# Patient Record
Sex: Female | Born: 1965 | Race: White | Hispanic: No | Marital: Married | Smoking: Never smoker
Health system: Southern US, Community
[De-identification: ages and names within clinical notes are randomized; demographics above are authoritative.]

## PROBLEM LIST (undated history)

## (undated) DIAGNOSIS — D6851 Activated protein C resistance: Secondary | ICD-10-CM

## (undated) DIAGNOSIS — R0602 Shortness of breath: Secondary | ICD-10-CM

## (undated) DIAGNOSIS — E079 Disorder of thyroid, unspecified: Secondary | ICD-10-CM

## (undated) DIAGNOSIS — E785 Hyperlipidemia, unspecified: Secondary | ICD-10-CM

## (undated) DIAGNOSIS — T7840XA Allergy, unspecified, initial encounter: Secondary | ICD-10-CM

## (undated) DIAGNOSIS — D689 Coagulation defect, unspecified: Secondary | ICD-10-CM

## (undated) DIAGNOSIS — I82409 Acute embolism and thrombosis of unspecified deep veins of unspecified lower extremity: Secondary | ICD-10-CM

## (undated) DIAGNOSIS — E538 Deficiency of other specified B group vitamins: Secondary | ICD-10-CM

## (undated) DIAGNOSIS — E039 Hypothyroidism, unspecified: Secondary | ICD-10-CM

## (undated) DIAGNOSIS — E559 Vitamin D deficiency, unspecified: Secondary | ICD-10-CM

## (undated) DIAGNOSIS — I82402 Acute embolism and thrombosis of unspecified deep veins of left lower extremity: Secondary | ICD-10-CM

## (undated) DIAGNOSIS — R7303 Prediabetes: Secondary | ICD-10-CM

## (undated) DIAGNOSIS — K760 Fatty (change of) liver, not elsewhere classified: Secondary | ICD-10-CM

## (undated) DIAGNOSIS — Z86718 Personal history of other venous thrombosis and embolism: Secondary | ICD-10-CM

## (undated) DIAGNOSIS — G500 Trigeminal neuralgia: Secondary | ICD-10-CM

## (undated) HISTORY — DX: Hypothyroidism, unspecified: E03.9

## (undated) HISTORY — DX: Acute embolism and thrombosis of unspecified deep veins of left lower extremity: I82.402

## (undated) HISTORY — DX: Deficiency of other specified B group vitamins: E53.8

## (undated) HISTORY — DX: Allergy, unspecified, initial encounter: T78.40XA

## (undated) HISTORY — DX: Trigeminal neuralgia: G50.0

## (undated) HISTORY — DX: Coagulation defect, unspecified: D68.9

## (undated) HISTORY — DX: Personal history of other venous thrombosis and embolism: Z86.718

## (undated) HISTORY — DX: Vitamin D deficiency, unspecified: E55.9

## (undated) HISTORY — DX: Hyperlipidemia, unspecified: E78.5

## (undated) HISTORY — DX: Disorder of thyroid, unspecified: E07.9

## (undated) HISTORY — DX: Prediabetes: R73.03

## (undated) HISTORY — DX: Fatty (change of) liver, not elsewhere classified: K76.0

## (undated) HISTORY — DX: Shortness of breath: R06.02

---

## 2009-05-11 ENCOUNTER — Emergency Department (HOSPITAL_COMMUNITY): Admission: EM | Admit: 2009-05-11 | Discharge: 2009-05-11 | Payer: Self-pay | Admitting: Emergency Medicine

## 2009-08-07 ENCOUNTER — Emergency Department (HOSPITAL_COMMUNITY): Admission: EM | Admit: 2009-08-07 | Discharge: 2009-08-07 | Payer: Self-pay | Admitting: Family Medicine

## 2010-01-20 ENCOUNTER — Ambulatory Visit: Payer: Self-pay | Admitting: Sports Medicine

## 2010-01-20 DIAGNOSIS — M79609 Pain in unspecified limb: Secondary | ICD-10-CM

## 2010-01-20 DIAGNOSIS — M7750 Other enthesopathy of unspecified foot: Secondary | ICD-10-CM | POA: Insufficient documentation

## 2010-02-18 ENCOUNTER — Emergency Department (HOSPITAL_COMMUNITY): Admission: EM | Admit: 2010-02-18 | Discharge: 2010-02-18 | Payer: Self-pay | Admitting: Family Medicine

## 2010-03-19 ENCOUNTER — Encounter (INDEPENDENT_AMBULATORY_CARE_PROVIDER_SITE_OTHER): Payer: Self-pay | Admitting: Emergency Medicine

## 2010-03-19 ENCOUNTER — Ambulatory Visit: Payer: Self-pay | Admitting: Surgery

## 2010-03-19 ENCOUNTER — Observation Stay (HOSPITAL_COMMUNITY): Admission: EM | Admit: 2010-03-19 | Discharge: 2010-03-19 | Payer: Self-pay | Admitting: Emergency Medicine

## 2010-03-20 ENCOUNTER — Emergency Department (HOSPITAL_COMMUNITY): Admission: EM | Admit: 2010-03-20 | Discharge: 2010-03-20 | Payer: Self-pay | Admitting: Emergency Medicine

## 2010-03-25 ENCOUNTER — Emergency Department (HOSPITAL_COMMUNITY): Admission: EM | Admit: 2010-03-25 | Discharge: 2010-03-25 | Payer: Self-pay | Admitting: Emergency Medicine

## 2010-03-25 ENCOUNTER — Encounter: Admission: RE | Admit: 2010-03-25 | Discharge: 2010-03-25 | Payer: Self-pay | Admitting: Internal Medicine

## 2010-04-07 ENCOUNTER — Encounter: Admission: RE | Admit: 2010-04-07 | Discharge: 2010-04-07 | Payer: Self-pay | Admitting: Internal Medicine

## 2010-05-13 ENCOUNTER — Encounter: Admission: RE | Admit: 2010-05-13 | Discharge: 2010-05-13 | Payer: Self-pay | Admitting: Dermatology

## 2010-05-20 ENCOUNTER — Encounter: Admission: RE | Admit: 2010-05-20 | Discharge: 2010-05-20 | Payer: Self-pay | Admitting: Dermatology

## 2010-09-30 ENCOUNTER — Ambulatory Visit: Payer: Self-pay | Admitting: Oncology

## 2010-10-19 ENCOUNTER — Ambulatory Visit
Admission: RE | Admit: 2010-10-19 | Discharge: 2010-10-19 | Payer: Self-pay | Source: Home / Self Care | Attending: Oncology | Admitting: Oncology

## 2010-10-19 ENCOUNTER — Ambulatory Visit (HOSPITAL_COMMUNITY)
Admission: RE | Admit: 2010-10-19 | Discharge: 2010-10-19 | Payer: Self-pay | Source: Home / Self Care | Attending: Oncology | Admitting: Oncology

## 2010-10-25 NOTE — Assessment & Plan Note (Signed)
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 Pcn  Physical Exam  General:  Well-developed,well-nourished,in no acute distress; alert,appropriate and cooperative throughout examination Msk:  Left foot shows collapse of transverse arch There is a large callus distal to MT 2,3 and 4 on left and not on RT Left forefoot wider than RT RT shows minimal transverse arch collapse mod high arches otherwise  TTP over callus area  running gait is very good with forefoot strike   Impression & Recommendations:  Problem # 1:  FOOT PAIN, LEFT (ICD-729.5) This is chronic for 1 year I think the treatments ahve all been reasonable and she has both good shoes and good orthotics  needs to cont icing  leg lengths were equal and strength good  Problem # 2:  METATARSALGIA (ICD-726.70) The challenge has been finding a MT padding position that will relieve her pain  with the forefoot strike I think the MT cookies don't provide enough lift and I think the current position is too far forward  trial for 1 mo with std MT pad w focus on 2 and 3 MT shafts place more proximally to change angle of forefoot strike  add MT pads to other shoes  REck in 1 month but if this strategy works she needs to [employer] and shoes are  fine

## 2010-12-08 ENCOUNTER — Encounter (HOSPITAL_BASED_OUTPATIENT_CLINIC_OR_DEPARTMENT_OTHER): Payer: 59 | Admitting: Oncology

## 2010-12-08 ENCOUNTER — Other Ambulatory Visit: Payer: Self-pay | Admitting: Oncology

## 2010-12-08 DIAGNOSIS — I824Z9 Acute embolism and thrombosis of unspecified deep veins of unspecified distal lower extremity: Secondary | ICD-10-CM

## 2010-12-12 LAB — POCT I-STAT, CHEM 8
Calcium, Ion: 1.18 mmol/L (ref 1.12–1.32)
Chloride: 104 mEq/L (ref 96–112)
Potassium: 4.4 mEq/L (ref 3.5–5.1)
Sodium: 139 mEq/L (ref 135–145)
TCO2: 28 mmol/L (ref 0–100)

## 2010-12-12 LAB — TSH: TSH: 1.844 u[IU]/mL (ref 0.350–4.500)

## 2010-12-13 LAB — D-DIMER, QUANTITATIVE: D-Dimer, Quant: 0.69 ug/mL-FEU — ABNORMAL HIGH (ref 0.00–0.48)

## 2010-12-13 LAB — PROTEIN S, TOTAL: Protein S Ag, Total: 113 % (ref 70–140)

## 2010-12-13 LAB — PROTEIN C ACTIVITY: Protein C Activity: 188 % — ABNORMAL HIGH (ref 75–133)

## 2010-12-13 LAB — ANTITHROMBIN III: AntiThromb III Func: 118 % (ref 76–126)

## 2010-12-13 LAB — PROTEIN S ACTIVITY: Protein S Activity: 99 % (ref 69–129)

## 2010-12-16 ENCOUNTER — Encounter (HOSPITAL_BASED_OUTPATIENT_CLINIC_OR_DEPARTMENT_OTHER): Payer: 59 | Admitting: Oncology

## 2010-12-16 DIAGNOSIS — D6859 Other primary thrombophilia: Secondary | ICD-10-CM

## 2010-12-16 DIAGNOSIS — I824Z9 Acute embolism and thrombosis of unspecified deep veins of unspecified distal lower extremity: Secondary | ICD-10-CM

## 2010-12-27 ENCOUNTER — Emergency Department (HOSPITAL_COMMUNITY)
Admission: EM | Admit: 2010-12-27 | Discharge: 2010-12-28 | Disposition: A | Discharge status: Home / Self Care | Payer: 59 | Attending: Emergency Medicine | Admitting: Emergency Medicine

## 2010-12-27 DIAGNOSIS — Z86718 Personal history of other venous thrombosis and embolism: Secondary | ICD-10-CM | POA: Insufficient documentation

## 2010-12-27 DIAGNOSIS — R109 Unspecified abdominal pain: Secondary | ICD-10-CM | POA: Insufficient documentation

## 2010-12-27 DIAGNOSIS — D6859 Other primary thrombophilia: Secondary | ICD-10-CM | POA: Insufficient documentation

## 2010-12-27 DIAGNOSIS — R300 Dysuria: Secondary | ICD-10-CM | POA: Insufficient documentation

## 2010-12-27 LAB — BASIC METABOLIC PANEL WITH GFR
BUN: 17 mg/dL (ref 6–23)
CO2: 28 meq/L (ref 19–32)
Calcium: 9.8 mg/dL (ref 8.4–10.5)
Chloride: 103 meq/L (ref 96–112)
Creatinine, Ser: 0.91 mg/dL (ref 0.4–1.2)
GFR calc non Af Amer: >60 mL/min
Glucose, Bld: 100 mg/dL — ABNORMAL HIGH (ref 70–99)
Potassium: 4 meq/L (ref 3.5–5.1)
Sodium: 139 meq/L (ref 135–145)

## 2010-12-27 LAB — DIFFERENTIAL
Basophils Absolute: 0 K/uL (ref 0.0–0.1)
Basophils Relative: 0 % (ref 0–1)
Eosinophils Absolute: 0.3 K/uL (ref 0.0–0.7)
Eosinophils Relative: 3 % (ref 0–5)
Lymphocytes Relative: 40 % (ref 12–46)
Lymphs Abs: 3.6 K/uL (ref 0.7–4.0)
Monocytes Absolute: 0.5 K/uL (ref 0.1–1.0)
Monocytes Relative: 6 % (ref 3–12)
Neutro Abs: 4.5 K/uL (ref 1.7–7.7)
Neutrophils Relative %: 50 % (ref 43–77)

## 2010-12-27 LAB — CBC
MCH: 29.3 pg (ref 26.0–34.0)
MCHC: 34.2 g/dL (ref 30.0–36.0)
MCV: 85.8 fL (ref 78.0–100.0)
Platelets: 209 10*3/uL (ref 150–400)
RBC: 4.09 MIL/uL (ref 3.87–5.11)

## 2010-12-27 LAB — POCT PREGNANCY, URINE: Preg Test, Ur: NEGATIVE

## 2010-12-28 ENCOUNTER — Emergency Department (HOSPITAL_COMMUNITY): Payer: 59

## 2010-12-28 LAB — POCT URINALYSIS DIP (DEVICE)
Nitrite: NEGATIVE
Specific Gravity, Urine: <=1.005 (ref 1.005–1.030)
Urobilinogen, UA: 0.2 mg/dL (ref 0.0–1.0)

## 2010-12-28 LAB — URINALYSIS, ROUTINE W REFLEX MICROSCOPIC
Bilirubin Urine: NEGATIVE
Protein, ur: NEGATIVE mg/dL
Specific Gravity, Urine: 1.01 (ref 1.005–1.030)
Urobilinogen, UA: 0.2 mg/dL (ref 0.0–1.0)
pH: 6 (ref 5.0–8.0)

## 2010-12-28 LAB — POCT PREGNANCY, URINE: Preg Test, Ur: NEGATIVE

## 2010-12-31 LAB — URINALYSIS, ROUTINE W REFLEX MICROSCOPIC
Ketones, ur: NEGATIVE mg/dL
Nitrite: POSITIVE — AB
Protein, ur: NEGATIVE mg/dL
Urobilinogen, UA: 0.2 mg/dL (ref 0.0–1.0)
pH: 6 (ref 5.0–8.0)

## 2010-12-31 LAB — URINE MICROSCOPIC-ADD ON

## 2010-12-31 LAB — URINE CULTURE: Colony Count: 60000

## 2011-02-17 ENCOUNTER — Ambulatory Visit
Admission: RE | Admit: 2011-02-17 | Discharge: 2011-02-17 | Disposition: A | Discharge status: Home / Self Care | Payer: 59 | Source: Ambulatory Visit | Attending: Internal Medicine | Admitting: Internal Medicine

## 2011-02-17 ENCOUNTER — Other Ambulatory Visit: Payer: Self-pay | Admitting: Internal Medicine

## 2011-02-17 DIAGNOSIS — R52 Pain, unspecified: Secondary | ICD-10-CM

## 2011-04-09 ENCOUNTER — Emergency Department (HOSPITAL_COMMUNITY)
Admission: EM | Admit: 2011-04-09 | Discharge: 2011-04-10 | Disposition: A | Discharge status: Home / Self Care | Payer: 59 | Attending: Emergency Medicine | Admitting: Emergency Medicine

## 2011-04-09 DIAGNOSIS — D6859 Other primary thrombophilia: Secondary | ICD-10-CM | POA: Insufficient documentation

## 2011-04-09 DIAGNOSIS — I824Z9 Acute embolism and thrombosis of unspecified deep veins of unspecified distal lower extremity: Secondary | ICD-10-CM | POA: Insufficient documentation

## 2011-04-09 DIAGNOSIS — Z86718 Personal history of other venous thrombosis and embolism: Secondary | ICD-10-CM | POA: Insufficient documentation

## 2011-04-09 DIAGNOSIS — R609 Edema, unspecified: Secondary | ICD-10-CM | POA: Insufficient documentation

## 2011-04-09 DIAGNOSIS — M79609 Pain in unspecified limb: Secondary | ICD-10-CM | POA: Insufficient documentation

## 2011-04-10 ENCOUNTER — Ambulatory Visit (HOSPITAL_COMMUNITY)
Admission: RE | Admit: 2011-04-10 | Discharge: 2011-04-10 | Disposition: A | Discharge status: Home / Self Care | Payer: 59 | Source: Ambulatory Visit | Attending: Emergency Medicine | Admitting: Emergency Medicine

## 2011-04-10 DIAGNOSIS — M79609 Pain in unspecified limb: Secondary | ICD-10-CM

## 2011-09-13 ENCOUNTER — Telehealth: Payer: Self-pay | Admitting: *Deleted

## 2011-09-13 NOTE — Telephone Encounter (Signed)
Per Dr. Gaylyn Rong,  Pt has no indications from his stand point to be excused from jury duty.   Called pt back and suggested she take breaks as permitted and can move/stretch her legs while sitting in chair if needed.  She verbalized understanding.

## 2011-09-13 NOTE — Telephone Encounter (Signed)
Pt left VM asking if ok for her to sit on Jury Duty all day considering history of blood clot last year?   Asks if Dr. Gaylyn Rong does not feel it is safe,  Can she have a letter to excuse her from [employer]?  Note forwarded to Dr. Gaylyn Rong for reply.

## 2011-11-16 ENCOUNTER — Encounter (HOSPITAL_COMMUNITY): Payer: Self-pay | Admitting: *Deleted

## 2011-11-16 ENCOUNTER — Emergency Department (HOSPITAL_COMMUNITY)
Admission: EM | Admit: 2011-11-16 | Discharge: 2011-11-16 | Disposition: A | Discharge status: Home / Self Care | Payer: 59 | Attending: Emergency Medicine | Admitting: Emergency Medicine

## 2011-11-16 DIAGNOSIS — Z86718 Personal history of other venous thrombosis and embolism: Secondary | ICD-10-CM | POA: Insufficient documentation

## 2011-11-16 DIAGNOSIS — D6859 Other primary thrombophilia: Secondary | ICD-10-CM | POA: Insufficient documentation

## 2011-11-16 DIAGNOSIS — M79609 Pain in unspecified limb: Secondary | ICD-10-CM | POA: Insufficient documentation

## 2011-11-16 DIAGNOSIS — M79606 Pain in leg, unspecified: Secondary | ICD-10-CM

## 2011-11-16 HISTORY — DX: Activated protein C resistance: D68.51

## 2011-11-16 HISTORY — DX: Acute embolism and thrombosis of unspecified deep veins of unspecified lower extremity: I82.409

## 2011-11-16 LAB — POCT I-STAT, CHEM 8
BUN: 17 mg/dL (ref 6–23)
Hemoglobin: 11.9 g/dL — ABNORMAL LOW (ref 12.0–15.0)
Potassium: 4.2 mEq/L (ref 3.5–5.1)
Sodium: 144 mEq/L (ref 135–145)
TCO2: 28 mmol/L (ref 0–100)

## 2011-11-16 LAB — DIFFERENTIAL
Basophils Absolute: 0 10*3/uL (ref 0.0–0.1)
Eosinophils Absolute: 0.2 10*3/uL (ref 0.0–0.7)
Eosinophils Relative: 2 % (ref 0–5)
Lymphocytes Relative: 44 % (ref 12–46)
Neutrophils Relative %: 47 % (ref 43–77)

## 2011-11-16 LAB — CBC
MCV: 86.2 fL (ref 78.0–100.0)
Platelets: 225 10*3/uL (ref 150–400)
RBC: 3.99 MIL/uL (ref 3.87–5.11)
RDW: 14.2 % (ref 11.5–15.5)
WBC: 7 10*3/uL (ref 4.0–10.5)

## 2011-11-16 MED ORDER — ENOXAPARIN SODIUM 100 MG/ML ~~LOC~~ SOLN
100.0000 mg | SUBCUTANEOUS | Status: AC
  Administered 2011-11-16: 100 mg via SUBCUTANEOUS
  Filled 2011-11-16: qty 1

## 2011-11-16 MED ORDER — ENOXAPARIN SODIUM 150 MG/ML ~~LOC~~ SOLN: 1.0000 mg/kg | Freq: Once | SUBCUTANEOUS | Status: DC

## 2011-11-16 NOTE — ED Notes (Signed)
Pt with hx of DVT is here for pain behind left knee which feels just like when she had DVT in the past.  Pt denies any swelling to left or sob or cp.  This has been increasing for the past several days

## 2011-11-16 NOTE — ED Notes (Signed)
Secretary notified me that no venous dopplers can be done after 7

## 2011-11-16 NOTE — ED Provider Notes (Signed)
History     CSN: [id number]  Arrival date & time 11/16/11  1845   First MD Initiated Contact with Patient 11/16/11 2037      Chief Complaint  Patient presents with  . DVT     HPI  History provided by the patient. Patient is a 46 year old female with past history of DVT who presents with complaints of left lower extremity pain for the past 2 days. Patient reports having pain in the popliteal fossa area of her left leg feels similar to previous DVT 1-1/2 years ago. Pain is described as a soreness and ache is worse with walking and palpation. Pain is sometimes improved with rest and lying still. She denies having any swelling of the leg, numbness or tingling. Patient denies any other aggravating or alleviating factors. Symptoms are described as moderate. Patient reports having exact same presentation with a DVT previously. Patient was formally on Coumadin for 6 months but has since been off. Patient denies any other symptoms. She denies any chest pain, shortness of breath, hemoptysis, or lightheadedness. Patient denies any injury or trauma to her leg. 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    Past Medical History  Diagnosis Date  . DVT (deep venous thrombosis)   . Factor V Leiden     Past Surgical History  Procedure Date  . Cesarean section     No family history on file.  History  Substance Use Topics  . Smoking status: Not on file  . Smokeless tobacco: Not on file  . Alcohol Use: No    OB History    Grav Para Term Preterm Abortions TAB SAB Ect Mult Living                  Review of Systems  Constitutional: Negative for fever and chills.  Respiratory: Negative for cough and shortness of breath.   Cardiovascular: Negative for chest pain and palpitations.  All other systems reviewed and are negative.    Allergies  Penicillins  Home Medications   Current Outpatient Rx  Name Route Sig Dispense Refill  . ASPIRIN 81 MG PO CHEW Oral Chew 81 mg  by mouth daily.    Marland Kitchen FEXOFENADINE HCL 30 MG PO TABS Oral Take 30 mg by mouth 2 (two) times daily.      BP 133/81  Pulse 83  Temp(Src) 98.1 F (36.7 C) (Oral)  Resp 20  SpO2 98%  LMP 11/13/2011  Physical Exam  Nursing note and vitals reviewed. Constitutional: She is oriented to person, place, and time. She appears well-developed and well-nourished. No distress.  HENT:  Head: Normocephalic and atraumatic.  Cardiovascular: Normal rate and regular rhythm.   No murmur heard. Pulmonary/Chest: Effort normal and breath sounds normal. No respiratory distress. She has no wheezes. She has no rales.  Abdominal: Soft.  Musculoskeletal: Normal range of motion. She exhibits tenderness. She exhibits no edema.       Tenderness to palpation in the left popliteal fossa. No masses or lesions. Skin appears normal with no induration. No tenderness to palpation along For lower leg. Normal dorsal pedal pulses and sensations and foot. No skin changes or erythema. Negative Holman sign. No increased swelling of lower extremity.  Neurological: She is alert and oriented to person, place, and time.  Skin: Skin is warm and dry. No rash noted.  Psychiatric: She has a normal mood and affect. Her behavior is normal.    ED Course  Procedures  Results for orders  placed during the hospital encounter of 11/16/11  CBC      Component Value Range   WBC 7.0  4.0 - 10.5 (K/uL)   RBC 3.99  3.87 - 5.11 (MIL/uL)   Hemoglobin 11.7 (*) 12.0 - 15.0 (g/dL)   HCT 46.9 (*) 62.9 - 46.0 (%)   MCV 86.2  78.0 - 100.0 (fL)   MCH 29.3  26.0 - 34.0 (pg)   MCHC 34.0  30.0 - 36.0 (g/dL)   RDW 52.8  41.3 - 24.4 (%)   Platelets 225  150 - 400 (K/uL)  DIFFERENTIAL      Component Value Range   Neutrophils Relative 47  43 - 77 (%)   Neutro Abs 3.3  1.7 - 7.7 (K/uL)   Lymphocytes Relative 44  12 - 46 (%)   Lymphs Abs 3.1  0.7 - 4.0 (K/uL)   Monocytes Relative 6  3 - 12 (%)   Monocytes Absolute 0.4  0.1 - 1.0 (K/uL)   Eosinophils  Relative 2  0 - 5 (%)   Eosinophils Absolute 0.2  0.0 - 0.7 (K/uL)   Basophils Relative 0  0 - 1 (%)   Basophils Absolute 0.0  0.0 - 0.1 (K/uL)  POCT I-STAT, CHEM 8      Component Value Range   Sodium 144  135 - 145 (mEq/L)   Potassium 4.2  3.5 - 5.1 (mEq/L)   Chloride 105  96 - 112 (mEq/L)   BUN 17  6 - 23 (mg/dL)   Creatinine, Ser 0.10 (*) 0.50 - 1.10 (mg/dL)   Glucose, Bld 272 (*) 70 - 99 (mg/dL)   Calcium, Ion 5.36  6.44 - 1.32 (mmol/L)   TCO2 28  0 - 100 (mmol/L)   Hemoglobin 11.9 (*) 12.0 - 15.0 (g/dL)   HCT 03.4 (*) 74.2 - 46.0 (%)      1. Lower extremity pain       MDM  8:35 PM patient seen and evaluated. Patient in no acute distress.   Patient given dose of Lovenox and scheduled for outpatient lower extremity Doppler. Patient is reliable and will plan to followup as instructed. She agrees with this plan.       Angus Seller, [place] 11/17/11 [phone]

## 2011-11-16 NOTE — Discharge Instructions (Signed)
You were seen and evaluated today for your symptoms of left knee and lower leg pains. At this time your providers feel your symptoms could represent another blood clot or DVT in your leg. You were given a dose of Lovenox, a blood thinner medicine to treat this possibility. You have also been scheduled to have an ultrasound study of your lower leg for further evaluation of blood clots. You should receive a phone call tomorrow with your appointment time. Please call your primary doctor to inform them of your symptoms and appointment. If you do not hear from the vascular laboratory please call their office at [phone]. If you develop any worsening symptoms, develop chest pains, shortness of breath or coughing up blood and please return to emergency room immediately.    Deep Vein Thrombosis  A deep vein thrombosis (DVT) is a blood clot (thrombus) that develops in a deep vein. A DVT is a clot in the deep, larger veins of the leg, arm, or pelvis. These are more dangerous than clots that might form in veins on the surface of the body. Deep vein thrombosis can lead to complications if the clot breaks off and travels in the bloodstream to the lungs. CAUSES Blood clots form in a vein for different reasons. Usually several things cause blood clots. They include:  The flow of blood slows down.   The inside of the vein is damaged in some way.   The person has a condition that makes blood clot more easily. These conditions may include:   Older age (especially over 5 years old).   Having a history of blood clots.   Having major or lengthy surgery. Hip surgery is particularly high-risk.   Breaking a hip or leg.   Sitting or lying still for a long time.   Cancer or cancer treatment.   Having a long, thin tube (catheter) placed inside a vein during a medical procedure.   Being overweight (obese).   Pregnancy and childbirth.   Medicines with estrogen.   Smoking.   Other circulation or heart  problems.  SYMPTOMS When a clot forms, it can either partially or totally block the blood flow in that vein. Symptoms of a DVT can include:  Swelling of the leg or arm, especially if one side is much worse.   Warmth and redness of the leg or arm, especially if one side is much worse.   Pain in an arm or leg. If the clot is in the leg, symptoms may be more noticeable or worse when standing or walking.  If the blood clot travels to the lung, it may cause:  Shortness of breath.   Chest pain. The pain may be worsened by deep breaths.   Coughing up thick mucus (phlegm), possibly flecked with blood.  Anyone with these symptoms should get emergency medical treatment right away. Call your local emergency services (911 in [place] if you have these symptoms. DIAGNOSIS If a DVT is suspected, your caregiver will take a full medical history. He or she will also perform a physical exam. Tests that also may be required include:  Studies of the clotting properties of the blood.   An ultrasound scan.   X-rays to show the flow of blood when special dye is injected into the veins (venography).   Studies of your lungs if you have any chest symptoms.  PREVENTION  Exercise the legs regularly. Take a brisk 30 minute walk every day.   Maintain a weight that is appropriate for your  height.   Avoid sitting or lying in bed for long periods of time without moving your legs.   Women, particularly those over the age of 110, should consider the risks and benefits of taking estrogen medicines, including birth control pills.   Do not smoke, especially if you take estrogen medicines.   Long-distance travel can increase your risk. You should exercise your legs by walking or pumping the muscles every hour.   In hospital prevention:   Prevention may include medical and nonmedical measures.  TREATMENT  The most common treatment for DVT is blood thinning (anticoagulant) medicine, which reduces the blood's  tendency to clot. Anticoagulants can stop new blood clots from forming and old ones from growing. They cannot dissolve existing clots. Your body does this by itself over time. Anticoagulants can be given by mouth, by intravenous (IV) access, or by injection. Your caregiver will determine the best program for you.   Less commonly, clot-dissolving drugs (thrombolytics) are used to dissolve a DVT. They carry a high risk of bleeding, so they are used mainly in severe cases.   Very rarely, a blood clot in the leg needs to be removed surgically.   If you are unable to take anticoagulants, your caregiver may arrange for you to have a filter placed in a main vein in your belly (abdomen). This filter prevents clots from traveling to your lungs.  HOME CARE INSTRUCTIONS  Take all medicines prescribed by your caregiver. Follow the directions carefully.   You will most likely continue taking anticoagulants after you leave the hospital. Your caregiver will advise you on the length of treatment (usually 3 to 6 months, sometimes for life).   Taking too much or too little of an anticoagulant is dangerous. While taking this type of medicine, you will need to have regular blood tests to be sure the dose is correct. The dose can change for many reasons. It is critically important that you take this medicine exactly as prescribed, and that you have blood tests exactly as directed.   Many foods can interfere with anticoagulants. These include foods high in vitamin K, such as spinach, kale, broccoli, cabbage, collard and turnip greens, Brussels sprouts, peas, cauliflower, seaweed, parsley, beef and pork liver, green tea, and soybean oil. Your caregiver should discuss limits on these foods with you or you should arrange a visit with a dietician to answer your questions.   Many medicines can interfere with anticoagulants. You must tell your caregiver about any and all medicines you take. This includes all vitamins and  supplements. Be especially cautious with aspirin and anti-inflammatory medicines. Ask your caregiver before taking these.   Anticoagulants can have side effects, mostly excessive bruising or bleeding. You will need to hold pressure over cuts for longer than usual. Avoid alcoholic drinks or consume only very small amounts while taking this medicine.   If you are taking an anticoagulant:   Wear a medical alert bracelet.   Notify your dentist or other caregivers before procedures.   Avoid contact sports.   Ask your caregiver how soon you can go back to normal activities. Not being active can lead to new clots. Ask for a list of what you should and should not do.   Exercise your lower leg muscles. This is important while traveling.   You may need to wear compression stockings. These are tight elastic stockings that apply pressure to the lower legs. This can help keep the blood in the legs from clotting.   If  you are a smoker, you should quit.   Learn as much as you can about DVT.  SEEK MEDICAL CARE IF:  You have unusual bruising or any bleeding problems.   The swelling or pain in your affected arm or leg is not gradually improving.   You anticipate surgery or long-distance travel. You should get specific advice on DVT prevention.   You discover other family members with blood clots. This may require further testing for inherited diseases or conditions.  SEEK IMMEDIATE MEDICAL CARE IF:  You develop chest pain.   You develop severe shortness of breath.   You begin to cough up bloody mucus or phlegm (sputum).   You feel dizzy or faint.   You develop swelling or pain in the leg.   You have breathing problems after traveling.  MAKE SURE YOU:  Understand these instructions.   Will watch your condition.   Will get help right away if you are not doing well or get worse.  Document Released: 09/11/2005 Document Revised: 05/24/2011 Document Reviewed: 11/03/2010 [facility] Patient  Information 2012 [place], [place].

## 2011-11-16 NOTE — ED Notes (Signed)
Pt given vascular lab copy and notified to bring it to vascular lab; pt is aware to come to vascular lab between 8-830 am; pt verbalized understanding

## 2011-11-17 ENCOUNTER — Ambulatory Visit (HOSPITAL_COMMUNITY)
Admission: RE | Admit: 2011-11-17 | Discharge: 2011-11-17 | Disposition: A | Discharge status: Home / Self Care | Payer: Managed Care, Other (non HMO) | Source: Ambulatory Visit | Attending: Emergency Medicine | Admitting: Emergency Medicine

## 2011-11-17 DIAGNOSIS — M79609 Pain in unspecified limb: Secondary | ICD-10-CM | POA: Insufficient documentation

## 2011-11-17 NOTE — Progress Notes (Signed)
*  PRELIMINARY RESULTS* Vascular Ultrasound Left lower extremity venous duplex has been completed.  Preliminary findings: Left= No evidence of DVT or baker's cyst.  Farrel Demark RDMS 11/17/2011, 8:42 AM

## 2011-11-18 NOTE — ED Provider Notes (Signed)
Medical screening examination/treatment/procedure(s) were performed by non-physician practitioner and as supervising physician I was immediately available for consultation/collaboration.  Flint Melter, MD 11/18/11 [phone]

## 2012-04-22 ENCOUNTER — Ambulatory Visit (HOSPITAL_COMMUNITY)
Admission: RE | Admit: 2012-04-22 | Discharge: 2012-04-22 | Disposition: A | Discharge status: Home / Self Care | Payer: Managed Care, Other (non HMO) | Source: Ambulatory Visit | Attending: Internal Medicine | Admitting: Internal Medicine

## 2012-04-22 DIAGNOSIS — M79606 Pain in leg, unspecified: Secondary | ICD-10-CM

## 2012-04-22 DIAGNOSIS — M79609 Pain in unspecified limb: Secondary | ICD-10-CM | POA: Insufficient documentation

## 2012-04-22 DIAGNOSIS — M7989 Other specified soft tissue disorders: Secondary | ICD-10-CM

## 2012-04-22 NOTE — Progress Notes (Signed)
VASCULAR LAB PRELIMINARY  PRELIMINARY  PRELIMINARY  PRELIMINARY  Left lower extremity venous Doppler completed.    Preliminary report:  There is no DVT or SVT noted in the left lower extremity.  Maria Glenn, 04/22/2012, 4:04 PM

## 2012-08-11 ENCOUNTER — Emergency Department (HOSPITAL_COMMUNITY)
Admission: EM | Admit: 2012-08-11 | Discharge: 2012-08-11 | Disposition: A | Discharge status: Home / Self Care | Payer: Managed Care, Other (non HMO) | Attending: Emergency Medicine | Admitting: Emergency Medicine

## 2012-08-11 ENCOUNTER — Encounter (HOSPITAL_COMMUNITY): Payer: Self-pay | Admitting: Emergency Medicine

## 2012-08-11 DIAGNOSIS — Z872 Personal history of diseases of the skin and subcutaneous tissue: Secondary | ICD-10-CM | POA: Insufficient documentation

## 2012-08-11 DIAGNOSIS — Z86718 Personal history of other venous thrombosis and embolism: Secondary | ICD-10-CM | POA: Insufficient documentation

## 2012-08-11 DIAGNOSIS — M79609 Pain in unspecified limb: Secondary | ICD-10-CM

## 2012-08-11 DIAGNOSIS — Z79899 Other long term (current) drug therapy: Secondary | ICD-10-CM | POA: Insufficient documentation

## 2012-08-11 DIAGNOSIS — Z7982 Long term (current) use of aspirin: Secondary | ICD-10-CM | POA: Insufficient documentation

## 2012-08-11 NOTE — ED Notes (Signed)
Pt c/o left leg pain x 1 week. Pt has history of DVT. Pt describes pain as cramping. No swelling noted.

## 2012-08-11 NOTE — Progress Notes (Signed)
VASCULAR LAB PRELIMINARY  PRELIMINARY  PRELIMINARY  PRELIMINARY  Left lower extremity venous Doppler completed.    Preliminary report:  There is no DVT or SVT noted in the left lower extremity.  Maria Glenn, 08/11/2012, 7:32 PM

## 2012-08-11 NOTE — ED Provider Notes (Signed)
History     CSN: [id number]  Arrival date & time 08/11/12  1538   First MD Initiated Contact with Patient 08/11/12 1720      Chief Complaint  Patient presents with  . Leg Pain    HPI Patient presents with concerns of left leg pain.  The pain has been there for approximately one week, intermittently worsening.  No clear exacerbating or alleviating factors.  The pain is dull, aching, largely in the popliteal area.  There is no associated dyspnea, chest pain, fevers, chills.  Minimal superficial changes. Notably, the patient has factor V deficiency, and previous DVT in the same leg.  She's not currently taking anticoagulants. Past Medical History  Diagnosis Date  . DVT (deep venous thrombosis)   . Factor V Leiden     Past Surgical History  Procedure Date  . Cesarean section     No family history on file.  History  Substance Use Topics  . Smoking status: Never Smoker   . Smokeless tobacco: Not on file  . Alcohol Use: No    OB History    Grav Para Term Preterm Abortions TAB SAB Ect Mult Living                  Review of Systems  Constitutional:       Per HPI, otherwise negative  HENT:       Per HPI, otherwise negative  Eyes: Negative.   Respiratory:       Per HPI, otherwise negative  Cardiovascular:       Per HPI, otherwise negative  Gastrointestinal: Negative for vomiting.  Genitourinary: Negative.   Musculoskeletal:       Per HPI, otherwise negative  Skin: Negative.   Neurological: Negative for syncope.    Allergies  Penicillins  Home Medications   Current Outpatient Rx  Name  Route  Sig  Dispense  Refill  . ASPIRIN 81 MG PO CHEW   Oral   Chew 81 mg by mouth daily.         Marland Kitchen FEXOFENADINE HCL 30 MG PO TABS   Oral   Take 30 mg by mouth daily.            BP 149/76  Pulse 74  Temp 98.3 F (36.8 C) (Oral)  Resp 16  SpO2 99%  LMP 08/04/2012  Physical Exam  Nursing note and vitals reviewed. Constitutional: She is oriented to person,  place, and time. She appears well-developed and well-nourished. No distress.  HENT:  Head: Normocephalic and atraumatic.  Eyes: Conjunctivae normal and EOM are normal.  Cardiovascular: Normal rate and regular rhythm.   Pulmonary/Chest: Effort normal and breath sounds normal. No stridor. No respiratory distress.  Abdominal: She exhibits no distension.  Musculoskeletal: She exhibits no edema.       Right hip: Normal.       Left hip: Normal.       Right knee: Normal.       Right ankle: Normal.       Left ankle: Normal.       Legs: Neurological: She is alert and oriented to person, place, and time. No cranial nerve deficit.  Skin: Skin is warm and dry.  Psychiatric: She has a normal mood and affect.    ED Course  Procedures (including critical care time)  Labs Reviewed - No data to display No results found.   No diagnosis found.    MDM  This generally well-appearing female with history of DVT, factor  V Leiden deficiency now presents with concerns of ongoing left leg pain.  On exam she is in no distress.  There is minimal discomfort with palpation, though given her history of DVT she had an ultrasound performed.  This was negative.  She was discharged in stable condition with orthopedics and PMD followup.  Gerhard Munch, MD 08/11/12 2002

## 2012-08-11 NOTE — ED Notes (Signed)
Pt states her left calf started hurting about a week ago. Describes pain as a sharp shooting pain that radiates from calf to left buttocks. She also states that her left shin and behind her left hurts as well describes that pain as a tingling sensation. Pt rates pain a 2/10 but she was concerned b/c she had a blood clot in the same leg 44yrs ago.

## 2014-08-11 LAB — HM PAP SMEAR: HM Pap smear: NEGATIVE

## 2015-03-08 LAB — HEPATIC FUNCTION PANEL
ALK PHOS: 53 U/L (ref 25–125)
ALT: 14 U/L (ref 7–35)
AST: 16 U/L (ref 13–35)
BILIRUBIN, TOTAL: 0.4 mg/dL

## 2015-03-08 LAB — BASIC METABOLIC PANEL
BUN: 11 mg/dL (ref 4–21)
CREATININE: 0.9 mg/dL (ref 0.5–1.1)
GLUCOSE: 92 mg/dL
Potassium: 5.2 mmol/L (ref 3.4–5.3)
Sodium: 141 mmol/L (ref 137–147)

## 2015-03-08 LAB — TSH: TSH: 3.33 u[IU]/mL (ref 0.41–5.90)

## 2015-03-08 LAB — LIPID PANEL
Cholesterol: 238 mg/dL — AB (ref 0–200)
HDL: 61 mg/dL (ref 35–70)
LDL Cholesterol: 165 mg/dL
TRIGLYCERIDES: 60 mg/dL (ref 40–160)

## 2015-03-08 LAB — CBC AND DIFFERENTIAL
HCT: 41 % (ref 36–46)
Hemoglobin: 13.2 g/dL (ref 12.0–16.0)
Platelets: 205 10*3/uL (ref 150–399)
WBC: 5 10*3/mL

## 2015-08-09 ENCOUNTER — Encounter: Payer: Self-pay | Admitting: Hematology & Oncology

## 2015-08-09 ENCOUNTER — Ambulatory Visit (HOSPITAL_BASED_OUTPATIENT_CLINIC_OR_DEPARTMENT_OTHER): Payer: Managed Care, Other (non HMO) | Admitting: Hematology & Oncology

## 2015-08-09 ENCOUNTER — Ambulatory Visit: Payer: Managed Care, Other (non HMO)

## 2015-08-09 ENCOUNTER — Ambulatory Visit (HOSPITAL_BASED_OUTPATIENT_CLINIC_OR_DEPARTMENT_OTHER): Payer: Managed Care, Other (non HMO)

## 2015-08-09 VITALS — BP 134/49 | HR 60 | Temp 98.2°F | Resp 16 | Ht 67.0 in | Wt 195.0 lb

## 2015-08-09 DIAGNOSIS — I82402 Acute embolism and thrombosis of unspecified deep veins of left lower extremity: Secondary | ICD-10-CM

## 2015-08-09 DIAGNOSIS — Z86718 Personal history of other venous thrombosis and embolism: Secondary | ICD-10-CM | POA: Diagnosis not present

## 2015-08-09 DIAGNOSIS — D6851 Activated protein C resistance: Secondary | ICD-10-CM

## 2015-08-09 DIAGNOSIS — I82532 Chronic embolism and thrombosis of left popliteal vein: Secondary | ICD-10-CM

## 2015-08-09 HISTORY — DX: Acute embolism and thrombosis of unspecified deep veins of left lower extremity: I82.402

## 2015-08-09 HISTORY — DX: Personal history of other venous thrombosis and embolism: Z86.718

## 2015-08-09 LAB — CBC WITH DIFFERENTIAL (CANCER CENTER ONLY)
BASO#: 0 10*3/uL (ref 0.0–0.2)
BASO%: 0.3 % (ref 0.0–2.0)
EOS%: 3.8 % (ref 0.0–7.0)
Eosinophils Absolute: 0.2 10*3/uL (ref 0.0–0.5)
HCT: 37.8 % (ref 34.8–46.6)
HEMOGLOBIN: 12.5 g/dL (ref 11.6–15.9)
LYMPH#: 2.4 10*3/uL (ref 0.9–3.3)
LYMPH%: 38.1 % (ref 14.0–48.0)
MCH: 28.5 pg (ref 26.0–34.0)
MCHC: 33.1 g/dL (ref 32.0–36.0)
MCV: 86 fL (ref 81–101)
MONO#: 0.4 10*3/uL (ref 0.1–0.9)
MONO%: 5.7 % (ref 0.0–13.0)
NEUT%: 52.1 % (ref 39.6–80.0)
NEUTROS ABS: 3.3 10*3/uL (ref 1.5–6.5)
Platelets: 192 10*3/uL (ref 145–400)
RBC: 4.38 10*6/uL (ref 3.70–5.32)
RDW: 14.3 % (ref 11.1–15.7)
WBC: 6.3 10*3/uL (ref 3.9–10.0)

## 2015-08-09 NOTE — Progress Notes (Signed)
Referral MD  Reason for Referral: History of DVT of the left lower leg. 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 Follow-up  : I had a blood clot in my leg. I am going on another trip and did not want another one.  HPI: Maria Glenn is a very charming 49 year old white female. She's been in good health. She has 1 son. She has never had any other pregnancies. Her son was born via C-section.  Back in 2011, she developed a blood clot in the left leg. The blood clot seen to be in the popliteal or posterior tibial vein. This apparently happened after she took a trip down to [place] where she is from.  She was on Coumadin for 6 months. Afterwards, she did have a Doppler done. 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  She is on one baby aspirin a day right now.  She and her family are going to [place] over [name]. She wants to be sure that she does not get another blood clot.  She does not smoke. She really does not drink. She stays active.  She has had no problems with fever. She's had no chest wall pain. There has been no swelling in the left leg.  She's had no nausea or vomiting. His been no change in bowel or bladder habits. She has get her routine mammograms. Patient does have her monthly cycles. She is not on any type of estrogen.  Overall, her performance status is ECOG 0.                Past Medical History  Diagnosis Date  . DVT (deep venous thrombosis) ([place])   . Factor V Leiden [facility])   :  Past Surgical History  Procedure Laterality Date  . Cesarean section    :   Current outpatient prescriptions:  .  aspirin 81 MG chewable tablet, Chew 81 mg by mouth daily., Disp: , Rfl:  .  Cetirizine HCl 10 MG CAPS, Take 10 mg by mouth., Disp: , Rfl: :  :  Allergies  Allergen Reactions  . Penicillins     Trouble breathing   :  No family history on  file.:  Social History   Social History  . Marital Status: Married    Spouse Name: N/A  . Number of Children: N/A  . Years of Education: N/A   Occupational History  . Not on file.   Social History Main Topics  . Smoking status: Never Smoker   . Smokeless tobacco: Not on file  . Alcohol Use: No  . Drug Use: No  . Sexual Activity: Not on file   Other Topics Concern  . Not on file   Social History Narrative  :  Pertinent items are noted in HPI.  Exam: @IPVITALS @  well-developed and well-nourished white female in no obvious distress. Her vital signs show temperature of 98.2. Pulse 60. Blood pressure 134/49. Weight is 195 pounds. Head and neck exam shows no ocular or oral lesions. There are no palpable cervical or supraclavicular lymph nodes. Lungs are clear to percussion and auscultation bilaterally. Cardiac exam regular rate and rhythm with no murmurs, rubs or bruits. Abdomen is soft. She has good bowel sounds. There is no fluid wave. There is no palpable liver or spleen tip. Back exam shows no tenderness over the spine, ribs or hips. Extremities shows no clubbing, cyanosis or edema.  She has no palpable venous cord in her legs. She has a negative Homans sign in her lower legs. Skin exam shows no rashes, ecchymoses or petechia. Neurological exam shows no focal neurological deficits.    Recent Labs  08/09/15 1020  WBC 6.3  HGB 12.5  HCT 37.8  PLT 192   No results for input(s): NA, K, CL, CO2, GLUCOSE, BUN, CREATININE, CALCIUM in the last 72 hours.  Blood smear review:  None  Pathology: None     Assessment and Plan:  Maria Glenn is a 49 year old white female with a past history of a left lower extremity thrombus. She is carrier for factor V Leiden. It sounds like she is heterozygous for this. I will have to repeat her labs as I have looked in our medical record system and cannot find anything.  I told her that she should be okay traveling down to [place]. However, I really  think that she will need a compression stocking for the left leg. I gave her a prescription for a measured compression stocking.  I told her to take a full dose aspirin 1 week prior to her vacation and to continue full dose aspirin a week after she comes back. After that, and she is to take 2 baby aspirin a day.  I told her to make sure that she takes aspirin with food.  I also told her to make sure she drinks a lot of water.  I really don't think we had to see her back unless she does develop a thrombus. I think the chance of her developing thrombus hopefully will be less than 10%.  I spent about 45 minutes with her today. She is very nice. It was fun talking to her.

## 2015-08-12 LAB — RFX DRVVT SCR W/RFLX CONF 1:1 MIX: dRVVT Screen: 31 s (ref <=45)

## 2015-08-12 LAB — HYPERCOAGULABLE PANEL, COMPREHENSIVE
ANTITHROMB III FUNC: 116 %{activity} (ref 80–120)
Anticardiolipin IgA: <11 [APL'U]
Beta-2-Glycoprotein I IgA: <9 SAU (ref <=20)
Beta-2-Glycoprotein I IgM: 9 SMU (ref <=20)
PROTEIN C ANTIGEN: 146 % — AB (ref 70–140)
PROTEIN S ANTIGEN, TOTAL: 91 % (ref 70–140)
Protein C Activity: 172 % (ref 70–180)
Protein S Activity: 80 % (ref 60–140)

## 2015-08-12 LAB — RFX PTT-LA W/RFX TO HEX PHASE CONF: PTT-LA SCREEN: 31 s (ref <=40)

## 2015-08-13 ENCOUNTER — Telehealth: Payer: Self-pay | Admitting: Nurse Practitioner

## 2015-08-13 NOTE — Telephone Encounter (Addendum)
Pt verbalized understanding and appreciation.   ----- Message from Volanda Napoleon, MD sent at 08/12/2015  6:14 PM EST ----- Please call and tell her that the blood clotting studies only show that she has 1 copy of the Factor V Leiden gene. This was expected!!  Have a great time in [place]!!!  Have a BIG steak for me!!! pete

## 2015-09-15 ENCOUNTER — Ambulatory Visit: Payer: Managed Care, Other (non HMO)

## 2015-09-15 ENCOUNTER — Ambulatory Visit: Payer: Managed Care, Other (non HMO) | Admitting: Hematology & Oncology

## 2015-09-15 ENCOUNTER — Other Ambulatory Visit: Payer: Managed Care, Other (non HMO)

## 2016-05-21 ENCOUNTER — Encounter (HOSPITAL_COMMUNITY): Payer: Self-pay | Admitting: *Deleted

## 2016-05-21 ENCOUNTER — Emergency Department (HOSPITAL_COMMUNITY)
Admission: EM | Admit: 2016-05-21 | Discharge: 2016-05-21 | Disposition: A | Discharge status: Home / Self Care | Payer: 59 | Attending: Emergency Medicine | Admitting: Emergency Medicine

## 2016-05-21 DIAGNOSIS — M7989 Other specified soft tissue disorders: Secondary | ICD-10-CM | POA: Diagnosis present

## 2016-05-21 DIAGNOSIS — Z7982 Long term (current) use of aspirin: Secondary | ICD-10-CM | POA: Diagnosis not present

## 2016-05-21 MED ORDER — ENOXAPARIN SODIUM 120 MG/0.8ML ~~LOC~~ SOLN
1.0000 mg/kg | Freq: Once | SUBCUTANEOUS | Status: AC
  Administered 2016-05-21: 115 mg via SUBCUTANEOUS
  Filled 2016-05-21: qty 0.8

## 2016-05-21 NOTE — Discharge Instructions (Signed)
Please return in the morning. You will need to go to the Cardiovascular lab for a DVT study before 9:30 AM. Return for the reasons we discussed.

## 2016-05-21 NOTE — ED Provider Notes (Signed)
[facility] DEPT Provider Note   CSN: [id number] Arrival date & time: 05/21/16  2027   By signing my name below, I, Maria Glenn, attest that this documentation has been prepared under the direction and in the presence of Margarita Mail, PA-C. Electronically Signed: Estanislado Glenn, Scribe. 05/21/2016. 9:12 PM.   History   Chief Complaint Chief Complaint  Patient presents with  . Leg Swelling     The history is provided by the patient. No language interpreter was used.   HPI Comments:  Maria Glenn is a 50 y.o. female with PMHx of L-leg DVT who presents to the Emergency Department complaining of sudden onset, worsening L leg swelling x 2 days. Pt states that the swelling first began bothering her during a long car ride x 2 days. Pt also notes pain in her groin. Pt takes baby ASA.Pt denies chest pain and SOB.   Past Medical History:  Diagnosis Date  . DVT (deep venous thrombosis) ([place])   . Factor V Leiden ([place])   . Left leg DVT ([place]) 08/09/2015    Patient Active Problem List   Diagnosis Date Noted  . Heterozygous factor V Leiden mutation ([place]) 08/09/2015  . Left leg DVT ([place]) 08/09/2015  . METATARSALGIA 01/20/2010  . FOOT PAIN, LEFT 01/20/2010    Past Surgical History:  Procedure Laterality Date  . CESAREAN SECTION      OB History    No data available       Home Medications    Prior to Admission medications   Medication Sig Start Date End Date Taking? Authorizing Provider  aspirin 81 MG chewable tablet Chew 81 mg by mouth daily.    Historical Provider, MD  Cetirizine HCl 10 MG CAPS Take 10 mg by mouth.    Historical Provider, MD    Family History No family history on file.  Social History Social History  Substance Use Topics  . Smoking status: Never Smoker  . Smokeless tobacco: Never Used  . Alcohol use No     Allergies   Penicillins   Review of Systems Review of Systems  Respiratory: Negative for shortness of breath.     Cardiovascular: Negative for chest pain.  Musculoskeletal: Positive for joint swelling.     Physical Exam Updated Vital Signs BP 151/64 (BP Location: Right Wrist)   Pulse 73   Temp 98.2 F (36.8 C) (Oral)   Resp 16   Wt 112.3 kg   LMP 05/14/2016   SpO2 100%   BMI 38.77 kg/m   Physical Exam  Constitutional: She appears well-developed and well-nourished. No distress.  HENT:  Head: Normocephalic and atraumatic.  Eyes: Conjunctivae are normal.  Cardiovascular: Normal rate.   Pulmonary/Chest: Effort normal.  Abdominal: She exhibits no distension.  Musculoskeletal: She exhibits edema (L-leg up to groin).  Neurological: She is alert.  Skin: Skin is warm and dry.  Psychiatric: She has a normal mood and affect.  Nursing note and vitals reviewed.    ED Treatments / Results  DIAGNOSTIC STUDIES:  Oxygen Saturation is 98% on RA, normal by my interpretation.    COORDINATION OF CARE:  9:12 PM Discussed treatment plan with pt at bedside and pt agreed to plan.  Labs (all labs ordered are listed, but only abnormal results are displayed) Labs Reviewed - No data to display  EKG  EKG Interpretation None       Radiology No results found.  Procedures Procedures (including critical care time)  Medications Ordered in ED Medications  enoxaparin (  LOVENOX) injection 115 mg (115 mg Subcutaneous Given 05/21/16 2135)     Initial Impression / Assessment and Plan / ED Course  I have reviewed the triage vital signs and the nursing notes.  Pertinent labs & imaging results that were available during my care of the patient were reviewed by me and considered in my medical decision making (see chart for details).  Clinical Course    Patient with leg swelling, this concerning for possible DVT. Past the time of evaluation at the hospital. Patient given Lovenox 1 mg/kg subcutaneous. She is to return in the morning for DVT study. Final Clinical Impressions(s) / ED Diagnoses   Final  diagnoses:  Leg swelling    New Prescriptions Discharge Medication List as of 05/21/2016 10:30 PM       Margarita Mail, PA-C 05/25/16 0005    Charlesetta Shanks, MD 06/08/16 1609

## 2016-05-21 NOTE — ED Triage Notes (Signed)
Patient presents with c/o left leg swelling  Has been on a trip to [place] but did stop and walk drank water  History of DVT

## 2016-05-21 NOTE — ED Notes (Signed)
Pt verbalized understanding of discharge instructions and follow-up care. Denies further questions at this time. Ambulatory to the lobby at time of discharge.

## 2016-05-22 ENCOUNTER — Ambulatory Visit (HOSPITAL_COMMUNITY)
Admission: RE | Admit: 2016-05-22 | Discharge: 2016-05-22 | Disposition: A | Discharge status: Home / Self Care | Payer: 59 | Source: Ambulatory Visit | Attending: Emergency Medicine | Admitting: Emergency Medicine

## 2016-05-22 DIAGNOSIS — M7989 Other specified soft tissue disorders: Secondary | ICD-10-CM | POA: Insufficient documentation

## 2016-05-22 NOTE — Progress Notes (Signed)
**  Preliminary report by tech**  Left lower extremity venous duplex completed. There is no evidence of deep or superficial vein thrombosis involving the left lower extremity. All visualized vessels appear patent and compressible. There is no evidence of a Baker's cyst on the left.   05/22/16 10:29 AM Maria Glenn RVT

## 2016-11-07 ENCOUNTER — Ambulatory Visit (INDEPENDENT_AMBULATORY_CARE_PROVIDER_SITE_OTHER): Payer: 59 | Admitting: Physician Assistant

## 2016-11-07 ENCOUNTER — Encounter: Payer: Self-pay | Admitting: Physician Assistant

## 2016-11-07 VITALS — BP 110/80 | HR 78 | Temp 98.4°F | Resp 14 | Ht 67.0 in | Wt 249.0 lb

## 2016-11-07 DIAGNOSIS — H6983 Other specified disorders of Eustachian tube, bilateral: Secondary | ICD-10-CM | POA: Diagnosis not present

## 2016-11-07 MED ORDER — LEVOCETIRIZINE DIHYDROCHLORIDE 5 MG PO TABS: 5.0000 mg | ORAL_TABLET | Freq: Every evening | ORAL | 1 refills | Status: DC

## 2016-11-07 MED ORDER — MECLIZINE HCL 12.5 MG PO TABS: 12.5000 mg | ORAL_TABLET | Freq: Three times a day (TID) | ORAL | 0 refills | Status: DC | PRN

## 2016-11-07 NOTE — Patient Instructions (Addendum)
Please get an over-the-counter Claritin-D 24 hour -- Take daily over the next few days, before transitioning back to your Xyzal. Hydrate well and rest. Place a humidifier in the bedroom. This should help remove fluid from the ears. I have sent in a prescription for Meclizine to use if needed for dizziness. This should resolve as we get the fluid off of your ears.  Schedule an appointment at earliest convenience for a complete physical.  Please call or return if symptoms are not resolving or if new symptoms present.

## 2016-11-07 NOTE — Progress Notes (Signed)
Pre visit review using our clinic review tool, if applicable. No additional management support is needed unless otherwise documented below in the visit note. 

## 2016-11-07 NOTE — Progress Notes (Signed)
Patient presents to clinic today to establish care.  Acute Concerns: Patient notes this past Thursday she woke up with ear pressure and ear fullness. States she rolled over in her bed and felt the room spinning, lasting < 10 seconds. Took an OTC allergy medication with improvement in symptoms. Felt fine the rest of the day other than nasal congestion and ear pressure. Noted another similar episode this morning. Does note recent cold that has mostly resolved. Still very rare dry cough. Denies falls, trauma or injury. Denies racing heart, lightheadedness or shortness of breath. Patient endorses significant history of seasonal allergies.   Chronic Issues: Factor V -- Diagnosed several years prior. Is on 81 mg ASA daily.  Has one DVT 9 years prior. No recurrence.  Past Medical History:  Diagnosis Date  . Allergy   . DVT (deep venous thrombosis) ([place])   . Factor V Leiden ([place])   . Left leg DVT ([place]) 08/09/2015    Past Surgical History:  Procedure Laterality Date  . CESAREAN SECTION      Current Outpatient Prescriptions on File Prior to Visit  Medication Sig Dispense Refill  . aspirin 81 MG chewable tablet Chew 81 mg by mouth daily.     No current facility-administered medications on file prior to visit.     Allergies  Allergen Reactions  . Penicillins Shortness Of Breath    Family History  Problem Relation Age of Onset  . Cancer Father     Lung    Social History   Social History  . Marital status: Married    Spouse name: N/A  . Number of children: N/A  . Years of education: N/A   Occupational History  . Not on file.   Social History Main Topics  . Smoking status: Never Smoker  . Smokeless tobacco: Never Used  . Alcohol use No  . Drug use: No  . Sexual activity: Yes    Birth control/ protection: Other-see comments     Comment: husband had vasectomy   Other Topics Concern  . Not on file   Social History Narrative  . No narrative on file   Review of  Systems  Constitutional: Negative for fever and weight loss.  HENT: Negative for ear discharge, ear pain, hearing loss and tinnitus.   Eyes: Negative for blurred vision, double vision, photophobia and pain.  Respiratory: Negative for cough and shortness of breath.   Cardiovascular: Negative for chest pain and palpitations.  Gastrointestinal: Negative for abdominal pain, blood in stool, constipation, diarrhea, heartburn, melena, nausea and vomiting.  Genitourinary: Negative for dysuria, flank pain, frequency, hematuria and urgency.  Musculoskeletal: Negative for falls.  Neurological: Positive for dizziness. Negative for tingling, tremors, sensory change, loss of consciousness and headaches.  Endo/Heme/Allergies: Negative for environmental allergies.  Psychiatric/Behavioral: Negative for depression, hallucinations, substance abuse and suicidal ideas. The patient is not nervous/anxious and does not have insomnia.     BP 110/80   Pulse 78   Temp 98.4 F (36.9 C) (Oral)   Resp 14   Ht 5\' 7"  (1.702 m)   Wt 249 lb (112.9 kg)   SpO2 98%   BMI 39.00 kg/m   Physical Exam  Constitutional: She is oriented to person, place, and time and well-developed, well-nourished, and in no distress.  HENT:  Head: Normocephalic and atraumatic.  Right Ear: Tympanic membrane is retracted. Tympanic membrane is not erythematous and not bulging. A middle ear effusion is present.  Left Ear: Tympanic membrane is not erythematous and  not retracted. A middle ear effusion is present.  Nose: Rhinorrhea present. No mucosal edema. Right sinus exhibits no maxillary sinus tenderness and no frontal sinus tenderness. Left sinus exhibits no maxillary sinus tenderness and no frontal sinus tenderness.  Mouth/Throat: Uvula is midline, oropharynx is clear and moist and mucous membranes are normal.  Eyes: Conjunctivae are normal.  Neck: Neck supple.  Cardiovascular: Normal rate, regular rhythm, normal heart sounds and intact  distal pulses.   Pulmonary/Chest: Effort normal and breath sounds normal. No respiratory distress. She has no wheezes. She has no rales. She exhibits no tenderness.  Neurological: She is alert and oriented to person, place, and time.  Skin: Skin is warm and dry. No rash noted.  Psychiatric: Affect normal.  Vitals reviewed.  Assessment/Plan: 1. Dysfunction of both eustachian tubes Serous fluid noted without infective otitis. Claritin D daily for few days to help dry up fluid. Discussed Flonase but she refuses nasal sprays. Supportive measures reviewed. Rx Meclizine in case of repeat dizzy episode. FU if not resolving. Patient to schedule CPE.    Leeanne Rio, PA-C

## 2016-11-28 ENCOUNTER — Encounter: Payer: Self-pay | Admitting: Emergency Medicine

## 2016-11-30 ENCOUNTER — Ambulatory Visit (INDEPENDENT_AMBULATORY_CARE_PROVIDER_SITE_OTHER): Payer: 59 | Admitting: Physician Assistant

## 2016-11-30 ENCOUNTER — Encounter: Payer: Self-pay | Admitting: Physician Assistant

## 2016-11-30 VITALS — BP 120/78 | HR 71 | Temp 98.5°F | Resp 14 | Ht 67.0 in | Wt 249.0 lb

## 2016-11-30 DIAGNOSIS — Z0000 Encounter for general adult medical examination without abnormal findings: Secondary | ICD-10-CM | POA: Insufficient documentation

## 2016-11-30 HISTORY — DX: Encounter for general adult medical examination without abnormal findings: Z00.00

## 2016-11-30 LAB — URINALYSIS, ROUTINE W REFLEX MICROSCOPIC
Bilirubin Urine: NEGATIVE
Hgb urine dipstick: NEGATIVE
KETONES UR: NEGATIVE
Leukocytes, UA: NEGATIVE
NITRITE: NEGATIVE
RBC / HPF: NONE SEEN (ref 0–?)
SPECIFIC GRAVITY, URINE: 1.02 (ref 1.000–1.030)
TOTAL PROTEIN, URINE-UPE24: NEGATIVE
URINE GLUCOSE: NEGATIVE
UROBILINOGEN UA: 0.2 (ref 0.0–1.0)
pH: 5.5 (ref 5.0–8.0)

## 2016-11-30 LAB — LIPID PANEL
CHOL/HDL RATIO: 4
CHOLESTEROL: 233 mg/dL — AB (ref 0–200)
HDL: 54 mg/dL (ref >39.00)
LDL CALC: 156 mg/dL — AB (ref 0–99)
NonHDL: 178.71
Triglycerides: 113 mg/dL (ref 0.0–149.0)
VLDL: 22.6 mg/dL (ref 0.0–40.0)

## 2016-11-30 LAB — COMPREHENSIVE METABOLIC PANEL
ALT: 17 U/L (ref 0–35)
AST: 17 U/L (ref 0–37)
Albumin: 4 g/dL (ref 3.5–5.2)
Alkaline Phosphatase: 57 U/L (ref 39–117)
BUN: 9 mg/dL (ref 6–23)
CALCIUM: 9.3 mg/dL (ref 8.4–10.5)
CHLORIDE: 106 meq/L (ref 96–112)
CO2: 29 meq/L (ref 19–32)
CREATININE: 0.72 mg/dL (ref 0.40–1.20)
GFR: 91.54 mL/min (ref >60.00)
GLUCOSE: 105 mg/dL — AB (ref 70–99)
Potassium: 4.5 mEq/L (ref 3.5–5.1)
Sodium: 138 mEq/L (ref 135–145)
Total Bilirubin: 0.4 mg/dL (ref 0.2–1.2)
Total Protein: 6.7 g/dL (ref 6.0–8.3)

## 2016-11-30 LAB — CBC
HCT: 37.8 % (ref 36.0–46.0)
Hemoglobin: 12.5 g/dL (ref 12.0–15.0)
MCHC: 32.9 g/dL (ref 30.0–36.0)
MCV: 85.9 fl (ref 78.0–100.0)
PLATELETS: 227 10*3/uL (ref 150.0–400.0)
RBC: 4.4 Mil/uL (ref 3.87–5.11)
RDW: 15.2 % (ref 11.5–15.5)
WBC: 5.8 10*3/uL (ref 4.0–10.5)

## 2016-11-30 LAB — HEMOGLOBIN A1C: Hgb A1c MFr Bld: 6.2 % (ref 4.6–6.5)

## 2016-11-30 LAB — TSH: TSH: 1.65 u[IU]/mL (ref 0.35–4.50)

## 2016-11-30 LAB — VITAMIN B12: Vitamin B-12: 207 pg/mL — ABNORMAL LOW (ref 211–911)

## 2016-11-30 NOTE — Progress Notes (Signed)
Pre visit review using our clinic review tool, if applicable. No additional management support is needed unless otherwise documented below in the visit note. 

## 2016-11-30 NOTE — Assessment & Plan Note (Signed)
Depression screen negative. Health Maintenance reviewed -- Declines flu shot. Tetanus up-to-date. Preventive schedule discussed and handout given in AVS. Will obtain fasting labs today.  

## 2016-11-30 NOTE — Progress Notes (Signed)
Patient presents to clinic today for annual exam.  Patient is fasting for labs. Is working out 4 days per week -- [employer]. Diet could be better per patient.   Acute Concerns: Denies acute concerns at today's visit.  Health Maintenance: Immunizations -- Declines flu shot. Tetanus up-to-date.  Mammogram -- Was due in November. Denies history of abnormal mammogram.  PAP -- Followed by Dr. Philis Pique. Overdue for repeat PAP. Last 07/2014. Normal.   Past Medical History:  Diagnosis Date  . Allergy   . DVT (deep venous thrombosis) ([place])   . Factor V Leiden ([place])   . Left leg DVT ([place]) 08/09/2015    Past Surgical History:  Procedure Laterality Date  . CESAREAN SECTION      Current Outpatient Prescriptions on File Prior to Visit  Medication Sig Dispense Refill  . aspirin 81 MG chewable tablet Chew 81 mg by mouth daily.    Marland Kitchen levocetirizine (XYZAL) 5 MG tablet Take 1 tablet (5 mg total) by mouth every evening. 30 tablet 1   No current facility-administered medications on file prior to visit.     Allergies  Allergen Reactions  . Penicillins Shortness Of Breath    Family History  Problem Relation Age of Onset  . Cancer Father     Lung    Social History   Social History  . Marital status: Married    Spouse name: N/A  . Number of children: N/A  . Years of education: N/A   Occupational History  . Not on file.   Social History Main Topics  . Smoking status: Never Smoker  . Smokeless tobacco: Never Used  . Alcohol use No  . Drug use: No  . Sexual activity: Yes    Birth control/ protection: Other-see comments     Comment: husband had vasectomy   Other Topics Concern  . Not on file   Social History Narrative  . No narrative on file   Review of Systems  Constitutional: Negative for fever and weight loss.  HENT: Negative for ear discharge, ear pain, hearing loss and tinnitus.   Eyes: Negative for blurred vision, double vision, photophobia and pain.    Respiratory: Negative for cough and shortness of breath.   Cardiovascular: Negative for chest pain and palpitations.  Gastrointestinal: Negative for abdominal pain, blood in stool, constipation, diarrhea, heartburn, melena, nausea and vomiting.  Genitourinary: Negative for dysuria, flank pain, frequency, hematuria and urgency.  Musculoskeletal: Negative for falls.  Neurological: Negative for dizziness, loss of consciousness and headaches.  Endo/Heme/Allergies: Negative for environmental allergies.  Psychiatric/Behavioral: Negative for depression, hallucinations, substance abuse and suicidal ideas. The patient is not nervous/anxious and does not have insomnia.     BP 120/78   Pulse 71   Temp 98.5 F (36.9 C) (Oral)   Resp 14   Ht 5\' 7"  (1.702 m)   Wt 249 lb (112.9 kg)   SpO2 98%   BMI 39.00 kg/m   Physical Exam  Constitutional: She is oriented to person, place, and time and well-developed, well-nourished, and in no distress.  HENT:  Head: Normocephalic and atraumatic.  Right Ear: Tympanic membrane, external ear and ear canal normal.  Left Ear: Tympanic membrane, external ear and ear canal normal.  Nose: Nose normal. No mucosal edema.  Mouth/Throat: Uvula is midline, oropharynx is clear and moist and mucous membranes are normal. No oropharyngeal exudate or posterior oropharyngeal erythema.  Eyes: Conjunctivae are normal. Pupils are equal, round, and reactive to light.  Neck: Neck supple. No thyromegaly present.  Cardiovascular: Normal rate, regular rhythm, normal heart sounds and intact distal pulses.   Pulmonary/Chest: Effort normal and breath sounds normal. No respiratory distress. She has no wheezes. She has no rales.  Abdominal: Soft. Bowel sounds are normal. She exhibits no distension and no mass. There is no tenderness. There is no rebound and no guarding.  Lymphadenopathy:    She has no cervical adenopathy.  Neurological: She is alert and oriented to person, place, and  time. No cranial nerve deficit.  Skin: Skin is warm and dry. No rash noted.  Psychiatric: Affect normal.  Vitals reviewed.  Assessment/Plan: Visit for preventive health examination Depression screen negative. Health Maintenance reviewed -- Declines flu shot. Tetanus up-to-date. Preventive schedule discussed and handout given in AVS. Will obtain fasting labs today.     Leeanne Rio, PA-C

## 2016-11-30 NOTE — Patient Instructions (Signed)
Please go to the lab for blood work.   Our office will call you with your results unless you have chosen to receive results via MyChart.  If your blood work is normal we will follow-up each year for physicals and as scheduled for chronic medical problems.  If anything is abnormal we will treat accordingly and get you in for a follow-up.  Make sure to schedule your yearly exam with Dr. Philis Pique  Preventive Care 40-64 Years, Female Preventive care refers to lifestyle choices and visits with your health care provider that can promote health and wellness. What does preventive care include?  A yearly physical exam. This is also called an annual well check.  Dental exams once or twice a year.  Routine eye exams. Ask your health care provider how often you should have your eyes checked.  Personal lifestyle choices, including:  Daily care of your teeth and gums.  Regular physical activity.  Eating a healthy diet.  Avoiding tobacco and drug use.  Limiting alcohol use.  Practicing safe sex.  Taking low-dose aspirin daily starting at age 99.  Taking vitamin and mineral supplements as recommended by your health care provider. What happens during an annual well check? The services and screenings done by your health care provider during your annual well check will depend on your age, overall health, lifestyle risk factors, and family history of disease. Counseling  Your health care provider may ask you questions about your:  Alcohol use.  Tobacco use.  Drug use.  Emotional well-being.  Home and relationship well-being.  Sexual activity.  Eating habits.  Work and work Statistician.  Method of birth control.  Menstrual cycle.  Pregnancy history. Screening  You may have the following tests or measurements:  Height, weight, and BMI.  Blood pressure.  Lipid and cholesterol levels. These may be checked every 5 years, or more frequently if you are over 39 years  old.  Skin check.  Lung cancer screening. You may have this screening every year starting at age 23 if you have a 30-pack-year history of smoking and currently smoke or have quit within the past 15 years.  Fecal occult blood test (FOBT) of the stool. You may have this test every year starting at age 24.  Flexible sigmoidoscopy or colonoscopy. You may have a sigmoidoscopy every 5 years or a colonoscopy every 10 years starting at age 34.  Hepatitis C blood test.  Hepatitis B blood test.  Sexually transmitted disease (STD) testing.  Diabetes screening. This is done by checking your blood sugar (glucose) after you have not eaten for a while (fasting). You may have this done every 1-3 years.  Mammogram. This may be done every 1-2 years. Talk to your health care provider about when you should start having regular mammograms. This may depend on whether you have a family history of breast cancer.  BRCA-related cancer screening. This may be done if you have a family history of breast, ovarian, tubal, or peritoneal cancers.  Pelvic exam and Pap test. This may be done every 3 years starting at age 76. Starting at age 54, this may be done every 5 years if you have a Pap test in combination with an HPV test.  Bone density scan. This is done to screen for osteoporosis. You may have this scan if you are at high risk for osteoporosis. Discuss your test results, treatment options, and if necessary, the need for more tests with your health care provider. Vaccines  Your health care  provider may recommend certain vaccines, such as:  Influenza vaccine. This is recommended every year.  Tetanus, diphtheria, and acellular pertussis (Tdap, Td) vaccine. You may need a Td booster every 10 years.  Varicella vaccine. You may need this if you have not been vaccinated.  Zoster vaccine. You may need this after age 30.  Measles, mumps, and rubella (MMR) vaccine. You may need at least one dose of MMR if you were  born in 1957 or later. You may also need a second dose.  Pneumococcal 13-valent conjugate (PCV13) vaccine. You may need this if you have certain conditions and were not previously vaccinated.  Pneumococcal polysaccharide (PPSV23) vaccine. You may need one or two doses if you smoke cigarettes or if you have certain conditions.  Meningococcal vaccine. You may need this if you have certain conditions.  Hepatitis A vaccine. You may need this if you have certain conditions or if you travel or work in places where you may be exposed to hepatitis A.  Hepatitis B vaccine. You may need this if you have certain conditions or if you travel or work in places where you may be exposed to hepatitis B.  Haemophilus influenzae type b (Hib) vaccine. You may need this if you have certain conditions. Talk to your health care provider about which screenings and vaccines you need and how often you need them. This information is not intended to replace advice given to you by your health care provider. Make sure you discuss any questions you have with your health care provider. Document Released: 10/08/2015 Document Revised: 05/31/2016 Document Reviewed: 07/13/2015 Elsevier Interactive Patient Education  2017 [employer].

## 2016-12-03 LAB — VITAMIN D 1,25 DIHYDROXY
VITAMIN D 1, 25 (OH) TOTAL: 52 pg/mL (ref 18–72)
Vitamin D2 1, 25 (OH)2: <8 pg/mL
Vitamin D3 1, 25 (OH)2: 52 pg/mL

## 2016-12-06 ENCOUNTER — Other Ambulatory Visit: Payer: Self-pay | Admitting: *Deleted

## 2016-12-06 DIAGNOSIS — E538 Deficiency of other specified B group vitamins: Secondary | ICD-10-CM

## 2016-12-06 DIAGNOSIS — E785 Hyperlipidemia, unspecified: Secondary | ICD-10-CM

## 2016-12-06 DIAGNOSIS — R7309 Other abnormal glucose: Secondary | ICD-10-CM

## 2017-01-09 ENCOUNTER — Other Ambulatory Visit (INDEPENDENT_AMBULATORY_CARE_PROVIDER_SITE_OTHER): Payer: 59

## 2017-01-09 DIAGNOSIS — E538 Deficiency of other specified B group vitamins: Secondary | ICD-10-CM | POA: Diagnosis not present

## 2017-01-09 LAB — VITAMIN B12: Vitamin B-12: 479 pg/mL (ref 211–911)

## 2017-06-21 ENCOUNTER — Other Ambulatory Visit: Payer: 59

## 2017-11-02 ENCOUNTER — Encounter: Payer: Self-pay | Admitting: Physician Assistant

## 2017-11-02 ENCOUNTER — Other Ambulatory Visit: Payer: Self-pay

## 2017-11-02 ENCOUNTER — Ambulatory Visit: Payer: 59 | Admitting: Physician Assistant

## 2017-11-02 VITALS — BP 122/72 | HR 90 | Temp 98.1°F | Resp 16 | Ht 67.0 in | Wt 240.0 lb

## 2017-11-02 DIAGNOSIS — L42 Pityriasis rosea: Secondary | ICD-10-CM | POA: Diagnosis not present

## 2017-11-02 MED ORDER — HYDROXYZINE HCL 10 MG PO TABS: 10.0000 mg | ORAL_TABLET | Freq: Three times a day (TID) | ORAL | 0 refills | Status: DC | PRN

## 2017-11-02 MED ORDER — TRIAMCINOLONE ACETONIDE 0.1 % EX CREA: 1.0000 "application " | TOPICAL_CREAM | Freq: Two times a day (BID) | CUTANEOUS | 0 refills | Status: DC

## 2017-11-02 NOTE — Progress Notes (Signed)
Patient presents to clinic today c/o 1.5 weeks of a rash that started as a single round spot on her R mid torso that subsequently spread to multiple smaller lesions of her trunk. Denies rash of extremities or face. Denies fever, chills, malaise or fatigue. Rash is not painful. Is sometimes pruritic for which she has been applying Desitin. Denies change in soaps, lotions, detergents and other hygiene products. Denies recent travel or contact with similar symptoms.   Past Medical History:  Diagnosis Date  . Allergy   . DVT (deep venous thrombosis) ([place])   . Factor V Leiden ([place])   . Left leg DVT ([place]) 08/09/2015    Current Outpatient Medications on File Prior to Visit  Medication Sig Dispense Refill  . aspirin 81 MG chewable tablet Chew 81 mg by mouth daily.    Marland Kitchen levocetirizine (XYZAL) 5 MG tablet Take 1 tablet (5 mg total) by mouth every evening. 30 tablet 1   No current facility-administered medications on file prior to visit.     Allergies  Allergen Reactions  . Penicillins Shortness Of Breath    Family History  Problem Relation Age of Onset  . Cancer Father        Lung    Social History   Socioeconomic History  . Marital status: Married    Spouse name: None  . Number of children: None  . Years of education: None  . Highest education level: None  Social Needs  . Financial resource strain: None  . Food insecurity - worry: None  . Food insecurity - inability: None  . Transportation needs - medical: None  . Transportation needs - non-medical: None  Occupational History  . None  Tobacco Use  . Smoking status: Never Smoker  . Smokeless tobacco: Never Used  Substance and Sexual Activity  . Alcohol use: No    Alcohol/week: 0.0 oz  . Drug use: No  . Sexual activity: Yes    Birth control/protection: Other-see comments    Comment: husband had vasectomy  Other Topics Concern  . None  Social History Narrative  . None    Review of Systems - See HPI.  All other ROS  are negative.  BP 122/72   Pulse 90   Temp 98.1 F (36.7 C) (Oral)   Resp 16   Ht 5\' 7"  (8.891 m)   Wt 240 lb (108.9 kg)   SpO2 98%   BMI 37.59 kg/m   Physical Exam  Constitutional: She is oriented to person, place, and time and well-developed, well-nourished, and in no distress.  HENT:  Head: Normocephalic and atraumatic.  Eyes: Conjunctivae are normal.  Neck: Neck supple.  Cardiovascular: Normal rate, regular rhythm, normal heart sounds and intact distal pulses.  Pulmonary/Chest: Effort normal and breath sounds normal. No respiratory distress. She has no wheezes. She has no rales. She exhibits no tenderness.  Neurological: She is alert and oriented to person, place, and time.  Skin: Skin is warm and dry.     Psychiatric: Affect normal.  Vitals reviewed.  Assessment/Plan: 1. Pityriasis rosea Classic herald patch with christmas tree like pattern over back and torso. Discussed viral etiology and self-limiting nature of this rash. Discussed can last several weeks and in rare cases, up to three months. Treatment is symptomatic. Will start Triamcinolone to use on the most frustratingly itchy areas. Rx hydroxyzine. Supportive measures and OTC medications reviewed. Follow-up discussed.  - triamcinolone cream (KENALOG) 0.1 %; Apply 1 application topically 2 (two) times daily.  Dispense: 30 g; Refill: 0 - hydrOXYzine (ATARAX/VISTARIL) 10 MG tablet; Take 1 tablet (10 mg total) by mouth 3 (three) times daily as needed.  Dispense: 30 tablet; Refill: 0    Leeanne Rio, PA-C

## 2017-11-02 NOTE — Patient Instructions (Signed)
Please keep well-hydrated and get plenty of rest.  This is a viral rash and will take a few weeks to go away. This can sometimes last up to 8-10 weeks but typically does not last quite that long. Cold compresses and Sarna Lotion can help with itch.  I am giving you a steroid cream to apply to areas as directed that are severely itchy -- no putting on face, groin or in the arm pit region. Take the Hydroxyzine as directed if needed for significant itch.   Pityriasis Rosea Pityriasis rosea is a rash that usually appears on the trunk of the body. It may also appear on the upper arms and upper legs. It usually begins as a single patch, and then more patches begin to develop. The rash may cause mild itching, but it normally does not cause other problems. It usually goes away without treatment. However, it may take weeks or months for the rash to go away completely. What are the causes? The cause of this condition is not known. The condition does not spread from person to person (is noncontagious). What increases the risk? This condition is more likely to develop in young adults and children. It is most common in the spring and fall. What are the signs or symptoms? The main symptom of this condition is a rash.  The rash usually begins with a single oval patch that is larger than the ones that follow. This is called a herald patch. It generally appears a week or more before the rest of the rash appears.  When more patches start to develop, they spread quickly on the trunk, back, and arms. These patches are smaller than the first one.  The patches that make up the rash are usually oval-shaped and pink or red in color. They are usually flat, but they may sometimes be raised so that they can be felt with a finger. They may also be finely crinkled and have a scaly ring around the edge.  The rash does not typically appear on areas of the skin that are exposed to the sun.  Most people who have this  condition do not have other symptoms, but some have mild itching. In a few cases, a mild headache or body aches may occur before the rash appears and then go away. How is this diagnosed? Your health care provider may diagnose this condition by doing a physical exam and taking your medical history. To rule out other possible causes for the rash, the health care provider may order blood tests or take a skin sample from the rash to be looked at under a microscope. How is this treated? Usually, treatment is not needed for this condition. The rash will probably go away on its own in 4-8 weeks. In some cases, a health care provider may recommend or prescribe medicine to reduce itching. Follow these instructions at home:  Take medicines only as directed by your health care provider.  Avoid scratching the affected areas of skin.  Do not take hot baths or use a sauna. Use only warm water when bathing or showering. Heat can increase itching. Contact a health care provider if:  Your rash does not go away in 8 weeks.  Your rash gets much worse.  You have a fever.  You have swelling or pain in the rash area.  You have fluid, blood, or pus coming from the rash area. This information is not intended to replace advice given to you by your health care provider.  Make sure you discuss any questions you have with your health care provider. Document Released: 10/18/2001 Document Revised: 02/17/2016 Document Reviewed: 08/19/2014 Elsevier Interactive Patient Education  [employer].

## 2017-12-19 ENCOUNTER — Other Ambulatory Visit (INDEPENDENT_AMBULATORY_CARE_PROVIDER_SITE_OTHER): Payer: 59

## 2017-12-19 ENCOUNTER — Encounter: Payer: Self-pay | Admitting: Physician Assistant

## 2017-12-19 ENCOUNTER — Ambulatory Visit (INDEPENDENT_AMBULATORY_CARE_PROVIDER_SITE_OTHER): Payer: 59 | Admitting: Physician Assistant

## 2017-12-19 ENCOUNTER — Other Ambulatory Visit: Payer: Self-pay

## 2017-12-19 VITALS — BP 110/72 | HR 71 | Temp 98.7°F | Resp 16 | Ht 67.0 in | Wt 219.0 lb

## 2017-12-19 DIAGNOSIS — R7989 Other specified abnormal findings of blood chemistry: Secondary | ICD-10-CM | POA: Diagnosis not present

## 2017-12-19 DIAGNOSIS — Z0000 Encounter for general adult medical examination without abnormal findings: Secondary | ICD-10-CM

## 2017-12-19 DIAGNOSIS — D6851 Activated protein C resistance: Secondary | ICD-10-CM

## 2017-12-19 LAB — CBC WITH DIFFERENTIAL/PLATELET
BASOS ABS: 0 10*3/uL (ref 0.0–0.1)
Basophils Relative: 0.6 % (ref 0.0–3.0)
EOS ABS: 0.3 10*3/uL (ref 0.0–0.7)
Eosinophils Relative: 6.1 % — ABNORMAL HIGH (ref 0.0–5.0)
HCT: 40.2 % (ref 36.0–46.0)
Hemoglobin: 13.4 g/dL (ref 12.0–15.0)
LYMPHS ABS: 2.1 10*3/uL (ref 0.7–4.0)
LYMPHS PCT: 37.3 % (ref 12.0–46.0)
MCHC: 33.4 g/dL (ref 30.0–36.0)
MCV: 85.6 fl (ref 78.0–100.0)
MONO ABS: 0.5 10*3/uL (ref 0.1–1.0)
Monocytes Relative: 8.4 % (ref 3.0–12.0)
NEUTROS ABS: 2.7 10*3/uL (ref 1.4–7.7)
NEUTROS PCT: 47.6 % (ref 43.0–77.0)
PLATELETS: 186 10*3/uL (ref 150.0–400.0)
RBC: 4.7 Mil/uL (ref 3.87–5.11)
RDW: 15.6 % — AB (ref 11.5–15.5)
WBC: 5.6 10*3/uL (ref 4.0–10.5)

## 2017-12-19 LAB — COMPREHENSIVE METABOLIC PANEL
ALBUMIN: 4.1 g/dL (ref 3.5–5.2)
ALT: 20 U/L (ref 0–35)
AST: 18 U/L (ref 0–37)
Alkaline Phosphatase: 46 U/L (ref 39–117)
BUN: 15 mg/dL (ref 6–23)
CO2: 26 meq/L (ref 19–32)
CREATININE: 0.69 mg/dL (ref 0.40–1.20)
Calcium: 9.5 mg/dL (ref 8.4–10.5)
Chloride: 104 mEq/L (ref 96–112)
GFR: 95.73 mL/min (ref >60.00)
Glucose, Bld: 111 mg/dL — ABNORMAL HIGH (ref 70–99)
POTASSIUM: 4.2 meq/L (ref 3.5–5.1)
Sodium: 138 mEq/L (ref 135–145)
Total Bilirubin: 0.6 mg/dL (ref 0.2–1.2)
Total Protein: 6.9 g/dL (ref 6.0–8.3)

## 2017-12-19 LAB — LIPID PANEL
CHOL/HDL RATIO: 4
CHOLESTEROL: 216 mg/dL — AB (ref 0–200)
HDL: 53.2 mg/dL (ref >39.00)
LDL CALC: 148 mg/dL — AB (ref 0–99)
NonHDL: 162.86
TRIGLYCERIDES: 73 mg/dL (ref 0.0–149.0)
VLDL: 14.6 mg/dL (ref 0.0–40.0)

## 2017-12-19 LAB — T4, FREE: Free T4: 0.58 ng/dL — ABNORMAL LOW (ref 0.60–1.60)

## 2017-12-19 LAB — TSH: TSH: 5.21 u[IU]/mL — ABNORMAL HIGH (ref 0.35–4.50)

## 2017-12-19 LAB — VITAMIN D 25 HYDROXY (VIT D DEFICIENCY, FRACTURES): VITD: 41.08 ng/mL (ref 30.00–100.00)

## 2017-12-19 NOTE — Assessment & Plan Note (Signed)
Depression screen negative. Health Maintenance reviewed -- PAP and Mammo with GYN. WIll be due for colonoscopy in April at [place] [facility]. Will place referral to GI at that time. Reminder put in EMR. . Preventive schedule discussed and handout given in AVS. Will obtain fasting labs today.

## 2017-12-19 NOTE — Patient Instructions (Signed)
Please go to the lab for blood work.   Our office will call you with your results unless you have chosen to receive results via MyChart.  If your blood work is normal we will follow-up each year for physicals and as scheduled for chronic medical problems.  If anything is abnormal we will treat accordingly and get you in for a follow-up.   Preventive Care 50-64 Years, Female Preventive care refers to lifestyle choices and visits with your health care provider that can promote health and wellness. What does preventive care include?  A yearly physical exam. This is also called an annual well check.  Dental exams once or twice a year.  Routine eye exams. Ask your health care provider how often you should have your eyes checked.  Personal lifestyle choices, including:  Daily care of your teeth and gums.  Regular physical activity.  Eating a healthy diet.  Avoiding tobacco and drug use.  Limiting alcohol use.  Practicing safe sex.  Taking low-dose aspirin daily starting at age 50.  Taking vitamin and mineral supplements as recommended by your health care provider. What happens during an annual well check? The services and screenings done by your health care provider during your annual well check will depend on your age, overall health, lifestyle risk factors, and family history of disease. Counseling  Your health care provider may ask you questions about your:  Alcohol use.  Tobacco use.  Drug use.  Emotional well-being.  Home and relationship well-being.  Sexual activity.  Eating habits.  Work and work environment.  Method of birth control.  Menstrual cycle.  Pregnancy history. Screening  You may have the following tests or measurements:  Height, weight, and BMI.  Blood pressure.  Lipid and cholesterol levels. These may be checked every 5 years, or more frequently if you are over 50 years old.  Skin check.  Lung cancer screening. You may have this  screening every year starting at age 55 if you have a 30-pack-year history of smoking and currently smoke or have quit within the past 15 years.  Fecal occult blood test (FOBT) of the stool. You may have this test every year starting at age 50.  Flexible sigmoidoscopy or colonoscopy. You may have a sigmoidoscopy every 5 years or a colonoscopy every 10 years starting at age 50.  Hepatitis C blood test.  Hepatitis B blood test.  Sexually transmitted disease (STD) testing.  Diabetes screening. This is done by checking your blood sugar (glucose) after you have not eaten for a while (fasting). You may have this done every 1-3 years.  Mammogram. This may be done every 1-2 years. Talk to your health care provider about when you should start having regular mammograms. This may depend on whether you have a family history of breast cancer.  BRCA-related cancer screening. This may be done if you have a family history of breast, ovarian, tubal, or peritoneal cancers.  Pelvic exam and Pap test. This may be done every 3 years starting at age 21. Starting at age 30, this may be done every 5 years if you have a Pap test in combination with an HPV test.  Bone density scan. This is done to screen for osteoporosis. You may have this scan if you are at high risk for osteoporosis. Discuss your test results, treatment options, and if necessary, the need for more tests with your health care provider. Vaccines  Your health care provider may recommend certain vaccines, such as:  Influenza vaccine.   is recommended every year.  Tetanus, diphtheria, and acellular pertussis (Tdap, Td) vaccine. You may need a Td booster every 10 years.  Varicella vaccine. You may need this if you have not been vaccinated.  Zoster vaccine. You may need this after age 60.  Measles, mumps, and rubella (MMR) vaccine. You may need at least one dose of MMR if you were born in 1957 or later. You may also need a second  dose.  Pneumococcal 13-valent conjugate (PCV13) vaccine. You may need this if you have certain conditions and were not previously vaccinated.  Pneumococcal polysaccharide (PPSV23) vaccine. You may need one or two doses if you smoke cigarettes or if you have certain conditions.  Meningococcal vaccine. You may need this if you have certain conditions.  Hepatitis A vaccine. You may need this if you have certain conditions or if you travel or work in places where you may be exposed to hepatitis A.  Hepatitis B vaccine. You may need this if you have certain conditions or if you travel or work in places where you may be exposed to hepatitis B.  Haemophilus influenzae type b (Hib) vaccine. You may need this if you have certain conditions.  Talk to your health care provider about which screenings and vaccines you need and how often you need them. This information is not intended to replace advice given to you by your health care provider. Make sure you discuss any questions you have with your health care provider. Document Released: 10/08/2015 Document Revised: 05/31/2016 Document Reviewed: 07/13/2015 Elsevier Interactive Patient Education  2018 [employer]   

## 2017-12-19 NOTE — Progress Notes (Signed)
Patient presents to clinic today for annual exam.  Patient is fasting for labs.  Acute Concerns: Denies acute concerns today.  Health Maintenance: Immunizations -- Tetanus up-to-date. Declines flu shot.  Mammogram -- Due. Denies history of abnormal mammogram. Is scheduling with GYN. PAP -- Due. Denies history of abnormal PAP. Is scheduling with GYN.  Past Medical History:  Diagnosis Date  . Allergy   . DVT (deep venous thrombosis) ([place])   . Factor V Leiden ([place])   . Left leg DVT ([place]) 08/09/2015    Past Surgical History:  Procedure Laterality Date  . CESAREAN SECTION      Current Outpatient Medications on File Prior to Visit  Medication Sig Dispense Refill  . aspirin 81 MG chewable tablet Chew 81 mg by mouth daily.    Marland Kitchen levocetirizine (XYZAL) 5 MG tablet Take 1 tablet (5 mg total) by mouth every evening. 30 tablet 1   No current facility-administered medications on file prior to visit.     Allergies  Allergen Reactions  . Penicillins Shortness Of Breath    Family History  Problem Relation Age of Onset  . Cancer Father        Lung    Social History   Socioeconomic History  . Marital status: Married    Spouse name: Not on file  . Number of children: Not on file  . Years of education: Not on file  . Highest education level: Not on file  Occupational History  . Not on file  Social Needs  . Financial resource strain: Not on file  . Food insecurity:    Worry: Not on file    Inability: Not on file  . Transportation needs:    Medical: Not on file    Non-medical: Not on file  Tobacco Use  . Smoking status: Never Smoker  . Smokeless tobacco: Never Used  Substance and Sexual Activity  . Alcohol use: No    Alcohol/week: 0.0 oz  . Drug use: No  . Sexual activity: Yes    Birth control/protection: Other-see comments    Comment: husband had vasectomy  Lifestyle  . Physical activity:    Days per week: Not on file    Minutes per session: Not on file  .  Stress: Not on file  Relationships  . Social connections:    Talks on phone: Not on file    Gets together: Not on file    Attends religious service: Not on file    Active member of club or organization: Not on file    Attends meetings of clubs or organizations: Not on file    Relationship status: Not on file  . Intimate partner violence:    Fear of current or ex partner: Not on file    Emotionally abused: Not on file    Physically abused: Not on file    Forced sexual activity: Not on file  Other Topics Concern  . Not on file  Social History Narrative  . Not on file   Review of Systems  Constitutional: Negative for fever and weight loss.  HENT: Negative for ear discharge, ear pain, hearing loss and tinnitus.   Eyes: Negative for blurred vision, double vision, photophobia and pain.  Respiratory: Negative for cough and shortness of breath.   Cardiovascular: Negative for chest pain and palpitations.  Gastrointestinal: Negative for abdominal pain, blood in stool, constipation, diarrhea, heartburn, melena, nausea and vomiting.  Genitourinary: Negative for dysuria, flank pain, frequency, hematuria and urgency.  Musculoskeletal: Negative for falls.  Neurological: Negative for dizziness, loss of consciousness and headaches.  Endo/Heme/Allergies: Negative for environmental allergies.  Psychiatric/Behavioral: Negative for depression, hallucinations, substance abuse and suicidal ideas. The patient is not nervous/anxious and does not have insomnia.    BP 110/72   Pulse 71   Temp 98.7 F (37.1 C) (Oral)   Resp 16   Ht 5\' 7"  (1.702 m)   Wt 219 lb (99.3 kg)   SpO2 98%   BMI 34.30 kg/m   Physical Exam  Constitutional: She is oriented to person, place, and time and well-developed, well-nourished, and in no distress.  HENT:  Head: Normocephalic and atraumatic.  Right Ear: Tympanic membrane, external ear and ear canal normal.  Left Ear: Tympanic membrane, external ear and ear canal  normal.  Nose: Nose normal. No mucosal edema.  Mouth/Throat: Uvula is midline, oropharynx is clear and moist and mucous membranes are normal. No oropharyngeal exudate or posterior oropharyngeal erythema.  Eyes: Pupils are equal, round, and reactive to light. Conjunctivae are normal.  Neck: Neck supple. No thyromegaly present.  Cardiovascular: Normal rate, regular rhythm, normal heart sounds and intact distal pulses.  Pulmonary/Chest: Effort normal and breath sounds normal. No respiratory distress. She has no wheezes. She has no rales.  Abdominal: Soft. Bowel sounds are normal. She exhibits no distension and no mass. There is no tenderness. There is no rebound and no guarding.  Lymphadenopathy:    She has no cervical adenopathy.  Neurological: She is alert and oriented to person, place, and time. No cranial nerve deficit.  Skin: Skin is warm and dry. No rash noted.  Psychiatric: Affect normal.  Vitals reviewed.  Assessment/Plan: Visit for preventive health examination Depression screen negative. Health Maintenance reviewed -- PAP and Mammo with GYN. WIll be due for colonoscopy in April at [place] [facility]. Will place referral to GI at that time. Reminder put in EMR. . Preventive schedule discussed and handout given in AVS. Will obtain fasting labs today.   Heterozygous factor V Leiden mutation ([place]) Followed by Hematology. Stable. No current issues.     Leeanne Rio, PA-C

## 2017-12-19 NOTE — Assessment & Plan Note (Signed)
Followed by Hematology. Stable. No current issues.

## 2017-12-20 ENCOUNTER — Other Ambulatory Visit: Payer: Self-pay

## 2017-12-20 ENCOUNTER — Other Ambulatory Visit (INDEPENDENT_AMBULATORY_CARE_PROVIDER_SITE_OTHER): Payer: 59

## 2017-12-20 DIAGNOSIS — R739 Hyperglycemia, unspecified: Secondary | ICD-10-CM | POA: Diagnosis not present

## 2017-12-20 DIAGNOSIS — R7989 Other specified abnormal findings of blood chemistry: Secondary | ICD-10-CM

## 2017-12-20 LAB — HEMOGLOBIN A1C: Hgb A1c MFr Bld: 5.9 % (ref 4.6–6.5)

## 2017-12-20 MED ORDER — LEVOTHYROXINE SODIUM 88 MCG PO TABS: 88.0000 ug | ORAL_TABLET | Freq: Every day | ORAL | 0 refills | Status: DC

## 2018-01-30 ENCOUNTER — Ambulatory Visit: Payer: 59 | Admitting: Physician Assistant

## 2018-01-30 ENCOUNTER — Encounter: Payer: Self-pay | Admitting: Physician Assistant

## 2018-01-30 ENCOUNTER — Other Ambulatory Visit: Payer: Self-pay

## 2018-01-30 VITALS — BP 120/70 | HR 67 | Temp 98.4°F | Resp 14 | Ht 67.0 in | Wt 213.0 lb

## 2018-01-30 DIAGNOSIS — M7711 Lateral epicondylitis, right elbow: Secondary | ICD-10-CM | POA: Diagnosis not present

## 2018-01-30 DIAGNOSIS — R7989 Other specified abnormal findings of blood chemistry: Secondary | ICD-10-CM

## 2018-01-30 LAB — B12 AND FOLATE PANEL
FOLATE: 12.4 ng/mL (ref >5.9)
VITAMIN B 12: 286 pg/mL (ref 211–911)

## 2018-01-30 LAB — TSH: TSH: 1.43 u[IU]/mL (ref 0.35–4.50)

## 2018-01-30 NOTE — Progress Notes (Signed)
Patient presents to clinic today for follow-up of thyroid function after initiating 88 mcg daily of levothyroxine. Patient endorses taking medication daily as directed. Notes initial improvement in energy levels but now feels back to her baseline. Denies cold intolerance or constipation currently, changed from last discussion. Denies any new or worsening symptoms. Is sleeping well and working hard on staying active.   Patient endorses recurrent lateral R elbow pain with some mild radiation into the forearm. Denies radiation to hand. Denies any redness, bruising, numbness or tingling. Has prior history of similar symptoms.  Past Medical History:  Diagnosis Date  . Allergy   . DVT (deep venous thrombosis) ([place])   . Factor V Leiden ([place])   . Left leg DVT ([place]) 08/09/2015    Current Outpatient Medications on File Prior to Visit  Medication Sig Dispense Refill  . aspirin 81 MG chewable tablet Chew 81 mg by mouth daily.    Marland Kitchen levocetirizine (XYZAL) 5 MG tablet Take 1 tablet (5 mg total) by mouth every evening. 30 tablet 1  . levothyroxine (SYNTHROID, LEVOTHROID) 88 MCG tablet Take 1 tablet (88 mcg total) by mouth daily. 90 tablet 0   No current facility-administered medications on file prior to visit.     Allergies  Allergen Reactions  . Penicillins Shortness Of Breath    Family History  Problem Relation Age of Onset  . Cancer Father        Lung    Social History   Socioeconomic History  . Marital status: Married    Spouse name: Not on file  . Number of children: Not on file  . Years of education: Not on file  . Highest education level: Not on file  Occupational History  . Not on file  Social Needs  . Financial resource strain: Not on file  . Food insecurity:    Worry: Not on file    Inability: Not on file  . Transportation needs:    Medical: Not on file    Non-medical: Not on file  Tobacco Use  . Smoking status: Never Smoker  . Smokeless tobacco: Never Used    Substance and Sexual Activity  . Alcohol use: No    Alcohol/week: 0.0 oz  . Drug use: No  . Sexual activity: Yes    Birth control/protection: Other-see comments    Comment: husband had vasectomy  Lifestyle  . Physical activity:    Days per week: Not on file    Minutes per session: Not on file  . Stress: Not on file  Relationships  . Social connections:    Talks on phone: Not on file    Gets together: Not on file    Attends religious service: Not on file    Active member of club or organization: Not on file    Attends meetings of clubs or organizations: Not on file    Relationship status: Not on file  Other Topics Concern  . Not on file  Social History Narrative  . Not on file   Review of Systems - See HPI.  All other ROS are negative.  BP 120/70   Pulse 67   Temp 98.4 F (36.9 C) (Oral)   Resp 14   Ht 5\' 7"  (1.702 m)   Wt 213 lb (96.6 kg)   SpO2 98%   BMI 33.36 kg/m   Physical Exam  Constitutional: She is oriented to person, place, and time. She appears well-developed and well-nourished.  HENT:  Head: Normocephalic and atraumatic.  Cardiovascular: Normal rate, regular rhythm and normal heart sounds.  Pulmonary/Chest: Effort normal.  Musculoskeletal:       Right elbow: She exhibits normal range of motion and no swelling. Tenderness found. Lateral epicondyle tenderness noted. No radial head, no medial epicondyle and no olecranon process tenderness noted.  Neurological: She is alert and oriented to person, place, and time.  Vitals reviewed.   Recent Results (from the past 2160 hour(s))  CBC with Differential/Platelet     Status: Abnormal   Collection Time: 12/19/17  8:38 AM  Result Value Ref Range   WBC 5.6 4.0 - 10.5 K/uL   RBC 4.70 3.87 - 5.11 Mil/uL   Hemoglobin 13.4 12.0 - 15.0 g/dL   HCT 40.2 36.0 - 46.0 %   MCV 85.6 78.0 - 100.0 fl   MCHC 33.4 30.0 - 36.0 g/dL   RDW 15.6 (H) 11.5 - 15.5 %   Platelets 186.0 150.0 - 400.0 K/uL   Neutrophils Relative %  47.6 43.0 - 77.0 %   Lymphocytes Relative 37.3 12.0 - 46.0 %   Monocytes Relative 8.4 3.0 - 12.0 %   Eosinophils Relative 6.1 (H) 0.0 - 5.0 %   Basophils Relative 0.6 0.0 - 3.0 %   Neutro Abs 2.7 1.4 - 7.7 K/uL   Lymphs Abs 2.1 0.7 - 4.0 K/uL   Monocytes Absolute 0.5 0.1 - 1.0 K/uL   Eosinophils Absolute 0.3 0.0 - 0.7 K/uL   Basophils Absolute 0.0 0.0 - 0.1 K/uL  Comprehensive metabolic panel     Status: Abnormal   Collection Time: 12/19/17  8:38 AM  Result Value Ref Range   Sodium 138 135 - 145 mEq/L   Potassium 4.2 3.5 - 5.1 mEq/L   Chloride 104 96 - 112 mEq/L   CO2 26 19 - 32 mEq/L   Glucose, Bld 111 (H) 70 - 99 mg/dL   BUN 15 6 - 23 mg/dL   Creatinine, Ser 0.69 0.40 - 1.20 mg/dL   Total Bilirubin 0.6 0.2 - 1.2 mg/dL   Alkaline Phosphatase 46 39 - 117 U/L   AST 18 0 - 37 U/L   ALT 20 0 - 35 U/L   Total Protein 6.9 6.0 - 8.3 g/dL   Albumin 4.1 3.5 - 5.2 g/dL   Calcium 9.5 8.4 - 10.5 mg/dL   GFR 95.73 >60.00 mL/min  Lipid panel     Status: Abnormal   Collection Time: 12/19/17  8:38 AM  Result Value Ref Range   Cholesterol 216 (H) 0 - 200 mg/dL    Comment: ATP III Classification       Desirable:  < 200 mg/dL               Borderline High:  200 - 239 mg/dL          High:  > = 240 mg/dL   Triglycerides 73.0 0.0 - 149.0 mg/dL    Comment: Normal:  <150 mg/dLBorderline High:  150 - 199 mg/dL   HDL 53.20 >39.00 mg/dL   VLDL 14.6 0.0 - 40.0 mg/dL   LDL Cholesterol 148 (H) 0 - 99 mg/dL   Total CHOL/HDL Ratio 4     Comment:                Men          Women1/2 Average Risk     3.4          3.3Average Risk          5.0  4.42X Average Risk          9.6          7.13X Average Risk          15.0          11.0                       NonHDL 162.86     Comment: NOTE:  Non-HDL goal should be 30 mg/dL higher than patient's LDL goal (i.e. LDL goal of < 70 mg/dL, would have non-HDL goal of < 100 mg/dL)  TSH     Status: Abnormal   Collection Time: 12/19/17  8:38 AM  Result Value Ref  Range   TSH 5.21 (H) 0.35 - 4.50 uIU/mL  Vitamin D (25 hydroxy)     Status: None   Collection Time: 12/19/17  8:38 AM  Result Value Ref Range   VITD 41.08 30.00 - 100.00 ng/mL  T4, free     Status: Abnormal   Collection Time: 12/19/17  4:01 PM  Result Value Ref Range   Free T4 0.58 (L) 0.60 - 1.60 ng/dL    Comment: Specimens from patients who are undergoing biotin therapy and /or ingesting biotin supplements may contain high levels of biotin.  The higher biotin concentration in these specimens interferes with this Free T4 assay.  Specimens that contain high levels  of biotin may cause false high results for this Free T4 assay.  Please interpret results in light of the total clinical presentation of the patient.    Hemoglobin A1c     Status: None   Collection Time: 12/20/17  9:14 AM  Result Value Ref Range   Hgb A1c MFr Bld 5.9 4.6 - 6.5 %    Comment: Glycemic Control Guidelines for People with Diabetes:Non Diabetic:  <6%Goal of Therapy: <7%Additional Action Suggested:  >8%     Assessment/Plan: 1. Low T4 Repeat TSH/T4  levels today to make sure within normal limits.  - TSH - T4, Free - B12 and Folate Panel  2. Lateral epicondylitis of right elbow Apply tennis elbow brace. Supportive measures reviewed. Needs to limit heavy lifting. Tylenol arthritis as directed. Follow-up if not resolving.    Leeanne Rio, PA-C

## 2018-01-30 NOTE — Patient Instructions (Signed)
Please go to the lab today for blood work.  I will call you with your results. We will alter treatment regimen(s) if indicated by your results.   Please use tylenol arthritis for the tennis elbow.  Get a brace as discussed to wear. Limit heavy lifting or overexertion.    Tennis Elbow Tennis elbow is puffiness (inflammation) of the outer tendons of your forearm close to your elbow. Your tendons attach your muscles to your bones. Tennis elbow can happen in any sport or job in which you use your elbow too much. It is caused by doing the same motion over and over. Tennis elbow can cause:  Pain and tenderness in your forearm and the outer part of your elbow.  A burning feeling. This runs from your elbow through your arm.  Weak grip in your hands.  Follow these instructions at home: Activity  Rest your elbow and wrist as told by your doctor. Try to avoid any activities that caused the problem until your doctor says that you can do them again.  If a physical therapist teaches you exercises, do all of them as told.  If you lift an object, lift it with your palm facing up. This is easier on your elbow. Lifestyle  If your tennis elbow is caused by sports, check your equipment and make sure that: ? You are using it correctly. ? It fits you well.  If your tennis elbow is caused by work, take breaks often, if you are able. Talk with your manager about doing your work in a way that is safe for you. ? If your tennis elbow is caused by computer use, talk with your manager about any changes that can be made to your work setup. General instructions  If told, apply ice to the painful area: ? Put ice in a plastic bag. ? Place a towel between your skin and the bag. ? Leave the ice on for 20 minutes, 2-3 times per day.  Take medicines only as told by your doctor.  If you were given a brace, wear it as told by your doctor.  Keep all follow-up visits as told by your doctor. This is  important. Contact a doctor if:  Your pain does not get better with treatment.  Your pain gets worse.  You have weakness in your forearm, hand, or fingers.  You cannot feel your forearm, hand, or fingers. This information is not intended to replace advice given to you by your health care provider. Make sure you discuss any questions you have with your health care provider. Document Released: 03/01/2010 Document Revised: 05/11/2016 Document Reviewed: 09/07/2014 Elsevier Interactive Patient Education  2018 [employer].   Sciatica Rehab Ask your health care provider which exercises are safe for you. Do exercises exactly as told by your health care provider and adjust them as directed. It is normal to feel mild stretching, pulling, tightness, or discomfort as you do these exercises, but you should stop right away if you feel sudden pain or your pain gets worse.Do not begin these exercises until told by your health care provider. Stretching and range of motion exercises These exercises warm up your muscles and joints and improve the movement and flexibility of your hips and your back. These exercises also help to relieve pain, numbness, and tingling. Exercise A: Sciatic nerve glide 1. Sit in a chair with your head facing down toward your chest. Place your hands behind your back. Let your shoulders slump forward. 2. Slowly straighten one  of your knees while you tilt your head back as if you are looking toward the ceiling. Only straighten your leg as far as you can without making your symptoms worse. 3. Hold for __________ seconds. 4. Slowly return to the starting position. 5. Repeat with your other leg. Repeat __________ times. Complete this exercise __________ times a day. Exercise B: Knee to chest with hip adduction and internal rotation  1. Lie on your back on a firm surface with both legs straight. 2. Bend one of your knees and move it up toward your chest until you feel a gentle  stretch in your lower back and buttock. Then, move your knee toward the shoulder that is on the opposite side from your leg. ? Hold your leg in this position by holding onto the front of your knee. 3. Hold for __________ seconds. 4. Slowly return to the starting position. 5. Repeat with your other leg. Repeat __________ times. Complete this exercise __________ times a day. Exercise C: Prone extension on elbows  1. Lie on your abdomen on a firm surface. A bed may be too soft for this exercise. 2. Prop yourself up on your elbows. 3. Use your arms to help lift your chest up until you feel a gentle stretch in your abdomen and your lower back. ? This will place some of your body weight on your elbows. If this is uncomfortable, try stacking pillows under your chest. ? Your hips should stay down, against the surface that you are lying on. Keep your hip and back muscles relaxed. 4. Hold for __________ seconds. 5. Slowly relax your upper body and return to the starting position. Repeat __________ times. Complete this exercise __________ times a day. Strengthening exercises These exercises build strength and endurance in your back. Endurance is the ability to use your muscles for a long time, even after they get tired. Exercise D: Pelvic tilt 1. Lie on your back on a firm surface. Bend your knees and keep your feet flat. 2. Tense your abdominal muscles. Tip your pelvis up toward the ceiling and flatten your lower back into the floor. ? To help with this exercise, you may place a small towel under your lower back and try to push your back into the towel. 3. Hold for __________ seconds. 4. Let your muscles relax completely before you repeat this exercise. Repeat __________ times. Complete this exercise __________ times a day. Exercise E: Alternating arm and leg raises  1. Get on your hands and knees on a firm surface. If you are on a hard floor, you may want to use padding to cushion your knees, such  as an exercise mat. 2. Line up your arms and legs. Your hands should be below your shoulders, and your knees should be below your hips. 3. Lift your left leg behind you. At the same time, raise your right arm and straighten it in front of you. ? Do not lift your leg higher than your hip. ? Do not lift your arm higher than your shoulder. ? Keep your abdominal and back muscles tight. ? Keep your hips facing the ground. ? Do not arch your back. ? Keep your balance carefully, and do not hold your breath. 4. Hold for __________ seconds. 5. Slowly return to the starting position and repeat with your right leg and your left arm. Repeat __________ times. Complete this exercise __________ times a day. Posture and body mechanics  Body mechanics refers to the movements and positions of your body  while you do your daily activities. Posture is part of body mechanics. Good posture and healthy body mechanics can help to relieve stress in your body's tissues and joints. Good posture means that your spine is in its natural S-curve position (your spine is neutral), your shoulders are pulled back slightly, and your head is not tipped forward. The following are general guidelines for applying improved posture and body mechanics to your everyday activities. Standing   When standing, keep your spine neutral and your feet about hip-width apart. Keep a slight bend in your knees. Your ears, shoulders, and hips should line up.  When you do a task in which you stand in one place for a long time, place one foot up on a stable object that is 2-4 inches (5-10 cm) high, such as a footstool. This helps keep your spine neutral. Sitting   When sitting, keep your spine neutral and keep your feet flat on the floor. Use a footrest, if necessary, and keep your thighs parallel to the floor. Avoid rounding your shoulders, and avoid tilting your head forward.  When working at a desk or a computer, keep your desk at a height where  your hands are slightly lower than your elbows. Slide your chair under your desk so you are close enough to maintain good posture.  When working at a computer, place your monitor at a height where you are looking straight ahead and you do not have to tilt your head forward or downward to look at the screen. Resting   When lying down and resting, avoid positions that are most painful for you.  If you have pain with activities such as sitting, bending, stooping, or squatting (flexion-based activities), lie in a position in which your body does not bend very much. For example, avoid curling up on your side with your arms and knees near your chest (fetal position).  If you have pain with activities such as standing for a long time or reaching with your arms (extension-based activities), lie with your spine in a neutral position and bend your knees slightly. Try the following positions: ? Lying on your side with a pillow between your knees. ? Lying on your back with a pillow under your knees. Lifting   When lifting objects, keep your feet at least shoulder-width apart and tighten your abdominal muscles.  Bend your knees and hips and keep your spine neutral. It is important to lift using the strength of your legs, not your back. Do not lock your knees straight out.  Always ask for help to lift heavy or awkward objects. This information is not intended to replace advice given to you by your health care provider. Make sure you discuss any questions you have with your health care provider. Document Released: 09/11/2005 Document Revised: 05/18/2016 Document Reviewed: 05/28/2015 Elsevier Interactive Patient Education  [employer].

## 2018-01-31 ENCOUNTER — Other Ambulatory Visit (INDEPENDENT_AMBULATORY_CARE_PROVIDER_SITE_OTHER): Payer: 59

## 2018-01-31 DIAGNOSIS — R7989 Other specified abnormal findings of blood chemistry: Secondary | ICD-10-CM

## 2018-01-31 LAB — T4, FREE: FREE T4: 0.79 ng/dL (ref 0.60–1.60)

## 2018-02-06 ENCOUNTER — Encounter: Payer: Self-pay | Admitting: Emergency Medicine

## 2018-03-13 ENCOUNTER — Other Ambulatory Visit: Payer: Self-pay | Admitting: Physician Assistant

## 2018-06-12 ENCOUNTER — Encounter: Payer: Self-pay | Admitting: Emergency Medicine

## 2018-06-12 LAB — HM PAP SMEAR: HM Pap smear: NEGATIVE

## 2018-06-13 LAB — HM MAMMOGRAPHY

## 2018-07-04 ENCOUNTER — Encounter: Payer: Self-pay | Admitting: Gastroenterology

## 2018-07-25 ENCOUNTER — Encounter: Payer: Self-pay | Admitting: Gastroenterology

## 2018-07-25 ENCOUNTER — Ambulatory Visit (AMBULATORY_SURGERY_CENTER): Payer: Self-pay

## 2018-07-25 VITALS — Ht 66.0 in | Wt 192.8 lb

## 2018-07-25 DIAGNOSIS — Z1211 Encounter for screening for malignant neoplasm of colon: Secondary | ICD-10-CM

## 2018-07-25 MED ORDER — NA SULFATE-K SULFATE-MG SULF 17.5-3.13-1.6 GM/177ML PO SOLN: 1.0000 | Freq: Once | ORAL | 0 refills | Status: AC

## 2018-07-25 NOTE — Progress Notes (Signed)
No egg or soy allergy known to patient  No issues with past sedation with any surgeries  or procedures, no intubation problems  No diet pills per patient No home 02 use per patient  No blood thinners per patient  Pt denies issues with constipation  No A fib or A flutter  EMMI video sent to pt's e mail  Pt. declined 

## 2018-08-09 ENCOUNTER — Ambulatory Visit (AMBULATORY_SURGERY_CENTER): Payer: 59 | Admitting: Gastroenterology

## 2018-08-09 ENCOUNTER — Encounter: Payer: Self-pay | Admitting: Gastroenterology

## 2018-08-09 VITALS — BP 115/52 | HR 57 | Temp 98.6°F | Resp 13 | Ht 66.0 in | Wt 192.0 lb

## 2018-08-09 DIAGNOSIS — Z1211 Encounter for screening for malignant neoplasm of colon: Secondary | ICD-10-CM | POA: Diagnosis not present

## 2018-08-09 DIAGNOSIS — K6289 Other specified diseases of anus and rectum: Secondary | ICD-10-CM

## 2018-08-09 DIAGNOSIS — D129 Benign neoplasm of anus and anal canal: Secondary | ICD-10-CM

## 2018-08-09 DIAGNOSIS — D128 Benign neoplasm of rectum: Secondary | ICD-10-CM | POA: Diagnosis not present

## 2018-08-09 MED ORDER — SODIUM CHLORIDE 0.9 % IV SOLN: 500.0000 mL | Freq: Once | INTRAVENOUS | Status: DC

## 2018-08-09 NOTE — Progress Notes (Signed)
Called to room to assist during endoscopic procedure.  Patient ID and intended procedure confirmed with present staff. Received instructions for my participation in the procedure from the performing physician.  

## 2018-08-09 NOTE — Patient Instructions (Signed)
Handouts: Polyps  YOU HAD AN ENDOSCOPIC PROCEDURE TODAY AT THE [facility]:   Refer to the procedure report that was given to you for any specific questions about what was found during the examination.  If the procedure report does not answer your questions, please call your gastroenterologist to clarify.  If you requested that your care partner not be given the details of your procedure findings, then the procedure report has been included in a sealed envelope for you to review at your convenience later.  YOU SHOULD EXPECT: Some feelings of bloating in the abdomen. Passage of more gas than usual.  Walking can help get rid of the air that was put into your GI tract during the procedure and reduce the bloating. If you had a lower endoscopy (such as a colonoscopy or flexible sigmoidoscopy) you may notice spotting of blood in your stool or on the toilet paper. If you underwent a bowel prep for your procedure, you may not have a normal bowel movement for a few days.  Please Note:  You might notice some irritation and congestion in your nose or some drainage.  This is from the oxygen used during your procedure.  There is no need for concern and it should clear up in a day or so.  SYMPTOMS TO REPORT IMMEDIATELY:   Following lower endoscopy (colonoscopy or flexible sigmoidoscopy):  Excessive amounts of blood in the stool  Significant tenderness or worsening of abdominal pains  Swelling of the abdomen that is new, acute  Fever of 100F or higher  For urgent or emergent issues, a gastroenterologist can be reached at any hour by calling [phone].   DIET:  We do recommend a small meal at first, but then you may proceed to your regular diet.  Drink plenty of fluids but you should avoid alcoholic beverages for 24 hours.  ACTIVITY:  You should plan to take it easy for the rest of today and you should NOT DRIVE or use heavy machinery until tomorrow (because of the sedation medicines used  during the test).    FOLLOW UP: Our staff will call the number listed on your records the next business day following your procedure to check on you and address any questions or concerns that you may have regarding the information given to you following your procedure. If we do not reach you, we will leave a message.  However, if you are feeling well and you are not experiencing any problems, there is no need to return our call.  We will assume that you have returned to your regular daily activities without incident.  If any biopsies were taken you will be contacted by phone or by letter within the next 1-3 weeks.  Please call us at [phone] if you have not heard about the biopsies in 3 weeks.    SIGNATURES/CONFIDENTIALITY: You and/or your care partner have signed paperwork which will be entered into your electronic medical record.  These signatures attest to the fact that that the information above on your After Visit Summary has been reviewed and is understood.  Full responsibility of the confidentiality of this discharge information lies with you and/or your care-partner. 

## 2018-08-09 NOTE — Op Note (Signed)
[facility] Patient Name: Maria Glenn Procedure Date: 08/09/2018 1:29 PM MRN: [id number] Endoscopist: Gerrit Heck , MD Age: 52 Referring MD:  Date of Birth: 03/25/1966 Gender: Female Account #: [id number] Procedure:                Colonoscopy Indications:              Screening for colorectal malignant neoplasm, This                            is the patient's first colonoscopy Medicines:                Monitored Anesthesia Care Procedure:                Pre-Anesthesia Assessment:                           - Prior to the procedure, a History and Physical                            was performed, and patient medications and                            allergies were reviewed. The patient's tolerance of                            previous anesthesia was also reviewed. The risks                            and benefits of the procedure and the sedation                            options and risks were discussed with the patient.                            All questions were answered, and informed consent                            was obtained. Prior Anticoagulants: The patient has                            taken aspirin, last dose was 1 day prior to                            procedure. ASA Grade Assessment: II - A patient                            with mild systemic disease. After reviewing the                            risks and benefits, the patient was deemed in                            satisfactory condition to undergo the procedure.  After obtaining informed consent, the colonoscope                            was passed under direct vision. Throughout the                            procedure, the patient's blood pressure, pulse, and                            oxygen saturations were monitored continuously. The                            Colonoscope was introduced through the anus and                            advanced to the the cecum,  identified by                            appendiceal orifice and ileocecal valve. The                            colonoscopy was performed without difficulty. The                            patient tolerated the procedure well. The quality                            of the bowel preparation was adequate. Scope In: 1:38:48 PM Scope Out: 1:54:20 PM Scope Withdrawal Time: 0 hours 12 minutes 45 seconds  Total Procedure Duration: 0 hours 15 minutes 32 seconds  Findings:                 The perianal and digital rectal examinations were                            normal.                           A 2 mm polyp was found in the rectum. The polyp was                            sessile. The polyp was removed with a cold biopsy                            forceps. Resection and retrieval were complete.                            Estimated blood loss was minimal.                           Anal papilla(e) were hypertrophied. Biopsies were                            taken with a cold forceps for histologic  verification. Estimated blood loss was minimal.                           The exam was otherwise normal throughout the                            examined colon.                           Retroflexion in the right colon was performed. Complications:            No immediate complications. Estimated Blood Loss:     Estimated blood loss was minimal. Impression:               - One 2 mm polyp in the rectum, removed with a cold                            biopsy forceps. Resected and retrieved.                           - Anal papilla(e) were hypertrophied. Biopsied. Recommendation:           - Patient has a contact number available for                            emergencies. The signs and symptoms of potential                            delayed complications were discussed with the                            patient. Return to normal activities tomorrow.                             Written discharge instructions were provided to the                            patient.                           - Resume previous diet today.                           - Continue present medications.                           - Await pathology results.                           - Repeat colonoscopy in 5-10 years for surveillance                            based on pathology results.                           - Return to GI office PRN. Gerrit Heck, MD 08/09/2018 1:58:25 PM

## 2018-08-09 NOTE — Progress Notes (Signed)
To PACU, VSS. Report to RN.tb 

## 2018-08-12 ENCOUNTER — Telehealth: Payer: Self-pay

## 2018-08-12 NOTE — Telephone Encounter (Signed)
  Follow up Call-  Call back number 08/09/2018  Post procedure Call Back phone  # [phone]  Permission to leave phone message Yes  Some recent data might be hidden     Left message

## 2018-08-12 NOTE — Telephone Encounter (Signed)
Attempted to reach pt. With follow-up call following endoscopic procedure 08/09/2018.  LM on pt. Voice mail to call if she has any questions or concerns.

## 2018-08-20 ENCOUNTER — Encounter: Payer: Self-pay | Admitting: Gastroenterology

## 2018-09-06 ENCOUNTER — Encounter: Payer: Self-pay | Admitting: Physician Assistant

## 2018-09-10 ENCOUNTER — Other Ambulatory Visit: Payer: Self-pay

## 2018-09-10 ENCOUNTER — Ambulatory Visit: Payer: 59 | Admitting: Physician Assistant

## 2018-09-10 ENCOUNTER — Encounter: Payer: Self-pay | Admitting: Emergency Medicine

## 2018-09-10 ENCOUNTER — Encounter: Payer: Self-pay | Admitting: Physician Assistant

## 2018-09-10 VITALS — BP 138/76 | HR 101 | Temp 98.8°F | Resp 16 | Ht 66.0 in | Wt 181.6 lb

## 2018-09-10 DIAGNOSIS — E039 Hypothyroidism, unspecified: Secondary | ICD-10-CM

## 2018-09-10 LAB — TSH: TSH: 1.03 u[IU]/mL (ref 0.35–4.50)

## 2018-09-10 NOTE — Progress Notes (Signed)
Patient presents to clinic today for follow-up of hypothyroidism. Is currently on a regimen of Levothyroxine 88 mcg daily. Endorses taking daily as directed. Notes feeling good overall.   Past Medical History:  Diagnosis Date  . Allergy   . Clotting disorder ([place])    factor V Leiden  . DVT (deep venous thrombosis) ([place])   . Factor V Leiden ([place])   . Left leg DVT ([place]) 08/09/2015  . Thyroid disease     Current Outpatient Medications on File Prior to Visit  Medication Sig Dispense Refill  . aspirin 81 MG chewable tablet Chew 81 mg by mouth daily.    Marland Kitchen levothyroxine (SYNTHROID, LEVOTHROID) 88 MCG tablet TAKE 1 TABLET BY MOUTH ONCE DAILY 90 tablet 1   No current facility-administered medications on file prior to visit.     Allergies  Allergen Reactions  . Penicillins Shortness Of Breath    Family History  Problem Relation Age of Onset  . Cancer Father        Lung  . Colon cancer Neg Hx   . Colon polyps Neg Hx   . Esophageal cancer Neg Hx   . Stomach cancer Neg Hx   . Rectal cancer Neg Hx     Social History   Socioeconomic History  . Marital status: Married    Spouse name: Not on file  . Number of children: Not on file  . Years of education: Not on file  . Highest education level: Not on file  Occupational History  . Not on file  Social Needs  . Financial resource strain: Not on file  . Food insecurity:    Worry: Not on file    Inability: Not on file  . Transportation needs:    Medical: Not on file    Non-medical: Not on file  Tobacco Use  . Smoking status: Never Smoker  . Smokeless tobacco: Never Used  Substance and Sexual Activity  . Alcohol use: No    Alcohol/week: 0.0 standard drinks  . Drug use: No  . Sexual activity: Yes    Birth control/protection: Other-see comments    Comment: husband had vasectomy  Lifestyle  . Physical activity:    Days per week: Not on file    Minutes per session: Not on file  . Stress: Not on file  Relationships  .  Social connections:    Talks on phone: Not on file    Gets together: Not on file    Attends religious service: Not on file    Active member of club or organization: Not on file    Attends meetings of clubs or organizations: Not on file    Relationship status: Not on file  Other Topics Concern  . Not on file  Social History Narrative  . Not on file   Review of Systems - See HPI.  All other ROS are negative.  BP 138/76   Pulse (!) 101   Temp 98.8 F (37.1 C) (Oral)   Resp 16   Ht 5\' 6"  (1.676 m)   Wt 181 lb 9.6 oz (82.4 kg)   SpO2 98%   BMI 29.31 kg/m   Physical Exam Vitals signs reviewed.  Constitutional:      Appearance: Normal appearance.  HENT:     Head: Normocephalic and atraumatic.  Cardiovascular:     Rate and Rhythm: Normal rate and regular rhythm.     Pulses: Normal pulses.     Heart sounds: Normal heart sounds.  Pulmonary:  Effort: Pulmonary effort is normal.     Breath sounds: Normal breath sounds.  Neurological:     Mental Status: She is alert.  Psychiatric:        Mood and Affect: Mood normal.    Assessment/Plan: Acquired hypothyroidism Repeat TSH level today. Will alter dose of levothyroxine accordingly.     Leeanne Rio, PA-C

## 2018-09-10 NOTE — Patient Instructions (Signed)
Please go to the lab today for blood work.  I will call you with your results. We will alter treatment regimen(s) if indicated by your results.   I am waiting on a response from Dr. Marin Olp regarding recommendation of agent for anticoagulation. We definitely need you to wear the compression stockings and to move around while on the plane.

## 2018-09-11 ENCOUNTER — Other Ambulatory Visit: Payer: Self-pay | Admitting: Physician Assistant

## 2018-09-11 ENCOUNTER — Telehealth: Payer: Self-pay

## 2018-09-11 DIAGNOSIS — E039 Hypothyroidism, unspecified: Secondary | ICD-10-CM | POA: Insufficient documentation

## 2018-09-11 MED ORDER — RIVAROXABAN 20 MG PO TABS: 20.0000 mg | ORAL_TABLET | Freq: Every day | ORAL | 0 refills | Status: DC

## 2018-09-11 NOTE — Telephone Encounter (Signed)
Copied from [place] [id number]. Topic: General - Inquiry >> Sep 11, 2018 12:41 PM Virl Axe D wrote: Reason for CRM: Pt following up to see if her lab results were ready and also if Einar Pheasant has spoke with a hematologist regarding pt's upcoming flight. Please advise [phone]

## 2018-09-11 NOTE — Assessment & Plan Note (Signed)
Repeat TSH level today. Will alter dose of levothyroxine accordingly.

## 2018-09-11 NOTE — Telephone Encounter (Signed)
Called patient LMOVM advising of lab results. CRM created and ok for [facility] triage nurse to give results and recommendations

## 2018-11-05 ENCOUNTER — Ambulatory Visit: Payer: Self-pay | Admitting: *Deleted

## 2018-11-05 NOTE — Telephone Encounter (Signed)
Patient called to say that she may need to go and visit her mom again who is in the hospital. She is asking for a call back from Dr [name] nurse regarding some Xarelto said that he gave her samples when she was going away on the last visit so she would not get any blood clots. Please call Ph# [phone]   Patient is not sure when leaving- she wants to be prepared to go in case she is called to go. Mother is in [place] and she was diagnosed with Flu- she is very ill and patient wants to be ready to leave quickly. Patient is requesting samples.  Reason for Disposition . Caller has URGENT medication question about med that PCP prescribed and triager unable to answer question    Patient is requesting samples of Gwenyth Bouillon to take with her - she may have to leave town quickly and wants to have them on hand.  Protocols used: MEDICATION QUESTION CALL-A-AH

## 2018-11-05 NOTE — Telephone Encounter (Signed)
Please advise if this is a reoccurring thing for patient to take when traveling on plane?

## 2018-11-06 ENCOUNTER — Telehealth: Payer: Self-pay | Admitting: Emergency Medicine

## 2018-11-06 NOTE — Telephone Encounter (Signed)
Copied from [place] [id number]. Topic: General - Inquiry >> Nov 06, 2018  9:51 AM Ahmed Prima L wrote: Reason for CRM: patient calling to follow up on triage encounter from yesterday. She would like the nurse to call her today [phone]

## 2018-11-06 NOTE — Telephone Encounter (Signed)
Ok to give her samples again of 20 mg. See medication list -- 1 tablet daily. Ok to give 2 weeks of tablets of sample. Use as directed the same as last time.

## 2018-11-06 NOTE — Telephone Encounter (Signed)
Previous telephone encounter created.

## 2018-11-06 NOTE — Telephone Encounter (Signed)
I have pulled medication and logged them out of our sample book.  Patient is aware and she is coming by the office to pick them up.

## 2018-11-06 NOTE — Telephone Encounter (Signed)
Patient requesting a call back before 3pm.

## 2018-11-12 ENCOUNTER — Ambulatory Visit: Payer: 59 | Admitting: Physician Assistant

## 2018-11-13 ENCOUNTER — Other Ambulatory Visit: Payer: Self-pay

## 2018-11-13 ENCOUNTER — Ambulatory Visit: Payer: Self-pay | Admitting: *Deleted

## 2018-11-13 MED ORDER — OSELTAMIVIR PHOSPHATE 75 MG PO CAPS: 75.0000 mg | ORAL_CAPSULE | Freq: Every day | ORAL | 0 refills | Status: AC

## 2018-11-13 NOTE — Telephone Encounter (Signed)
I am ok with sending in the Tamiflu as a preventive dose -- 75 mg once daily x 10 days. Will be up to the out of state pharmacist whether or not they will fill. She may have to be seen at Urgent Care there.

## 2018-11-13 NOTE — Addendum Note (Signed)
Addended by: Doran Clay A on: 11/13/2018 01:47 PM   Modules accepted: Orders

## 2018-11-13 NOTE — Telephone Encounter (Signed)
Pt requesting Tamiflu. States is out of state "My mother is currently dying from the flu." States she is with her siblings who have also been diagnosed  with flu. Pt has no symptoms presently. If appropriate:  [facility] [address]. [phone] Pt's CB: [phone]  Reason for Disposition . Caller requesting a NON-URGENT new prescription or refill and triager unable to refill per unit policy  Answer Assessment - Initial Assessment Questions 1. SYMPTOMS: "Do you have any symptoms?"     no 2. SEVERITY: If symptoms are present, ask "Are they mild, moderate or severe?"     n/a  Protocols used: MEDICATION QUESTION CALL-A-AH

## 2019-03-11 ENCOUNTER — Other Ambulatory Visit: Payer: Self-pay | Admitting: Physician Assistant

## 2019-06-09 ENCOUNTER — Other Ambulatory Visit: Payer: Self-pay | Admitting: Physician Assistant

## 2019-07-16 ENCOUNTER — Ambulatory Visit (INDEPENDENT_AMBULATORY_CARE_PROVIDER_SITE_OTHER): Payer: 59 | Admitting: Physician Assistant

## 2019-07-16 ENCOUNTER — Other Ambulatory Visit: Payer: Self-pay

## 2019-07-16 ENCOUNTER — Encounter: Payer: Self-pay | Admitting: Physician Assistant

## 2019-07-16 VITALS — BP 108/64 | HR 77 | Temp 98.3°F | Resp 16 | Ht 66.0 in | Wt 178.0 lb

## 2019-07-16 DIAGNOSIS — Z0000 Encounter for general adult medical examination without abnormal findings: Secondary | ICD-10-CM

## 2019-07-16 DIAGNOSIS — Z23 Encounter for immunization: Secondary | ICD-10-CM | POA: Diagnosis not present

## 2019-07-16 DIAGNOSIS — E039 Hypothyroidism, unspecified: Secondary | ICD-10-CM | POA: Diagnosis not present

## 2019-07-16 LAB — CBC WITH DIFFERENTIAL/PLATELET
Basophils Absolute: 0 10*3/uL (ref 0.0–0.1)
Basophils Relative: 0.9 % (ref 0.0–3.0)
Eosinophils Absolute: 0.3 10*3/uL (ref 0.0–0.7)
Eosinophils Relative: 5.5 % — ABNORMAL HIGH (ref 0.0–5.0)
HCT: 38.6 % (ref 36.0–46.0)
Hemoglobin: 13.1 g/dL (ref 12.0–15.0)
Lymphocytes Relative: 41.6 % (ref 12.0–46.0)
Lymphs Abs: 1.9 10*3/uL (ref 0.7–4.0)
MCHC: 33.9 g/dL (ref 30.0–36.0)
MCV: 87.7 fl (ref 78.0–100.0)
Monocytes Absolute: 0.4 10*3/uL (ref 0.1–1.0)
Monocytes Relative: 8.2 % (ref 3.0–12.0)
Neutro Abs: 2 10*3/uL (ref 1.4–7.7)
Neutrophils Relative %: 43.8 % (ref 43.0–77.0)
Platelets: 192 10*3/uL (ref 150.0–400.0)
RBC: 4.4 Mil/uL (ref 3.87–5.11)
RDW: 14.2 % (ref 11.5–15.5)
WBC: 4.6 10*3/uL (ref 4.0–10.5)

## 2019-07-16 LAB — TSH: TSH: 1.56 u[IU]/mL (ref 0.35–4.50)

## 2019-07-16 LAB — HEMOGLOBIN A1C: Hgb A1c MFr Bld: 5.7 % (ref 4.6–6.5)

## 2019-07-16 LAB — LIPID PANEL
Cholesterol: 217 mg/dL — ABNORMAL HIGH (ref 0–200)
HDL: 59.5 mg/dL (ref >39.00)
LDL Cholesterol: 147 mg/dL — ABNORMAL HIGH (ref 0–99)
NonHDL: 157.52
Total CHOL/HDL Ratio: 4
Triglycerides: 54 mg/dL (ref 0.0–149.0)
VLDL: 10.8 mg/dL (ref 0.0–40.0)

## 2019-07-16 LAB — COMPREHENSIVE METABOLIC PANEL
ALT: 13 U/L (ref 0–35)
AST: 15 U/L (ref 0–37)
Albumin: 4.3 g/dL (ref 3.5–5.2)
Alkaline Phosphatase: 44 U/L (ref 39–117)
BUN: 13 mg/dL (ref 6–23)
CO2: 26 mEq/L (ref 19–32)
Calcium: 9.2 mg/dL (ref 8.4–10.5)
Chloride: 104 mEq/L (ref 96–112)
Creatinine, Ser: 0.69 mg/dL (ref 0.40–1.20)
GFR: 89.5 mL/min (ref >60.00)
Glucose, Bld: 92 mg/dL (ref 70–99)
Potassium: 4.2 mEq/L (ref 3.5–5.1)
Sodium: 139 mEq/L (ref 135–145)
Total Bilirubin: 0.7 mg/dL (ref 0.2–1.2)
Total Protein: 6.6 g/dL (ref 6.0–8.3)

## 2019-07-16 NOTE — Patient Instructions (Signed)
Please go to the lab for blood work.   Our office will call you with your results unless you have chosen to receive results via MyChart.  If your blood work is normal we will follow-up each year for physicals and as scheduled for chronic medical problems.  If anything is abnormal we will treat accordingly and get you in for a follow-up.   Preventive Care 37-51 Years Old, Female Preventive care refers to visits with your health care provider and lifestyle choices that can promote health and wellness. This includes:  A yearly physical exam. This may also be called an annual well check.  Regular dental visits and eye exams.  Immunizations.  Screening for certain conditions.  Healthy lifestyle choices, such as eating a healthy diet, getting regular exercise, not using drugs or products that contain nicotine and tobacco, and limiting alcohol use. What can I expect for my preventive care visit? Physical exam Your health care provider will check your:  Height and weight. This may be used to calculate body mass index (BMI), which tells if you are at a healthy weight.  Heart rate and blood pressure.  Skin for abnormal spots. Counseling Your health care provider may ask you questions about your:  Alcohol, tobacco, and drug use.  Emotional well-being.  Home and relationship well-being.  Sexual activity.  Eating habits.  Work and work Statistician.  Method of birth control.  Menstrual cycle.  Pregnancy history. What immunizations do I need?  Influenza (flu) vaccine  This is recommended every year. Tetanus, diphtheria, and pertussis (Tdap) vaccine  You may need a Td booster every 10 years. Varicella (chickenpox) vaccine  You may need this if you have not been vaccinated. Zoster (shingles) vaccine  You may need this after age 36. Measles, mumps, and rubella (MMR) vaccine  You may need at least one dose of MMR if you were born in 1957 or later. You may also need a  second dose. Pneumococcal conjugate (PCV13) vaccine  You may need this if you have certain conditions and were not previously vaccinated. Pneumococcal polysaccharide (PPSV23) vaccine  You may need one or two doses if you smoke cigarettes or if you have certain conditions. Meningococcal conjugate (MenACWY) vaccine  You may need this if you have certain conditions. Hepatitis A vaccine  You may need this if you have certain conditions or if you travel or work in places where you may be exposed to hepatitis A. Hepatitis B vaccine  You may need this if you have certain conditions or if you travel or work in places where you may be exposed to hepatitis B. Haemophilus influenzae type b (Hib) vaccine  You may need this if you have certain conditions. Human papillomavirus (HPV) vaccine  If recommended by your health care provider, you may need three doses over 6 months. You may receive vaccines as individual doses or as more than one vaccine together in one shot (combination vaccines). Talk with your health care provider about the risks and benefits of combination vaccines. What tests do I need? Blood tests  Lipid and cholesterol levels. These may be checked every 5 years, or more frequently if you are over 95 years old.  Hepatitis C test.  Hepatitis B test. Screening  Lung cancer screening. You may have this screening every year starting at age 21 if you have a 30-pack-year history of smoking and currently smoke or have quit within the past 15 years.  Colorectal cancer screening. All adults should have this screening starting  at age 59 and continuing until age 23. Your health care provider may recommend screening at age 34 if you are at increased risk. You will have tests every 1-10 years, depending on your results and the type of screening test.  Diabetes screening. This is done by checking your blood sugar (glucose) after you have not eaten for a while (fasting). You may have this done  every 1-3 years.  Mammogram. This may be done every 1-2 years. Talk with your health care provider about when you should start having regular mammograms. This may depend on whether you have a family history of breast cancer.  BRCA-related cancer screening. This may be done if you have a family history of breast, ovarian, tubal, or peritoneal cancers.  Pelvic exam and Pap test. This may be done every 3 years starting at age 6. Starting at age 65, this may be done every 5 years if you have a Pap test in combination with an HPV test. Other tests  Sexually transmitted disease (STD) testing.  Bone density scan. This is done to screen for osteoporosis. You may have this scan if you are at high risk for osteoporosis. Follow these instructions at home: Eating and drinking  Eat a diet that includes fresh fruits and vegetables, whole grains, lean protein, and low-fat dairy.  Take vitamin and mineral supplements as recommended by your health care provider.  Do not drink alcohol if: ? Your health care provider tells you not to drink. ? You are pregnant, may be pregnant, or are planning to become pregnant.  If you drink alcohol: ? Limit how much you have to 0-1 drink a day. ? Be aware of how much alcohol is in your drink. In the [place] one drink equals one 12 oz bottle of beer (355 mL), one 5 oz glass of wine (148 mL), or one 1 oz glass of hard liquor (44 mL). Lifestyle  Take daily care of your teeth and gums.  Stay active. Exercise for at least 30 minutes on 5 or more days each week.  Do not use any products that contain nicotine or tobacco, such as cigarettes, e-cigarettes, and chewing tobacco. If you need help quitting, ask your health care provider.  If you are sexually active, practice safe sex. Use a condom or other form of birth control (contraception) in order to prevent pregnancy and STIs (sexually transmitted infections).  If told by your health care provider, take low-dose aspirin  daily starting at age 65. What's next?  Visit your health care provider once a year for a well check visit.  Ask your health care provider how often you should have your eyes and teeth checked.  Stay up to date on all vaccines. This information is not intended to replace advice given to you by your health care provider. Make sure you discuss any questions you have with your health care provider. Document Released: 10/08/2015 Document Revised: 05/23/2018 Document Reviewed: 05/23/2018 Elsevier Patient Education  2020 [employer]. .

## 2019-07-16 NOTE — Progress Notes (Signed)
Patient presents to clinic today for annual exam.  Patient is fasting for labs. Working on eating healthy. Exercising 5x per week at home, treadmill and hand weights.   Acute Concerns: No acute concerns today.   Chronic Issues: Hypothyroidism -- takes Levothyroxine daily, tolerates well. Has noted some hair thinning. Has family members that had the same issue along with taking generic thyroid medication and resolved once switching to branded medication.  History of DVT/Factor V Leiden -- takes aspirin 81 mg daily, no complications.   Health Maintenance: Immunizations -- Tetanus UTD. Interested in flu shot today.  Colonoscopy -- UTD Mammogram -- Due -- planning to schedule with GYN. PAP -- UTD  Past Medical History:  Diagnosis Date  . Allergy   . Clotting disorder ([place])    factor V Leiden  . DVT (deep venous thrombosis) ([place])   . Factor V Leiden ([place])   . Left leg DVT ([place]) 08/09/2015  . Thyroid disease     Past Surgical History:  Procedure Laterality Date  . CESAREAN SECTION      Current Outpatient Medications on File Prior to Visit  Medication Sig Dispense Refill  . aspirin 81 MG chewable tablet Chew 81 mg by mouth daily.    Marland Kitchen levothyroxine (EUTHYROX) 88 MCG tablet Take 1 tablet (88 mcg total) by mouth daily. Please schedule follow up with Elyn Aquas, PA for further refills. [phone] 30 tablet 0  . rivaroxaban (XARELTO) 20 MG TABS tablet Take 1 tablet (20 mg total) by mouth daily with supper. (Patient not taking: Reported on 07/16/2019) 14 tablet 0   No current facility-administered medications on file prior to visit.     Allergies  Allergen Reactions  . Penicillins Shortness Of Breath    Family History  Problem Relation Age of Onset  . Cancer Father        Lung  . Colon cancer Neg Hx   . Colon polyps Neg Hx   . Esophageal cancer Neg Hx   . Stomach cancer Neg Hx   . Rectal cancer Neg Hx     Social History   Socioeconomic History  . Marital  status: Married    Spouse name: Not on file  . Number of children: Not on file  . Years of education: Not on file  . Highest education level: Not on file  Occupational History  . Not on file  Social Needs  . Financial resource strain: Not on file  . Food insecurity    Worry: Not on file    Inability: Not on file  . Transportation needs    Medical: Not on file    Non-medical: Not on file  Tobacco Use  . Smoking status: Never Smoker  . Smokeless tobacco: Never Used  Substance and Sexual Activity  . Alcohol use: No    Alcohol/week: 0.0 standard drinks  . Drug use: No  . Sexual activity: Yes    Birth control/protection: Other-see comments    Comment: husband had vasectomy  Lifestyle  . Physical activity    Days per week: Not on file    Minutes per session: Not on file  . Stress: Not on file  Relationships  . Social Herbalist on phone: Not on file    Gets together: Not on file    Attends religious service: Not on file    Active member of club or organization: Not on file    Attends meetings of clubs or organizations: Not on  file    Relationship status: Not on file  . Intimate partner violence    Fear of current or ex partner: Not on file    Emotionally abused: Not on file    Physically abused: Not on file    Forced sexual activity: Not on file  Other Topics Concern  . Not on file  Social History Narrative  . Not on file   Review of Systems  Constitutional: Negative for chills, fever and weight loss.  HENT: Negative for hearing loss and tinnitus.   Eyes: Negative for blurred vision and double vision.  Respiratory: Negative for cough and shortness of breath.   Cardiovascular: Negative for chest pain and palpitations.  Gastrointestinal: Negative for abdominal pain, constipation, diarrhea, nausea and vomiting.  Genitourinary: Negative for dysuria, frequency and urgency.  Neurological: Negative for dizziness and headaches.  Psychiatric/Behavioral: Negative  for depression. The patient is not nervous/anxious and does not have insomnia.    BP 108/64   Pulse 77   Temp 98.3 F (36.8 C) (Temporal)   Resp 16   Ht 5\' 6"  (1.676 m)   Wt 178 lb (80.7 kg)   SpO2 99%   BMI 28.73 kg/m   Physical Exam Vitals signs reviewed.  Constitutional:      Appearance: Normal appearance.  HENT:     Head: Normocephalic and atraumatic.     Right Ear: Tympanic membrane, ear canal and external ear normal.     Left Ear: Tympanic membrane, ear canal and external ear normal.     Nose: Nose normal. No mucosal edema.     Mouth/Throat:     Pharynx: Uvula midline. No oropharyngeal exudate or posterior oropharyngeal erythema.  Eyes:     Conjunctiva/sclera: Conjunctivae normal.     Pupils: Pupils are equal, round, and reactive to light.  Neck:     Musculoskeletal: Neck supple.     Thyroid: No thyromegaly.  Cardiovascular:     Rate and Rhythm: Normal rate and regular rhythm.     Pulses: Normal pulses.     Heart sounds: Normal heart sounds.  Pulmonary:     Effort: Pulmonary effort is normal. No respiratory distress.     Breath sounds: Normal breath sounds. No wheezing or rales.  Abdominal:     General: Bowel sounds are normal. There is no distension.     Palpations: Abdomen is soft. There is no mass.     Tenderness: There is no abdominal tenderness. There is no guarding or rebound.  Lymphadenopathy:     Cervical: No cervical adenopathy.  Skin:    General: Skin is warm and dry.     Findings: No rash.  Neurological:     Mental Status: She is alert and oriented to person, place, and time.     Cranial Nerves: No cranial nerve deficit.     Assessment/Plan: 1. Visit for preventive health examination Depression screen negative. Health Maintenance reviewed -- Flu shot updated today. Scheduling mammogram with GYN. PAP UTD. Colonoscopy UTD. Preventive schedule discussed and handout given in AVS. Will obtain fasting labs today.  - CBC with Differential/Platelet  - Comprehensive metabolic panel - Hemoglobin A1c - Lipid panel - TSH  2. Acquired hypothyroidism Repeat TSH today. No significant hair loss noted on examination. Will make sure she is euthroid first. Then can consider trial of branded medication.   3. Need for immunization against influenza - Flu Vaccine QUAD 36+ mos IM    Leeanne Rio, PA-C

## 2019-07-17 ENCOUNTER — Telehealth: Payer: Self-pay | Admitting: Physician Assistant

## 2019-07-17 ENCOUNTER — Other Ambulatory Visit: Payer: Self-pay

## 2019-07-17 MED ORDER — LEVOTHYROXINE SODIUM 88 MCG PO TABS: 88.0000 ug | ORAL_TABLET | Freq: Every day | ORAL | 0 refills | Status: DC

## 2019-07-17 NOTE — Telephone Encounter (Signed)
Error

## 2019-10-13 ENCOUNTER — Telehealth: Payer: Self-pay | Admitting: Physician Assistant

## 2019-10-13 ENCOUNTER — Other Ambulatory Visit: Payer: Self-pay | Admitting: Physician Assistant

## 2019-10-13 NOTE — Telephone Encounter (Signed)
Pt advised to schedule appointment with Lab for TSH recheck around January 14 2020 Verbalized understanding, stated will call back to schedule appointment

## 2019-10-13 NOTE — Telephone Encounter (Signed)
Pt called in asking when she should have her bw rechecked and when Einar Pheasant wanted to see her again. Please advise

## 2020-01-22 ENCOUNTER — Encounter: Payer: Self-pay | Admitting: Neurology

## 2020-01-22 ENCOUNTER — Encounter: Payer: Self-pay | Admitting: Physician Assistant

## 2020-01-22 ENCOUNTER — Ambulatory Visit (INDEPENDENT_AMBULATORY_CARE_PROVIDER_SITE_OTHER): Payer: 59 | Admitting: Physician Assistant

## 2020-01-22 ENCOUNTER — Other Ambulatory Visit: Payer: Self-pay

## 2020-01-22 VITALS — BP 122/74 | HR 68 | Temp 98.2°F | Resp 16 | Ht 65.8 in | Wt 201.2 lb

## 2020-01-22 DIAGNOSIS — Z0000 Encounter for general adult medical examination without abnormal findings: Secondary | ICD-10-CM

## 2020-01-22 DIAGNOSIS — G500 Trigeminal neuralgia: Secondary | ICD-10-CM | POA: Diagnosis not present

## 2020-01-22 DIAGNOSIS — Z1231 Encounter for screening mammogram for malignant neoplasm of breast: Secondary | ICD-10-CM | POA: Diagnosis not present

## 2020-01-22 DIAGNOSIS — Z0001 Encounter for general adult medical examination with abnormal findings: Secondary | ICD-10-CM

## 2020-01-22 DIAGNOSIS — R599 Enlarged lymph nodes, unspecified: Secondary | ICD-10-CM | POA: Diagnosis not present

## 2020-01-22 LAB — COMPREHENSIVE METABOLIC PANEL
ALT: 17 U/L (ref 0–35)
AST: 15 U/L (ref 0–37)
Albumin: 4.2 g/dL (ref 3.5–5.2)
Alkaline Phosphatase: 66 U/L (ref 39–117)
BUN: 12 mg/dL (ref 6–23)
CO2: 31 mEq/L (ref 19–32)
Calcium: 9.3 mg/dL (ref 8.4–10.5)
Chloride: 102 mEq/L (ref 96–112)
Creatinine, Ser: 0.68 mg/dL (ref 0.40–1.20)
GFR: 90.84 mL/min (ref >60.00)
Glucose, Bld: 104 mg/dL — ABNORMAL HIGH (ref 70–99)
Potassium: 4.5 mEq/L (ref 3.5–5.1)
Sodium: 140 mEq/L (ref 135–145)
Total Bilirubin: 0.4 mg/dL (ref 0.2–1.2)
Total Protein: 6.7 g/dL (ref 6.0–8.3)

## 2020-01-22 LAB — CBC WITH DIFFERENTIAL/PLATELET
Basophils Absolute: 0.1 10*3/uL (ref 0.0–0.1)
Basophils Relative: 1.3 % (ref 0.0–3.0)
Eosinophils Absolute: 0.3 10*3/uL (ref 0.0–0.7)
Eosinophils Relative: 6.5 % — ABNORMAL HIGH (ref 0.0–5.0)
HCT: 37.9 % (ref 36.0–46.0)
Hemoglobin: 12.7 g/dL (ref 12.0–15.0)
Lymphocytes Relative: 42.1 % (ref 12.0–46.0)
Lymphs Abs: 2.1 10*3/uL (ref 0.7–4.0)
MCHC: 33.6 g/dL (ref 30.0–36.0)
MCV: 88.9 fl (ref 78.0–100.0)
Monocytes Absolute: 0.4 10*3/uL (ref 0.1–1.0)
Monocytes Relative: 7.3 % (ref 3.0–12.0)
Neutro Abs: 2.1 10*3/uL (ref 1.4–7.7)
Neutrophils Relative %: 42.8 % — ABNORMAL LOW (ref 43.0–77.0)
Platelets: 204 10*3/uL (ref 150.0–400.0)
RBC: 4.26 Mil/uL (ref 3.87–5.11)
RDW: 14.7 % (ref 11.5–15.5)
WBC: 5 10*3/uL (ref 4.0–10.5)

## 2020-01-22 LAB — LIPID PANEL
Cholesterol: 264 mg/dL — ABNORMAL HIGH (ref 0–200)
HDL: 73.6 mg/dL (ref >39.00)
LDL Cholesterol: 178 mg/dL — ABNORMAL HIGH (ref 0–99)
NonHDL: 190.4
Total CHOL/HDL Ratio: 4
Triglycerides: 62 mg/dL (ref 0.0–149.0)
VLDL: 12.4 mg/dL (ref 0.0–40.0)

## 2020-01-22 LAB — TSH: TSH: 1.63 u[IU]/mL (ref 0.35–4.50)

## 2020-01-22 LAB — HEMOGLOBIN A1C: Hgb A1c MFr Bld: 5.5 % (ref 4.6–6.5)

## 2020-01-22 NOTE — Progress Notes (Signed)
Patient presents to clinic today for annual exam.  Patient is fasting for labs.  Patient's last physical was 07/14/2020.  Patient states she is aware of this but her insurance allows 1 physical per calendar year and she would like repeat physical today.  No current exercise.  Does maintain a well-balanced diet overall.   Acute Concerns: Patient requesting referral to neurology for potential trigeminal neuralgia.  Endorses several year history of intermittent episodes of burning and tingling of the left side of her face, specifically the frontal area and maxillary area.  When symptoms initially presented she was evaluated by both ENT and her dentist who both felt she had trigeminal neuralgia.  Notes CT at that time was unremarkable.  States she had no neurological assessment at that time despite recommendation by ENT.  States she really wants to avoid medication if possible just wants to discuss with neurology.  Notes she is asymptomatic at present.  Patient also endorses of lymph node or cyst of her left posterior neck that has been present over the past decade.  Had initial evaluation with dermatology and was told was likely a lymph node or cyst.  CT obtained in 2011 confirmed a dermal lymph node in the area.  No pathological characteristics noted.  Patient states she feels the area has gotten larger.  Denies any pain in the area.  Would like this reassessed.  Chronic Issues: Hypothyroidism --patient is currently on a regimen of Euthyrox 88 mcg daily.  Endorses taking medication daily as directed and tolerating well.  Is due for repeat TSH levels.  Health Maintenance: Immunizations -- UTD Colonoscopy -- UTD Mammogram -- Due. Patient is calling to schedule.  PAP -- UTD  Past Medical History:  Diagnosis Date  . Allergy   . Clotting disorder ([place])    factor V Leiden  . DVT (deep venous thrombosis) ([place])   . Factor V Leiden ([place])   . Left leg DVT ([place]) 08/09/2015  . Thyroid disease      Past Surgical History:  Procedure Laterality Date  . CESAREAN SECTION      Current Outpatient Medications on File Prior to Visit  Medication Sig Dispense Refill  . aspirin 81 MG chewable tablet Chew 81 mg by mouth daily.    Arna Medici 88 MCG tablet TAKE 1 TABLET BY MOUTH ONCE DAILY BEFORE BREAKFAST 90 tablet 0  . rivaroxaban (XARELTO) 20 MG TABS tablet Take 1 tablet (20 mg total) by mouth daily with supper. (Patient not taking: Reported on 07/16/2019) 14 tablet 0   No current facility-administered medications on file prior to visit.    Allergies  Allergen Reactions  . Penicillins Shortness Of Breath    Family History  Problem Relation Age of Onset  . Cancer Father        Lung  . Colon cancer Neg Hx   . Colon polyps Neg Hx   . Esophageal cancer Neg Hx   . Stomach cancer Neg Hx   . Rectal cancer Neg Hx     Social History   Socioeconomic History  . Marital status: Married    Spouse name: Not on file  . Number of children: Not on file  . Years of education: Not on file  . Highest education level: Not on file  Occupational History  . Not on file  Tobacco Use  . Smoking status: Never Smoker  . Smokeless tobacco: Never Used  Substance and Sexual Activity  . Alcohol use: No  Alcohol/week: 0.0 standard drinks  . Drug use: No  . Sexual activity: Yes    Birth control/protection: Other-see comments    Comment: husband had vasectomy  Other Topics Concern  . Not on file  Social History Narrative  . Not on file   Social Determinants of Health   Financial Resource Strain:   . Difficulty of Paying Living Expenses:   Food Insecurity:   . Worried About Charity fundraiser in the Last Year:   . Arboriculturist in the Last Year:   Transportation Needs:   . Film/video editor (Medical):   Marland Kitchen Lack of Transportation (Non-Medical):   Physical Activity: Inactive  . Days of Exercise per Week: 0 days  . Minutes of Exercise per Session: 0 min  Stress:   . Feeling of  Stress :   Social Connections:   . Frequency of Communication with Friends and Family:   . Frequency of Social Gatherings with Friends and Family:   . Attends Religious Services:   . Active Member of Clubs or Organizations:   . Attends Archivist Meetings:   Marland Kitchen Marital Status:   Intimate Partner Violence:   . Fear of Current or Ex-Partner:   . Emotionally Abused:   Marland Kitchen Physically Abused:   . Sexually Abused:    Review of Systems  Constitutional: Negative for fever and weight loss.  HENT: Negative for ear discharge, ear pain, hearing loss and tinnitus.   Eyes: Negative for blurred vision, double vision, photophobia and pain.  Respiratory: Negative for cough and shortness of breath.   Cardiovascular: Negative for chest pain and palpitations.  Gastrointestinal: Negative for abdominal pain, blood in stool, constipation, diarrhea, heartburn, melena, nausea and vomiting.  Genitourinary: Negative for dysuria, flank pain, frequency, hematuria and urgency.  Musculoskeletal: Negative for falls.  Neurological: Positive for tingling. Negative for dizziness, loss of consciousness and headaches.  Endo/Heme/Allergies: Negative for environmental allergies.  Psychiatric/Behavioral: Negative for depression, hallucinations, substance abuse and suicidal ideas. The patient is not nervous/anxious and does not have insomnia.    BP 122/74   Pulse 68   Temp 98.2 F (36.8 C)   Resp 16   Ht 5' 5.8" (1.671 m)   Wt 201 lb 3.2 oz (91.3 kg)   LMP 01/15/2020 (Approximate)   SpO2 98%   BMI 32.67 kg/m   Physical Exam Vitals reviewed.  HENT:     Head: Normocephalic and atraumatic.     Right Ear: Tympanic membrane, ear canal and external ear normal.     Left Ear: Tympanic membrane, ear canal and external ear normal.     Nose: Nose normal. No mucosal edema.     Mouth/Throat:     Pharynx: Uvula midline. No oropharyngeal exudate or posterior oropharyngeal erythema.  Eyes:     Conjunctiva/sclera:  Conjunctivae normal.     Pupils: Pupils are equal, round, and reactive to light.  Neck:     Thyroid: No thyromegaly.  Cardiovascular:     Rate and Rhythm: Normal rate and regular rhythm.     Heart sounds: Normal heart sounds.  Pulmonary:     Effort: Pulmonary effort is normal. No respiratory distress.     Breath sounds: Normal breath sounds. No wheezing or rales.  Abdominal:     General: Bowel sounds are normal. There is no distension.     Palpations: Abdomen is soft. There is no mass.     Tenderness: There is no abdominal tenderness. There is no guarding or  rebound.  Musculoskeletal:     Cervical back: Neck supple.  Lymphadenopathy:     Head:     Right side of head: No submental, submandibular, tonsillar, preauricular, posterior auricular or occipital adenopathy.     Left side of head: No submental, submandibular, tonsillar, preauricular, posterior auricular or occipital adenopathy.     Cervical: Cervical adenopathy present.     Right cervical: No superficial or posterior cervical adenopathy.    Left cervical: Posterior cervical adenopathy present. No superficial cervical adenopathy.  Skin:    General: Skin is warm and dry.     Findings: No rash.  Neurological:     Mental Status: She is alert and oriented to person, place, and time.     Cranial Nerves: No cranial nerve deficit.     Assessment/Plan: 1. Visit for preventive health examination Depression screen negative. Health Maintenance reviewed --immunizations and colorectal cancer screening up-to-date.  Patient is due for mammogram which has been ordered.. Preventive schedule discussed and handout given in AVS. Will obtain fasting labs today.  - CBC with Differential/Platelet - Comprehensive metabolic panel - Hemoglobin A1c - Lipid panel - TSH  2. Encounter for screening mammogram for malignant neoplasm of breast Agrees to repeat screening.  Order placed. - MM DIGITAL SCREENING BILATERAL; Future  3. Lymph node  enlargement Positive history of left-sided posterior cervical lymph node.  Last evaluation in 2011 via CT-guided noting dermal lymph node.  Patient endorses that this has gotten bigger.  She just wants reassurance that there is nothing more concerning present.  No pathologic findings of lymph node on examination.  We will repeat CT soft tissue to further assessed and hopefully give further reassurance. - CT SOFT TISSUE NECK W CONTRAST; Future  4. Trigeminal neuralgia Referral to neurology placed per patient request. - Ambulatory referral to Neurology  This visit occurred during the SARS-CoV-2 public health emergency.  Safety protocols were in place, including screening questions prior to the visit, additional usage of staff PPE, and extensive cleaning of exam room while observing appropriate contact time as indicated for disinfecting solutions.     Leeanne Rio, PA-C

## 2020-01-22 NOTE — Patient Instructions (Signed)
Please go to the lab for blood work.   Our office will call you with your results unless you have chosen to receive results via MyChart.  If your blood work is normal we will follow-up each year for physicals and as scheduled for chronic medical problems.  If anything is abnormal we will treat accordingly and get you in for a follow-up.  You will be contacted regarding your mammogram and an appointment with Neurology.   You will also be contacted to schedule the CT of your neck.  I will call with results as soon as I have them.  Hang in there!

## 2020-01-23 ENCOUNTER — Other Ambulatory Visit: Payer: Self-pay

## 2020-01-23 MED ORDER — EUTHYROX 88 MCG PO TABS: ORAL_TABLET | ORAL | 0 refills | Status: DC

## 2020-02-05 ENCOUNTER — Ambulatory Visit
Admission: RE | Admit: 2020-02-05 | Discharge: 2020-02-05 | Disposition: A | Discharge status: Home / Self Care | Payer: 59 | Source: Ambulatory Visit | Attending: Physician Assistant | Admitting: Physician Assistant

## 2020-02-05 DIAGNOSIS — R599 Enlarged lymph nodes, unspecified: Secondary | ICD-10-CM

## 2020-02-05 MED ORDER — IOPAMIDOL (ISOVUE-300) INJECTION 61%
75.0000 mL | Freq: Once | INTRAVENOUS | Status: AC | PRN
  Administered 2020-02-05: 75 mL via INTRAVENOUS

## 2020-03-15 ENCOUNTER — Ambulatory Visit: Payer: 59 | Attending: Internal Medicine

## 2020-03-15 DIAGNOSIS — Z23 Encounter for immunization: Secondary | ICD-10-CM

## 2020-03-15 NOTE — Progress Notes (Signed)
   Covid-19 Vaccination Clinic  Name:  SOMA BACHAND    MRN: [id number] DOB: [birth year]  03/15/2020  Ms. Aquilino was observed post Covid-19 immunization for 15 minutes without incident. She was provided with Vaccine Information Sheet and instruction to access the V-Safe system.   Ms. Bachmeier was instructed to call 911 with any severe reactions post vaccine: Marland Kitchen Difficulty breathing  . Swelling of face and throat  . A fast heartbeat  . A bad rash all over body  . Dizziness and weakness   Immunizations Administered    Name Date Dose VIS Date Route   Pfizer COVID-19 Vaccine 03/15/2020  1:27 PM 0.3 mL 11/19/2018 Intramuscular   Manufacturer: [employer]   Lot: [id number]   [place]: [id number]

## 2020-03-23 ENCOUNTER — Other Ambulatory Visit: Payer: Self-pay

## 2020-03-23 ENCOUNTER — Encounter (INDEPENDENT_AMBULATORY_CARE_PROVIDER_SITE_OTHER): Payer: Self-pay | Admitting: Family Medicine

## 2020-03-23 ENCOUNTER — Ambulatory Visit (INDEPENDENT_AMBULATORY_CARE_PROVIDER_SITE_OTHER): Payer: 59 | Admitting: Family Medicine

## 2020-03-23 VITALS — BP 135/79 | HR 73 | Temp 98.4°F | Ht 66.0 in | Wt 216.0 lb

## 2020-03-23 DIAGNOSIS — E559 Vitamin D deficiency, unspecified: Secondary | ICD-10-CM | POA: Diagnosis not present

## 2020-03-23 DIAGNOSIS — E669 Obesity, unspecified: Secondary | ICD-10-CM

## 2020-03-23 DIAGNOSIS — Z0289 Encounter for other administrative examinations: Secondary | ICD-10-CM

## 2020-03-23 DIAGNOSIS — R0602 Shortness of breath: Secondary | ICD-10-CM | POA: Diagnosis not present

## 2020-03-23 DIAGNOSIS — E538 Deficiency of other specified B group vitamins: Secondary | ICD-10-CM

## 2020-03-23 DIAGNOSIS — E038 Other specified hypothyroidism: Secondary | ICD-10-CM

## 2020-03-23 DIAGNOSIS — Z6834 Body mass index (BMI) 34.0-34.9, adult: Secondary | ICD-10-CM

## 2020-03-23 DIAGNOSIS — Z9189 Other specified personal risk factors, not elsewhere classified: Secondary | ICD-10-CM

## 2020-03-23 DIAGNOSIS — F3289 Other specified depressive episodes: Secondary | ICD-10-CM | POA: Diagnosis not present

## 2020-03-23 DIAGNOSIS — R5383 Other fatigue: Secondary | ICD-10-CM

## 2020-03-23 DIAGNOSIS — E7849 Other hyperlipidemia: Secondary | ICD-10-CM

## 2020-03-23 DIAGNOSIS — R739 Hyperglycemia, unspecified: Secondary | ICD-10-CM

## 2020-03-23 NOTE — Progress Notes (Signed)
Office: [phone]  /  Fax: [phone]    Date: April 05, 2020  Time Seen: 9:02am Duration: 22 minutes Provider: Glennie Isle, PsyD Type of Session: Intake for Individual Therapy  Type of Contact: Face-to-face  Informed Consent for In-Person Services During COVID-19: During today's appointment, information about the decision to initiate in-person services in light of the HENID-78 public health crisis was discussed. Maria Glenn and this provider agreed to meet in person for some or all future appointments. If there is a resurgence of the pandemic or other health concerns arise, telepsychological services may be initiated and any related concerns will be discussed and an attempt to address them will be made. Maria Glenn verbally acknowledged understanding that if necessary, this provider may determine there is a need to initiate telepsychological services for everyone's well-being. Maria Glenn expressed understanding she may request to initiate telepsychological services, and that request will be respected as long as it is feasible and clinically appropriate. Regarding telepsychological services, Maria Glenn acknowledged she is ultimately responsible for understanding her insurance benefits as it relates to reimbursement of telepsychological services. Moreover, the risks for opting for in-person services was discussed. Maria Glenn verbally acknowledged understanding that by coming to the office, she is assuming the risk of exposure to the coronavirus or other public risk. To obtain in-person services, Maria Glenn verbally agreed to taking certain precautions set forth by [facility] to keep everyone safe from exposure, sickness, and possible death. This information was shared by front desk staff either at the time of scheduling and/or during the check-in process. Maria Glenn expressed understanding that should she not adhere to these safeguards, it may result in starting/returning to a telepsychological service arrangement and/or the  exploration of other options for treatment. Maria Glenn acknowledged understanding that [facility] will follow the protocol set forth by [facility] should a patient present with a fever or other symptoms or disclose recent exposure, which will include rescheduling the appointment. Furthermore, Maria Glenn acknowledged understanding that precautions may change if additional local, state or federal orders or guidelines are published. To avoid handling of paper/writing instruments and increasing likelihood of touching, verbal consent was obtained by Maria Glenn during today's appointment prior to proceeding. Maria Glenn provided verbal consent to proceed, and acknowledged understanding that by verbally consenting to proceed, she is agreeable to all information noted above.   Informed Consent: The provider's role was explained to Maria Glenn. The provider reviewed and discussed issues of confidentiality, privacy, and limits therein (e.g., reporting obligations). In addition to verbal informed consent, written informed consent for psychological services was obtained prior to the initial appointment. Since the clinic is not a 24/7 crisis center, mental health emergency resources were shared and this  provider explained MyChart, e-mail, voicemail, and/or other messaging systems should be utilized only for non-emergency reasons. This provider also explained that information obtained during appointments will be placed in [facility] medical record and relevant information will be shared with other providers at [facility] for coordination of care. Moreover, Melea agreed information may be shared with other [facility] providers as needed for coordination of care. By signing the service agreement document, Maria Glenn provided written consent for coordination of care. Maria Glenn also verbally acknowledged understanding she is ultimately responsible for understanding her insurance benefits for services. Maria Glenn   acknowledged understanding that appointments cannot be recorded without both party consent. Maria Glenn verbally consented to proceed.  Chief Complaint/HPI: Maria Glenn was referred by Dr. Dennard Glenn due to other depression, with emotional eating. Per the note for  the initial visit with Dr. Dennard Glenn on March 23, 2020, "Maria Glenn notes significant emotional eating." The note for the initial appointment with Dr. Dennard Glenn indicated the following: "Maria Glenn's habits were reviewed today and are as follows: Her family eats meals together, she thinks her family will eat healthier with her, her desired weight loss is 51 lbs, she has been heavy most of her life, she started gaining weight (she's always had weight issues), her heaviest weight ever was 250 pounds, she has significant food cravings issues, she snacks frequently in the evenings, she frequently makes poor food choices, she frequently eats larger portions than normal and she struggles with emotional eating.."  Airiana's Food and Mood (modified PHQ-9) score on March 23, 2020 was 7.  During today's appointment, Maria Glenn was verbally administered a questionnaire assessing various behaviors related to emotional eating. Maria Glenn endorsed the following: overeat when you are celebrating, experience food cravings on a regular basis, eat certain foods when you are anxious, stressed, depressed, or your feelings are hurt, use food to help you cope with emotional situations, find food is comforting to you, overeat when you are angry or upset, overeat when you are worried about something, overeat frequently when you are bored or lonely and eat as a reward. She shared shecraves cheese puffs and chocolate. She is unsure about the onset of emotional eating. She described the current frequency of emotional eating as variable. In addition, Maria Glenn denied a history of binge eating. Maria Glenn denied a history of restricting food intake, purging and engagement in other compensatory strategies, and has  never been diagnosed with an eating disorder. She also denied a history of treatment for emotional eating. Moreover, Maria Glenn indicated stress triggers emotional eating, whereas having accountability makes emotional eating better. Furthermore, Maria Glenn denied other problems of concern.    Mental Status Examination:  Appearance: well groomed and appropriate hygiene  Behavior: appropriate to circumstances Mood: euthymic Affect: mood congruent Speech: normal in rate, volume, and tone Eye Contact: appropriate Psychomotor Activity: appropriate Gait: normal Thought Process: linear, logical, and goal directed  Thought Content/Perception: denies suicidal and homicidal ideation, plan, and intent and no hallucinations, delusions, bizarre thinking or behavior reported or observed Orientation: time, person, place, and purpose of appointment Memory/Concentration: memory, attention, language, and fund of knowledge intact  Insight/Judgment: fair  Family & Psychosocial History: Maria Glenn reported she is married and she has one son (age 28). She indicated she is currently not working. Additionally, Maria Glenn shared her highest level of education obtained is high school diploma. Currently, Maria Glenn's social support system consists of her husband. Moreover, Maria Glenn stated she resides with her husband, son, and cat.  Medical History:  Past Medical History:  Diagnosis Date  . Allergy   . Clotting disorder ([place])    factor V Leiden  . DVT (deep venous thrombosis) ([place])   . Factor V Leiden ([place])   . Hyperlipidemia   . Hypothyroidism   . Left leg DVT ([place]) 08/09/2015  . Thyroid disease   . Vitamin D deficiency    Past Surgical History:  Procedure Laterality Date  . CESAREAN SECTION     Current Outpatient Medications on File Prior to Visit  Medication Sig Dispense Refill  . aspirin 81 MG chewable tablet Chew 81 mg by mouth daily.    Arna Medici 88 MCG tablet TAKE 1 TABLET BY MOUTH ONCE DAILY BEFORE BREAKFAST 90 tablet 0    . loratadine (CLARITIN) 10 MG tablet Take 10 mg by mouth daily.  No current facility-administered medications on file prior to visit.   Mental Health History: Maria Glenn denied a history of therapeutic and psychiatric services. Chauna reported there is no history of hospitalizations for psychiatric concerns. Mei denied a family history of mental health related concerns. Sherion reported there is no history of trauma including psychological, physical  and sexual abuse, as well as neglect.   Danielly described her typical mood as "good." She described the pandemic impacted her mood, as she was unable to go to the gym and socialize. She noted plans to resume going to the gym once she received her second COVID-19 vaccine. Valincia denied current alcohol use. She denied tobacco use. She denied illicit/recreational substance use. Regarding caffeine intake, Maylene reported consuming approximately 2 liters of diet Dr. Malachi Bonds daily. Furthermore, Jacques indicated she is not experiencing the following: hallucinations and delusions, paranoia, symptoms of mania , social withdrawal, crying spells, panic attacks and decreased motivation. She also denied history of and current suicidal ideation, plan, and intent; history of and current homicidal ideation, plan, and intent; and history of and current engagement in self-harm.  The following strength was reported by Maria Glenn: resilient. The following strengths were observed by this provider: ability to express thoughts and feelings during the therapeutic session, ability to establish and benefit from a therapeutic relationship, willingness to work toward established goal(s) with the clinic and ability to engage in reciprocal conversation.  Legal History: Dru reported there is no history of legal involvement.   Structured Assessments Results: The Patient Health Questionnaire-9 (PHQ-9) is a self-report measure that assesses symptoms and severity of depression over the course of the  last two weeks. Temitope obtained a score of 0. [0= Not at all; 1= Several days; 2= More than half the days; 3= Nearly every day] Little interest or pleasure in doing things 0  Feeling down, depressed, or hopeless 0  Trouble falling or staying asleep, or sleeping too much 0  Feeling tired or having little energy 0  Poor appetite or overeating 0  Feeling bad about yourself --- or that you are a failure or have let yourself or your family down 0  Trouble concentrating on things, such as reading the newspaper or watching television 0  Moving or speaking so slowly that other people could have noticed? Or the opposite --- being so fidgety or restless that you have been moving around a lot more than usual 0  Thoughts that you would be better off dead or hurting yourself in some way 0  PHQ-9 Score 0    The Generalized Anxiety Disorder-7 (GAD-7) is a brief self-report measure that assesses symptoms of anxiety over the course of the last two weeks. Zayli obtained a score of 0. [0= Not at all; 1= Several days; 2= Over half the days; 3= Nearly every day] Feeling nervous, anxious, on edge 0  Not being able to stop or control worrying 0  Worrying too much about different things 0  Trouble relaxing 0  Being so restless that it's hard to sit still 0  Becoming easily annoyed or irritable 0  Feeling afraid as if something awful might happen 0  GAD-7 Score 0   Interventions:  Conducted a chart review Focused on rapport building Verbally administered PHQ-9 and GAD-7 for symptom monitoring Verbally administered Food & Mood questionnaire to assess various behaviors related to emotional eating Provided emphatic reflections and validation Psychoeducation provided regarding physical versus emotional hunger  Provisional DSM-5 Diagnosis: 307.59 (F50.8) Other Specified Feeding or Eating Disorder,  Emotional Eating Behaviors  Plan: Sarabelle declined future appointments with this provider as she feels things are going  well with her current structured meal plan and that has resulted in a reduction in emotional eating. She acknowledged understanding that she may request a follow-up appointment with this provider in the future as long as she is still established with the clinic. Hanifah was provided a handout to utilize to increase awareness of hunger patterns and subsequent eating. No further follow-up planned by this provider.

## 2020-03-24 LAB — COMPREHENSIVE METABOLIC PANEL
ALT: 14 IU/L (ref 0–32)
AST: 14 IU/L (ref 0–40)
Albumin/Globulin Ratio: 1.6 (ref 1.2–2.2)
Albumin: 4.3 g/dL (ref 3.8–4.9)
Alkaline Phosphatase: 68 IU/L (ref 48–121)
BUN/Creatinine Ratio: 18 (ref 9–23)
BUN: 13 mg/dL (ref 6–24)
Bilirubin Total: 0.3 mg/dL (ref 0.0–1.2)
CO2: 26 mmol/L (ref 20–29)
Calcium: 9.2 mg/dL (ref 8.7–10.2)
Chloride: 102 mmol/L (ref 96–106)
Creatinine, Ser: 0.71 mg/dL (ref 0.57–1.00)
GFR calc Af Amer: 113 mL/min/{1.73_m2} (ref >59)
GFR calc non Af Amer: 98 mL/min/{1.73_m2} (ref >59)
Globulin, Total: 2.7 g/dL (ref 1.5–4.5)
Glucose: 100 mg/dL — ABNORMAL HIGH (ref 65–99)
Potassium: 4.4 mmol/L (ref 3.5–5.2)
Sodium: 139 mmol/L (ref 134–144)
Total Protein: 7 g/dL (ref 6.0–8.5)

## 2020-03-24 LAB — CBC WITH DIFFERENTIAL/PLATELET
Basophils Absolute: 0.1 10*3/uL (ref 0.0–0.2)
Basos: 1 %
EOS (ABSOLUTE): 0.3 10*3/uL (ref 0.0–0.4)
Eos: 4 %
Hematocrit: 39.7 % (ref 34.0–46.6)
Hemoglobin: 12.9 g/dL (ref 11.1–15.9)
Immature Grans (Abs): 0 10*3/uL (ref 0.0–0.1)
Immature Granulocytes: 0 %
Lymphocytes Absolute: 2.3 10*3/uL (ref 0.7–3.1)
Lymphs: 35 %
MCH: 29.3 pg (ref 26.6–33.0)
MCHC: 32.5 g/dL (ref 31.5–35.7)
MCV: 90 fL (ref 79–97)
Monocytes Absolute: 0.4 10*3/uL (ref 0.1–0.9)
Monocytes: 7 %
Neutrophils Absolute: 3.4 10*3/uL (ref 1.4–7.0)
Neutrophils: 53 %
Platelets: 227 10*3/uL (ref 150–450)
RBC: 4.4 x10E6/uL (ref 3.77–5.28)
RDW: 13.4 % (ref 11.7–15.4)
WBC: 6.4 10*3/uL (ref 3.4–10.8)

## 2020-03-24 LAB — LIPID PANEL WITH LDL/HDL RATIO
Cholesterol, Total: 255 mg/dL — ABNORMAL HIGH (ref 100–199)
HDL: 79 mg/dL (ref >39)
LDL Chol Calc (NIH): 162 mg/dL — ABNORMAL HIGH (ref 0–99)
LDL/HDL Ratio: 2.1 ratio (ref 0.0–3.2)
Triglycerides: 81 mg/dL (ref 0–149)
VLDL Cholesterol Cal: 14 mg/dL (ref 5–40)

## 2020-03-24 LAB — TSH: TSH: 1.59 u[IU]/mL (ref 0.450–4.500)

## 2020-03-24 LAB — FOLATE: Folate: 8 ng/mL (ref >3.0)

## 2020-03-24 LAB — INSULIN, RANDOM: INSULIN: 8.7 u[IU]/mL (ref 2.6–24.9)

## 2020-03-24 LAB — VITAMIN B12: Vitamin B-12: 523 pg/mL (ref 232–1245)

## 2020-03-24 LAB — T4, FREE: Free T4: 1.29 ng/dL (ref 0.82–1.77)

## 2020-03-24 LAB — HEMOGLOBIN A1C
Est. average glucose Bld gHb Est-mCnc: 117 mg/dL
Hgb A1c MFr Bld: 5.7 % — ABNORMAL HIGH (ref 4.8–5.6)

## 2020-03-24 LAB — T3: T3, Total: 98 ng/dL (ref 71–180)

## 2020-03-24 LAB — VITAMIN D 25 HYDROXY (VIT D DEFICIENCY, FRACTURES): Vit D, 25-Hydroxy: 27 ng/mL — ABNORMAL LOW (ref 30.0–100.0)

## 2020-03-24 NOTE — Progress Notes (Signed)
Chief Complaint:   OBESITY Maria Glenn (MR# [id number]) is a 54 y.o. female who presents for evaluation and treatment of obesity and related comorbidities. Current BMI is Body mass index is 34.86 kg/m. [name] has been struggling with her weight for many years and has been unsuccessful in either losing weight, maintaining weight loss, or reaching her healthy weight goal.  [name] is currently in the action stage of change and ready to dedicate time achieving and maintaining a healthier weight. [name] is interested in becoming our patient and working on intensive lifestyle modifications including (but not limited to) diet and exercise for weight loss.  [name] habits were reviewed today and are as follows: Her family eats meals together, she thinks her family will eat healthier with her, her desired weight loss is 51 lbs, she has been heavy most of her life, she started gaining weight (she's always had weight issues), her heaviest weight ever was 250 pounds, she has significant food cravings issues, she snacks frequently in the evenings, she frequently makes poor food choices, she frequently eats larger portions than normal and she struggles with emotional eating.  Depression Screen Maria Glenn's Food and Mood (modified PHQ-9) score was 7.  Depression screen [facility] 2/9 03/23/2020  Decreased Interest 2  Down, Depressed, Hopeless 1  PHQ - 2 Score 3  Altered sleeping 0  Tired, decreased energy 2  Change in appetite 1  Feeling bad or failure about yourself  1  Trouble concentrating 0  Moving slowly or fidgety/restless 0  Suicidal thoughts 0  PHQ-9 Score 7  Difficult doing work/chores Not difficult at all   Subjective:   1. Other fatigue [name] admits to daytime somnolence and denies waking up still tired. Patent has a history of symptoms of daytime fatigue. Maria Glenn generally gets 8 or 9 hours of sleep per night, and states that she has generally restful sleep. Snoring is present. Apneic episodes  are not present. Epworth Sleepiness Score is 3.  2. Shortness of breath on exertion Juliann Pulse notes increasing shortness of breath with exercising and seems to be worsening over time with weight gain. She notes getting out of breath sooner with activity than she used to. This has not gotten worse recently. Michaelah denies shortness of breath at rest or orthopnea.  3. Vitamin D deficiency Maria Glenn has a history of Vit D deficiency, and she notes fatigue.  4. Vitamin B 12 deficiency Maria Glenn has a history of B12, and she has no recent labs. She notes fatigue.  5. Other hyperlipidemia Maria Glenn's LDL is significantly elevated. She is not on statin, and she would like to improve with diet and weight loss.  6. Hyperglycemia Maria Glenn has had a mildly elevated A1c last year. She notes polyphagia.  7. Other specified hypothyroidism Maria Glenn is on levothyroxine by her primary care physician. She notes fatigue. Last TSH was within normal limits.  8. Other depression with emotional eating Maria Glenn notes significant emotional eating.  9. At risk for heart disease Maria Glenn is at a higher than average risk for cardiovascular disease due to obesity.   Assessment/Plan:   1. Other fatigue Rise does feel that her weight is causing her energy to be lower than it should be. Fatigue may be related to obesity, depression or many other causes. Labs will be ordered, and in the meanwhile, [name] will focus on self care including making healthy food choices, increasing physical activity and focusing on stress reduction.  - EKG 12-Lead - Comprehensive metabolic panel - CBC  with Differential/Platelet - Vitamin B12 - Folate - T3 - T4, free - TSH  2. Shortness of breath on exertion [name] does feel that she gets out of breath more easily that she used to when she exercises. Maria Glenn's shortness of breath appears to be obesity related and exercise induced. She has agreed to work on weight loss and gradually increase exercise to treat her  exercise induced shortness of breath. Will continue to monitor closely.   3. Vitamin D deficiency Low Vitamin D level contributes to fatigue and are associated with obesity, breast, and colon cancer. We will check labs today. Maria Glenn will follow-up for routine testing of Vitamin D, at least 2-3 times per year to avoid over-replacement.  - VITAMIN D 25 Hydroxy (Vit-D Deficiency, Fractures)  4. Vitamin B 12 deficiency The diagnosis was reviewed with the patient. We will continue to monitor. We will check labs and Maria Glenn will follow as directed. Orders and follow up as documented in patient record.  - Vitamin B12 - Folate  5. Other hyperlipidemia Cardiovascular risk and specific lipid/LDL goals reviewed. We discussed several lifestyle modifications today. [name] will start her eating plan, and will continue to work on exercise and weight loss efforts. We will check labs today. Orders and follow up as documented in patient record.   - Comprehensive metabolic panel - CBC with Differential/Platelet - Hemoglobin A1c - Insulin, random - Lipid Panel With LDL/HDL Ratio - VITAMIN D 25 Hydroxy (Vit-D Deficiency, Fractures) - Vitamin B12 - Folate  6. Hyperglycemia Fasting labs will be obtained today, and results with be discussed with Juliann Pulse in 2 weeks at her follow up visit. In the meanwhile Maria Glenn will start her eating plan and will work on weight loss efforts.  - Comprehensive metabolic panel - CBC with Differential/Platelet - Hemoglobin A1c - Insulin, random - Lipid Panel With LDL/HDL Ratio - VITAMIN D 25 Hydroxy (Vit-D Deficiency, Fractures) - Vitamin B12 - Folate  7. Other specified hypothyroidism Patient with long-standing hypothyroidism, on levothyroxine therapy. She appears euthyroid. We will check labs today. Maria Glenn will continue her medications as is. Orders and follow up as documented in patient record.  - T3 - T4, free - TSH  8. Other depression with emotional eating Behavior  modification techniques were discussed today to help Maria Glenn deal with her emotional/non-hunger eating behaviors. We will refer to Dr. Mallie Mussel, our Bariatric Psychologist for evaluation. Orders and follow up as documented in patient record.   9. At risk for heart disease Ysabella was given approximately 30 minutes of coronary artery disease prevention counseling today. She is 54 y.o. female and has risk factors for heart disease including obesity. We discussed intensive lifestyle modifications today with an emphasis on specific weight loss instructions and strategies.   Repetitive spaced learning was employed today to elicit superior memory formation and behavioral change.   10. Class 1 obesity with serious comorbidity and body mass index (BMI) of 34.0 to 34.9 in adult, unspecified obesity type Aira is currently in the action stage of change and her goal is to continue with weight loss efforts. I recommend Bayli begin the structured treatment plan as follows:  She has agreed to the Category 2 Plan + 100 calories.  Exercise goals: No exercise has been prescribed for now, while we concentrate on nutritional changes.   Behavioral modification strategies: increasing lean protein intake and meal planning and cooking strategies.  She was informed of the importance of frequent follow-up visits to maximize her success with intensive lifestyle modifications for  her multiple health conditions. She was informed we would discuss her lab results at her next visit unless there is a critical issue that needs to be addressed sooner. Dorris agreed to keep her next visit at the agreed upon time to discuss these results.  Objective:   Blood pressure 135/79, pulse 73, temperature 98.4 F (36.9 C), temperature source Oral, height 5\' 6"  (1.676 m), weight 216 lb (98 kg), last menstrual period 03/14/2020, SpO2 96 %. Body mass index is 34.86 kg/m.  EKG: Normal sinus rhythm, rate 75 BPM.  Indirect Calorimeter completed  today shows a VO2 of 326 and a REE of 2267.  Her calculated basal metabolic rate is 4315 thus her basal metabolic rate is better than expected.  General: Cooperative, alert, well developed, in no acute distress. HEENT: Conjunctivae and lids unremarkable. Cardiovascular: Regular rhythm.  Lungs: Normal work of breathing. Neurologic: No focal deficits.   Lab Results  Component Value Date   CREATININE 0.71 03/23/2020   BUN 13 03/23/2020   NA 139 03/23/2020   K 4.4 03/23/2020   CL 102 03/23/2020   CO2 26 03/23/2020   Lab Results  Component Value Date   ALT 14 03/23/2020   AST 14 03/23/2020   ALKPHOS 68 03/23/2020   BILITOT 0.3 03/23/2020   Lab Results  Component Value Date   HGBA1C 5.7 (H) 03/23/2020   HGBA1C 5.5 01/22/2020   HGBA1C 5.7 07/16/2019   HGBA1C 5.9 12/20/2017   HGBA1C 6.2 11/30/2016   Lab Results  Component Value Date   INSULIN 8.7 03/23/2020   Lab Results  Component Value Date   TSH 1.590 03/23/2020   Lab Results  Component Value Date   CHOL 255 (H) 03/23/2020   HDL 79 03/23/2020   LDLCALC 162 (H) 03/23/2020   TRIG 81 03/23/2020   CHOLHDL 4 01/22/2020   Lab Results  Component Value Date   WBC 6.4 03/23/2020   HGB 12.9 03/23/2020   HCT 39.7 03/23/2020   MCV 90 03/23/2020   PLT 227 03/23/2020   No results found for: IRON, TIBC, FERRITIN  Attestation Statements:   Reviewed by clinician on day of visit: allergies, medications, problem list, medical history, surgical history, family history, social history, and previous encounter notes.   I, Trixie Dredge, am acting as transcriptionist for Dennard Nip, MD.  I have reviewed the above documentation for accuracy and completeness, and I agree with the above. - Dennard Nip, MD

## 2020-04-05 ENCOUNTER — Other Ambulatory Visit: Payer: Self-pay

## 2020-04-05 ENCOUNTER — Ambulatory Visit (INDEPENDENT_AMBULATORY_CARE_PROVIDER_SITE_OTHER): Payer: 59 | Admitting: Psychology

## 2020-04-05 DIAGNOSIS — F5089 Other specified eating disorder: Secondary | ICD-10-CM | POA: Diagnosis not present

## 2020-04-06 ENCOUNTER — Encounter (INDEPENDENT_AMBULATORY_CARE_PROVIDER_SITE_OTHER): Payer: Self-pay | Admitting: Family Medicine

## 2020-04-06 ENCOUNTER — Ambulatory Visit (INDEPENDENT_AMBULATORY_CARE_PROVIDER_SITE_OTHER): Payer: 59 | Admitting: Family Medicine

## 2020-04-06 VITALS — BP 111/76 | HR 70 | Temp 98.5°F | Ht 66.0 in | Wt 209.0 lb

## 2020-04-06 DIAGNOSIS — Z6833 Body mass index (BMI) 33.0-33.9, adult: Secondary | ICD-10-CM

## 2020-04-06 DIAGNOSIS — R7303 Prediabetes: Secondary | ICD-10-CM | POA: Diagnosis not present

## 2020-04-06 DIAGNOSIS — E559 Vitamin D deficiency, unspecified: Secondary | ICD-10-CM

## 2020-04-06 DIAGNOSIS — E7800 Pure hypercholesterolemia, unspecified: Secondary | ICD-10-CM

## 2020-04-06 DIAGNOSIS — E669 Obesity, unspecified: Secondary | ICD-10-CM

## 2020-04-06 DIAGNOSIS — Z9189 Other specified personal risk factors, not elsewhere classified: Secondary | ICD-10-CM

## 2020-04-06 MED ORDER — VITAMIN D (ERGOCALCIFEROL) 1.25 MG (50000 UNIT) PO CAPS: 50000.0000 [IU] | ORAL_CAPSULE | ORAL | 0 refills | Status: DC

## 2020-04-06 NOTE — Progress Notes (Signed)
Chief Complaint:   OBESITY Maria Glenn is here to discuss her progress with her obesity treatment plan along with follow-up of her obesity related diagnoses. Maria Glenn is on the Category 2 Plan +100 calories and states she is following her eating plan approximately 85-90% of the time. Maria Glenn states she is exercising for 0 minutes 0 times per week.  Today's visit was #: 2 Starting weight: 216 lbs Starting date: 03/23/2020 Today's weight: 209 lbs Today's date: 04/06/2020 Total lbs lost to date: 7 lbs Total lbs lost since last in-office visit: 7 lbs  Interim History: Maria Glenn's blood pressure is much improved today.  She will be getting her second COVID vaccine this week!  She says she is happy with the plan.  She is ready to start exercising.  Subjective:   1. Vitamin D deficiency Maria Glenn's Vitamin D level was 27.0 on 03/23/2020. She is currently taking no vitamin D supplement. She denies nausea, vomiting or muscle weakness.  2. Elevated LDL cholesterol level Maria Glenn's LDL was elevated at 162 at last blood draw on 03/23/2020.  Normal ratio.  ASCVD 1%.  Lab Results  Component Value Date   LDLCALC 162 (H) 03/23/2020   3. Prediabetes Maria Glenn has a diagnosis of prediabetes based on her elevated HgA1c and was informed this puts her at greater risk of developing diabetes. She continues to work on diet and exercise to decrease her risk of diabetes. She denies nausea or hypoglycemia.  Lab Results  Component Value Date   HGBA1C 5.7 (H) 03/23/2020   Lab Results  Component Value Date   INSULIN 8.7 03/23/2020   Assessment/Plan:   1. Vitamin D deficiency Low Vitamin D level contributes to fatigue and are associated with obesity, breast, and colon cancer. She agrees to start to take prescription Vitamin D @50 ,000 IU every week and will follow-up for routine testing of Vitamin D, at least 2-3 times per year to avoid over-replacement.  Orders - Vitamin D, Ergocalciferol, (DRISDOL) 1.25 MG (50000 UNIT) CAPS  capsule; Take 1 capsule (50,000 Units total) by mouth every 7 (seven) days.  Dispense: 4 capsule; Refill: 0  2. Elevated LDL cholesterol level Will continue to monitor.   3. Prediabetes Maria Glenn will continue to work on weight loss, exercise, and decreasing simple carbohydrates to help decrease the risk of diabetes.   4. At risk for heart disease Maria Glenn was given approximately 15 minutes of coronary artery disease prevention counseling today. She is 54 y.o. female and has risk factors for heart disease including obesity. We discussed intensive lifestyle modifications today with an emphasis on specific weight loss instructions and strategies.   Repetitive spaced learning was employed today to elicit superior memory formation and behavioral change.  5. Class 1 obesity with serious comorbidity and body mass index (BMI) of 33.0 to 33.9 in adult, unspecified obesity type Maria Glenn is currently in the action stage of change. As such, her goal is to continue with weight loss efforts. She has agreed to the Category 2 Plan +100 calories.   Exercise goals: She is going back to the gym  Behavioral modification strategies: increasing lean protein intake.  Maria Glenn has agreed to follow-up with our clinic in 2-3 weeks. She was informed of the importance of frequent follow-up visits to maximize her success with intensive lifestyle modifications for her multiple health conditions.   Objective:   Blood pressure 111/76, pulse 70, temperature 98.5 F (36.9 C), temperature source Oral, height 5\' 6"  (1.676 m), weight 209 lb (94.8 kg), last menstrual  period 03/14/2020, SpO2 98 %. Body mass index is 33.73 kg/m.  General: Cooperative, alert, well developed, in no acute distress. HEENT: Conjunctivae and lids unremarkable. Cardiovascular: Regular rhythm.  Lungs: Normal work of breathing. Neurologic: No focal deficits.   Lab Results  Component Value Date   CREATININE 0.71 03/23/2020   BUN 13 03/23/2020   NA 139  03/23/2020   K 4.4 03/23/2020   CL 102 03/23/2020   CO2 26 03/23/2020   Lab Results  Component Value Date   ALT 14 03/23/2020   AST 14 03/23/2020   ALKPHOS 68 03/23/2020   BILITOT 0.3 03/23/2020   Lab Results  Component Value Date   HGBA1C 5.7 (H) 03/23/2020   HGBA1C 5.5 01/22/2020   HGBA1C 5.7 07/16/2019   HGBA1C 5.9 12/20/2017   HGBA1C 6.2 11/30/2016   Lab Results  Component Value Date   INSULIN 8.7 03/23/2020   Lab Results  Component Value Date   TSH 1.590 03/23/2020   Lab Results  Component Value Date   CHOL 255 (H) 03/23/2020   HDL 79 03/23/2020   LDLCALC 162 (H) 03/23/2020   TRIG 81 03/23/2020   CHOLHDL 4 01/22/2020   Lab Results  Component Value Date   WBC 6.4 03/23/2020   HGB 12.9 03/23/2020   HCT 39.7 03/23/2020   MCV 90 03/23/2020   PLT 227 03/23/2020   Attestation Statements:   Reviewed by clinician on day of visit: allergies, medications, problem list, medical history, surgical history, family history, social history, and previous encounter notes.  I, Water quality scientist, CMA, am acting as transcriptionist for Briscoe Deutscher, DO  I have reviewed the above documentation for accuracy and completeness, and I agree with the above. Briscoe Deutscher, DO

## 2020-04-08 ENCOUNTER — Ambulatory Visit: Payer: 59

## 2020-04-08 NOTE — Progress Notes (Signed)
NEUROLOGY CONSULTATION NOTE  PAULENE TAYAG MRN: [id number] DOB: 06/23/1966  Referring provider: Brunetta Jeans, PA-C Primary care provider: Brunetta Jeans, PA-C  Reason for consult:  Left-sided trigeminal neuralgia  HISTORY OF PRESENT ILLNESS: Maria Glenn is a 54 year old right-handed female with hypothyroidism and Factor V Leiden mutation who presents for left-sided trigeminal neuralgia.  History supplemented by ENT and referring provider's note.  She has had left sided facial pain or discomfort since her early 60s.  It starts in her teeth and then a burning and stinging starts around her eye and down the left nasolabial fold to the left side of her tongue and roof of her mouth.  It is persistent for 30 to 40 minutes, not paroxysmal.  It occurs daily throughout the day.  Triggered after a hot shower, bending down, cold air and sometimes brushing and flossing her teeth.  She may treat it with Tylenol and ibuprofen.  She saw an allergist who said it wasn't allergies.  She was evaluated by both ENT and her dentist in 2016 who believed she had trigeminal neuralgia.  CT maxillofacial performed at that time was showed mild right sinusitis but nothing to explain her left sided facial pain.  She also notes associated pain down the left side of her neck.  She was noted to have a nonspecific 4 mm lymph node on the back of her neck, stable compared to 2011, but CT showed no enlarged or abnormal cervical lymph nodes.  03/23/2020 LABS:  Na 139, Vit D 27, HGB A1c 5.7, B12 523, folate 8, TSH 1.590  PAST MEDICAL HISTORY: Past Medical History:  Diagnosis Date  . Allergy   . Clotting disorder ([place])    factor V Leiden  . DVT (deep venous thrombosis) ([place])   . Factor V Leiden ([place])   . Hyperlipidemia   . Hypothyroidism   . Left leg DVT ([place]) 08/09/2015  . Thyroid disease   . Vitamin D deficiency     PAST SURGICAL HISTORY: Past Surgical History:  Procedure Laterality Date  . CESAREAN  SECTION      MEDICATIONS: Current Outpatient Medications on File Prior to Visit  Medication Sig Dispense Refill  . aspirin 81 MG chewable tablet Chew 81 mg by mouth daily.    Arna Medici 88 MCG tablet TAKE 1 TABLET BY MOUTH ONCE DAILY BEFORE BREAKFAST 90 tablet 0  . loratadine (CLARITIN) 10 MG tablet Take 10 mg by mouth daily.    . Vitamin D, Ergocalciferol, (DRISDOL) 1.25 MG (50000 UNIT) CAPS capsule Take 1 capsule (50,000 Units total) by mouth every 7 (seven) days. 4 capsule 0   No current facility-administered medications on file prior to visit.    ALLERGIES: Allergies  Allergen Reactions  . Penicillins Shortness Of Breath    FAMILY HISTORY: Family History  Problem Relation Age of Onset  . Hypertension Mother   . Hyperlipidemia Mother   . Cancer Father        Lung  . Colon cancer Neg Hx   . Colon polyps Neg Hx   . Esophageal cancer Neg Hx   . Stomach cancer Neg Hx   . Rectal cancer Neg Hx     SOCIAL HISTORY: Social History   Socioeconomic History  . Marital status: Married    Spouse name: Michelina Mexicano  . Number of children: 1  . Years of education: Not on file  . Highest education level: Not on file  Occupational History  . Not on  file  Tobacco Use  . Smoking status: Never Smoker  . Smokeless tobacco: Never Used  Vaping Use  . Vaping Use: Never used  Substance and Sexual Activity  . Alcohol use: No    Alcohol/week: 0.0 standard drinks  . Drug use: No  . Sexual activity: Yes    Birth control/protection: Other-see comments    Comment: husband had vasectomy  Other Topics Concern  . Not on file  Social History Narrative  . Not on file   Social Determinants of Health   Financial Resource Strain:   . Difficulty of Paying Living Expenses:   Food Insecurity:   . Worried About Charity fundraiser in the Last Year:   . Arboriculturist in the Last Year:   Transportation Needs:   . Film/video editor (Medical):   Marland Kitchen Lack of Transportation  (Non-Medical):   Physical Activity: Inactive  . Days of Exercise per Week: 0 days  . Minutes of Exercise per Session: 0 min  Stress:   . Feeling of Stress :   Social Connections:   . Frequency of Communication with Friends and Family:   . Frequency of Social Gatherings with Friends and Family:   . Attends Religious Services:   . Active Member of Clubs or Organizations:   . Attends Archivist Meetings:   Marland Kitchen Marital Status:   Intimate Partner Violence:   . Fear of Current or Ex-Partner:   . Emotionally Abused:   Marland Kitchen Physically Abused:   . Sexually Abused:      PHYSICAL EXAM: Blood pressure 131/77, pulse 71, resp. rate 20, height 5\' 6"  (1.676 m), weight 219 lb (99.3 kg), last menstrual period 03/14/2020, SpO2 97 %. General: No acute distress.  Patient appears well-groomed.   Head:  Normocephalic/atraumatic Eyes:  fundi examined but not visualized Neck: supple, no paraspinal tenderness, full range of motion Back: No paraspinal tenderness Heart: regular rate and rhythm Lungs: Clear to auscultation bilaterally. Vascular: No carotid bruits. Neurological Exam: Mental status: alert and oriented to person, place, and time, recent and remote memory intact, fund of knowledge intact, attention and concentration intact, speech fluent and not dysarthric, language intact. Cranial nerves: CN I: not tested CN II: pupils equal, round and reactive to light, visual fields intact CN III, IV, VI:  full range of motion, no nystagmus, no ptosis CN V: facial sensation intact CN VII: upper and lower face symmetric CN VIII: hearing intact CN IX, X: gag intact, uvula midline CN XI: sternocleidomastoid and trapezius muscles intact CN XII: tongue midline Bulk & Tone: normal, no fasciculations. Motor:  5/5 throughout  Sensation: temperature and vibration sensation intact. Deep Tendon Reflexes:  2+ throughout, toes downgoing.   Finger to nose testing:  Without dysmetria.   Heel to shin:   Without dysmetria.   Gait:  Normal station and stride.  Romberg negative.  IMPRESSION: Left-sided trigeminal neuralgia  PLAN: 1.  I have provided her with a prescription for oxcarbazepine:  150mg  twice daily for one week, then 300mg  twice daily.  She will contact me if she agrees to start it.  Advised to monitor for dizziness or ataxia and told her we would have to monitor Na. 2.  Otherwise, she will make a follow up appointment in 6 months.  Thank you for allowing me to take part in the care of this patient.  Metta Clines, DO  CC: Brunetta Jeans, PA-C

## 2020-04-09 ENCOUNTER — Encounter: Payer: Self-pay | Admitting: Neurology

## 2020-04-09 ENCOUNTER — Other Ambulatory Visit: Payer: Self-pay

## 2020-04-09 ENCOUNTER — Ambulatory Visit: Payer: 59 | Admitting: Neurology

## 2020-04-09 VITALS — BP 131/77 | HR 71 | Resp 20 | Ht 66.0 in | Wt 219.0 lb

## 2020-04-09 DIAGNOSIS — G500 Trigeminal neuralgia: Secondary | ICD-10-CM | POA: Diagnosis not present

## 2020-04-09 MED ORDER — OXCARBAZEPINE 300 MG PO TABS: ORAL_TABLET | ORAL | 0 refills | Status: DC

## 2020-04-09 NOTE — Patient Instructions (Signed)
1.  If you are agreeable, start oxcarbazepine as directed.  Let me know if you have started the medication.  We will have to monitor your sodium levels periodically.   2.  Follow up in 6 months.   Trigeminal Neuralgia  Trigeminal neuralgia is a nerve disorder that causes severe pain on one side of the face. The pain may last from a few seconds to several minutes. The pain is usually only on one side of the face. Symptoms may occur for days, weeks, or months and then go away for months or years. The pain may return and be worse than before. What are the causes? This condition is caused by damage or pressure to a nerve in the head that is called the trigeminal nerve. An attack can be triggered by:  Talking.  Chewing.  Putting on makeup.  Washing your face.  Shaving your face.  Brushing your teeth.  Touching your face. What increases the risk? You are more likely to develop this condition if you:  Are 70 years of age or older.  Are female. What are the signs or symptoms? The main symptom of this condition is severe pain in the:  Jaw.  Lips.  Eyes.  Nose.  Scalp.  Forehead.  Face. The pain may be:  Intense.  Stabbing.  Electric.  Shock-like. How is this diagnosed? This condition is diagnosed with a physical exam. A CT scan or an MRI may be done to rule out other conditions that can cause facial pain. How is this treated? This condition may be treated with:  Avoiding the things that trigger your symptoms.  Taking prescription medicines (anticonvulsants).  Having surgery. This may be done in severe cases if other medical treatment does not provide relief.  Having procedures such as ablation, thermal, or radiation therapy. It may take up to one month for treatment to start relieving the pain. Follow these instructions at home: Managing pain  Learn as much as you can about how to manage your pain. Ask your health care provider if a pain specialist would  be helpful.  Consider talking with a mental health care provider (psychologist) about how to cope with the pain.  Consider joining a pain support group. General instructions  Take over-the-counter and prescription medicines only as told by your health care provider.  Avoid the things that trigger your symptoms. It may help to: ? Chew on the unaffected side of your mouth. ? Avoid touching your face. ? Avoid blasts of hot or cold air.  Follow your treatment plan as told by your health care provider. This may include: ? Cognitive or behavioral therapy. ? Gentle, regular exercise. ? Meditation or yoga. ? Aromatherapy.  Keep all follow-up visits as told by your health care provider. You may need to be monitored closely to make sure treatment is working well for you. Where to find more information  Facial Pain Association: fpa-support.org Contact a health care provider if:  Your medicine is not helping your symptoms.  You have side effects from the medicine used for treatment.  You develop new, unexplained symptoms, such as: ? Double vision. ? Facial weakness. ? Facial numbness. ? Changes in hearing or balance.  You feel depressed. Get help right away if:  Your pain is severe and is not getting better.  You develop suicidal thoughts. If you ever feel like you may hurt yourself or others, or have thoughts about taking your own life, get help right away. You can go to your  nearest emergency department or call:  Your local emergency services (911 in the [place]  A suicide crisis helpline, such as the [place] at 530 714 4911. This is open 24 hours a day. Summary  Trigeminal neuralgia is a nerve disorder that causes severe pain on one side of the face. The pain may last from a few seconds to several minutes.  This condition is caused by damage or pressure to a nerve in the head that is called the trigeminal nerve.  Treatment may include avoiding  the things that trigger your symptoms, taking medicines, or having surgery or procedures. It may take up to one month for treatment to start relieving the pain.  Avoid the things that trigger your symptoms.  Keep all follow-up visits as told by your health care provider. You may need to be monitored closely to make sure treatment is working well for you. This information is not intended to replace advice given to you by your health care provider. Make sure you discuss any questions you have with your health care provider. Document Revised: 07/29/2018 Document Reviewed: 07/29/2018 Elsevier Patient Education  [place].

## 2020-04-10 ENCOUNTER — Ambulatory Visit: Payer: 59 | Attending: Internal Medicine

## 2020-04-10 DIAGNOSIS — Z23 Encounter for immunization: Secondary | ICD-10-CM

## 2020-04-10 NOTE — Progress Notes (Signed)
   Covid-19 Vaccination Clinic  Name:  Maria Glenn    MRN: [id number] DOB: Jul 05, 1966  04/10/2020  [name] was observed post Covid-19 immunization for 15 minutes without incident. She was provided with Vaccine Information Sheet and instruction to access the V-Safe system.   [name] was instructed to call 911 with any severe reactions post vaccine: Marland Kitchen Difficulty breathing  . Swelling of face and throat  . A fast heartbeat  . A bad rash all over body  . Dizziness and weakness   Immunizations Administered    Name Date Dose VIS Date Route   Pfizer COVID-19 Vaccine 04/10/2020  9:29 AM 0.3 mL 11/19/2018 Intramuscular   Manufacturer: [place]   Lot: [id number]   [place]: [id number]

## 2020-04-22 ENCOUNTER — Ambulatory Visit (INDEPENDENT_AMBULATORY_CARE_PROVIDER_SITE_OTHER): Payer: 59 | Admitting: Bariatrics

## 2020-04-29 ENCOUNTER — Other Ambulatory Visit: Payer: Self-pay | Admitting: Physician Assistant

## 2020-05-26 ENCOUNTER — Encounter (INDEPENDENT_AMBULATORY_CARE_PROVIDER_SITE_OTHER): Payer: Self-pay | Admitting: Family Medicine

## 2020-05-26 ENCOUNTER — Ambulatory Visit (INDEPENDENT_AMBULATORY_CARE_PROVIDER_SITE_OTHER): Payer: 59 | Admitting: Family Medicine

## 2020-05-26 ENCOUNTER — Other Ambulatory Visit: Payer: Self-pay

## 2020-05-26 VITALS — BP 113/55 | HR 71 | Temp 98.3°F | Ht 66.0 in | Wt 222.0 lb

## 2020-05-26 DIAGNOSIS — Z6836 Body mass index (BMI) 36.0-36.9, adult: Secondary | ICD-10-CM

## 2020-05-26 DIAGNOSIS — E559 Vitamin D deficiency, unspecified: Secondary | ICD-10-CM

## 2020-05-26 MED ORDER — VITAMIN D (ERGOCALCIFEROL) 1.25 MG (50000 UNIT) PO CAPS: 50000.0000 [IU] | ORAL_CAPSULE | ORAL | 0 refills | Status: DC

## 2020-05-26 NOTE — Progress Notes (Signed)
Chief Complaint:   OBESITY [name] is here to discuss her progress with her obesity treatment plan along with follow-up of her obesity related diagnoses. Maria Glenn is on the Category 2 Plan + 100 calories and states she is following her eating plan approximately 0% of the time. Maria Glenn states she is doing 0 minutes 0 times per week.  Today's visit was #: 3 Starting weight: 216 lbs Starting date: 03/23/2020 Today's weight: 222 lbs Today's date: 05/26/2020 Total lbs lost to date: 0 Total lbs lost since last in-office visit: 0  Interim History: Maria Glenn has been off track with her diet prescription over the Summer, but she is ready to get back on track with weight loss efforts.  Subjective:   1. Vitamin D deficiency Maria Glenn is not on prescription Vit D, and she denies nauses or vomiting. She is on OTC Vit D now instead. She notes fatigue.  Assessment/Plan:   1. Vitamin D deficiency Low Vitamin D level contributes to fatigue and are associated with obesity, breast, and colon cancer. We will refill prescription Vitamin D for 1 month, and Maria Glenn will continue OTC Vit D. She will follow-up for routine testing of Vitamin D, at least 2-3 times per year to avoid over-replacement.  - Vitamin D, Ergocalciferol, (DRISDOL) 1.25 MG (50000 UNIT) CAPS capsule; Take 1 capsule (50,000 Units total) by mouth every 7 (seven) days.  Dispense: 4 capsule; Refill: 0  2. Class 2 severe obesity with serious comorbidity and body mass index (BMI) of 36.0 to 36.9 in adult, unspecified obesity type (HCC) Maria Glenn is currently in the action stage of change. As such, her goal is to continue with weight loss efforts. She has agreed to change to keeping a food journal and adhering to recommended goals of 1200-1400 calories and 75+ grams of protein daily.   Behavioral modification strategies: increasing lean protein intake.  Maria Glenn has agreed to follow-up with our clinic in 2 to 3 weeks. She was informed of the importance of frequent  follow-up visits to maximize her success with intensive lifestyle modifications for her multiple health conditions.   Objective:   Blood pressure (!) 113/55, pulse 71, temperature 98.3 F (36.8 C), height 5\' 6"  (1.676 m), weight 222 lb (100.7 kg), SpO2 98 %. Body mass index is 35.83 kg/m.  General: Cooperative, alert, well developed, in no acute distress. HEENT: Conjunctivae and lids unremarkable. Cardiovascular: Regular rhythm.  Lungs: Normal work of breathing. Neurologic: No focal deficits.   Lab Results  Component Value Date   CREATININE 0.71 03/23/2020   BUN 13 03/23/2020   NA 139 03/23/2020   K 4.4 03/23/2020   CL 102 03/23/2020   CO2 26 03/23/2020   Lab Results  Component Value Date   ALT 14 03/23/2020   AST 14 03/23/2020   ALKPHOS 68 03/23/2020   BILITOT 0.3 03/23/2020   Lab Results  Component Value Date   HGBA1C 5.7 (H) 03/23/2020   HGBA1C 5.5 01/22/2020   HGBA1C 5.7 07/16/2019   HGBA1C 5.9 12/20/2017   HGBA1C 6.2 11/30/2016   Lab Results  Component Value Date   INSULIN 8.7 03/23/2020   Lab Results  Component Value Date   TSH 1.590 03/23/2020   Lab Results  Component Value Date   CHOL 255 (H) 03/23/2020   HDL 79 03/23/2020   LDLCALC 162 (H) 03/23/2020   TRIG 81 03/23/2020   CHOLHDL 4 01/22/2020   Lab Results  Component Value Date   WBC 6.4 03/23/2020   HGB 12.9  03/23/2020   HCT 39.7 03/23/2020   MCV 90 03/23/2020   PLT 227 03/23/2020   No results found for: IRON, TIBC, FERRITIN  Attestation Statements:   Reviewed by clinician on day of visit: allergies, medications, problem list, medical history, surgical history, family history, social history, and previous encounter notes.   I, Trixie Dredge, am acting as transcriptionist for Dennard Nip, MD.  I have reviewed the above documentation for accuracy and completeness, and I agree with the above. -  Dennard Nip, MD

## 2020-05-27 DIAGNOSIS — E559 Vitamin D deficiency, unspecified: Secondary | ICD-10-CM | POA: Insufficient documentation

## 2020-06-02 ENCOUNTER — Other Ambulatory Visit: Payer: Self-pay

## 2020-06-02 ENCOUNTER — Ambulatory Visit (INDEPENDENT_AMBULATORY_CARE_PROVIDER_SITE_OTHER): Payer: 59 | Admitting: Adult Health

## 2020-06-02 ENCOUNTER — Encounter (INDEPENDENT_AMBULATORY_CARE_PROVIDER_SITE_OTHER): Payer: Self-pay | Admitting: Adult Health

## 2020-06-02 VITALS — BP 115/71 | HR 75 | Temp 98.2°F | Ht 66.0 in | Wt 217.0 lb

## 2020-06-02 DIAGNOSIS — Z6835 Body mass index (BMI) 35.0-35.9, adult: Secondary | ICD-10-CM | POA: Diagnosis not present

## 2020-06-02 DIAGNOSIS — G500 Trigeminal neuralgia: Secondary | ICD-10-CM

## 2020-06-02 DIAGNOSIS — E039 Hypothyroidism, unspecified: Secondary | ICD-10-CM

## 2020-06-03 NOTE — Progress Notes (Signed)
Chief Complaint:   OBESITY Maria Glenn is here to discuss her progress with her obesity treatment plan along with follow-up of her obesity related diagnoses. Maria Glenn is on keeping a food journal and adhering to recommended goals of 1200-1400 calories and 75+ grams of protein and states she is following her eating plan approximately 100% of the time. Maria Glenn states she is doing cardio for 45 minutes 4 times per week.  Today's visit was #: 4 Starting weight: 216 lbs Starting date: 03/23/2020 Today's weight: 217 lbs Today's date: 06/02/2020 Total lbs lost to date: 0 Total lbs lost since last in-office visit: 5 lbs  Interim History: Maria Glenn has really enjoyed converting to journaling from the Category 2 meal plan.  She estimates to track and hit calorie/protein goals 100% of the time!  She has been increasing the intensity/incline during her treadmill workouts.  Subjective:   1. Acquired hypothyroidism Maria Glenn takes Euthyrox 88 mcg daily.  Thyroid panel on 03/23/2020 was within normal limits.  She reports a has family history of thyroid disorder.  Lab Results  Component Value Date   TSH 1.590 03/23/2020   2. Trigeminal neuralgia She has had symptoms for more than 20 years.  She recently established with Neurology and was provided oxcarbazepine.   Assessment/Plan:   1. Acquired hypothyroidism Patient with long-standing hypothyroidism, on levothyroxine therapy. She appears euthyroid. Orders and follow up as documented in patient record.  Counseling . Good thyroid control is important for overall health. Supratherapeutic thyroid levels are dangerous and will not improve weight loss results. . The correct way to take levothyroxine is fasting, with water, separated by at least 30 minutes from breakfast, and separated by more than 4 hours from calcium, iron, multivitamins, acid reflux medications (PPIs).   2. Trigeminal neuralgia Continue to follow with Neurology.   3. Class 2 severe obesity with  serious comorbidity and body mass index (BMI) of 35.0 to 35.9 in adult, unspecified obesity type (HCC) Maria Glenn is currently in the action stage of change. As such, her goal is to continue with weight loss efforts. She has agreed to keeping a food journal and adhering to recommended goals of 1200-1400 calories and 75 gram protein.   Handout provided:  Protein Guide.  Exercise goals: Cardio for 45 minutes 4 times per week.  Behavioral modification strategies: increasing lean protein intake, meal planning and cooking strategies, planning for success and keeping a strict food journal.  Maria Glenn has agreed to follow-up with our clinic in 2 weeks. She was informed of the importance of frequent follow-up visits to maximize her success with intensive lifestyle modifications for her multiple health conditions.   Objective:   Blood pressure 115/71, pulse 75, temperature 98.2 F (36.8 C), temperature source Oral, height 5\' 6"  (1.676 m), weight 217 lb (98.4 kg), SpO2 99 %. Body mass index is 35.02 kg/m.  General: Cooperative, alert, well developed, in no acute distress. HEENT: Conjunctivae and lids unremarkable. Cardiovascular: Regular rhythm.  Lungs: Normal work of breathing. Neurologic: No focal deficits.   Lab Results  Component Value Date   CREATININE 0.71 03/23/2020   BUN 13 03/23/2020   NA 139 03/23/2020   K 4.4 03/23/2020   CL 102 03/23/2020   CO2 26 03/23/2020   Lab Results  Component Value Date   ALT 14 03/23/2020   AST 14 03/23/2020   ALKPHOS 68 03/23/2020   BILITOT 0.3 03/23/2020   Lab Results  Component Value Date   HGBA1C 5.7 (H) 03/23/2020   HGBA1C  5.5 01/22/2020   HGBA1C 5.7 07/16/2019   HGBA1C 5.9 12/20/2017   HGBA1C 6.2 11/30/2016   Lab Results  Component Value Date   INSULIN 8.7 03/23/2020   Lab Results  Component Value Date   TSH 1.590 03/23/2020   Lab Results  Component Value Date   CHOL 255 (H) 03/23/2020   HDL 79 03/23/2020   LDLCALC 162 (H) 03/23/2020    TRIG 81 03/23/2020   CHOLHDL 4 01/22/2020   Lab Results  Component Value Date   WBC 6.4 03/23/2020   HGB 12.9 03/23/2020   HCT 39.7 03/23/2020   MCV 90 03/23/2020   PLT 227 03/23/2020   Attestation Statements:   Reviewed by clinician on day of visit: allergies, medications, problem list, medical history, surgical history, family history, social history, and previous encounter notes.  Time spent on visit including pre-visit chart review and post-visit care and charting was 27 minutes.   I, Water quality scientist, CMA, am acting as Location manager for Mina Marble, NP.  I have reviewed the above documentation for accuracy and completeness, and I agree with the above. -  Esaw Grandchild, NP

## 2020-06-09 DIAGNOSIS — E66812 Obesity, class 2: Secondary | ICD-10-CM

## 2020-06-09 DIAGNOSIS — Z6839 Body mass index (BMI) 39.0-39.9, adult: Secondary | ICD-10-CM

## 2020-06-09 DIAGNOSIS — G500 Trigeminal neuralgia: Secondary | ICD-10-CM | POA: Insufficient documentation

## 2020-06-09 HISTORY — DX: Morbid (severe) obesity due to excess calories: E66.01

## 2020-06-09 HISTORY — DX: Body mass index (BMI) 39.0-39.9, adult: Z68.39

## 2020-06-09 HISTORY — DX: Obesity, class 2: E66.812

## 2020-06-16 ENCOUNTER — Ambulatory Visit (INDEPENDENT_AMBULATORY_CARE_PROVIDER_SITE_OTHER): Payer: 59 | Admitting: Adult Health

## 2020-10-11 ENCOUNTER — Ambulatory Visit: Payer: 59 | Admitting: Neurology

## 2020-10-12 ENCOUNTER — Other Ambulatory Visit: Payer: Self-pay | Admitting: Physician Assistant

## 2020-12-07 ENCOUNTER — Encounter (INDEPENDENT_AMBULATORY_CARE_PROVIDER_SITE_OTHER): Payer: Self-pay | Admitting: Family Medicine

## 2020-12-07 ENCOUNTER — Other Ambulatory Visit: Payer: Self-pay

## 2020-12-07 ENCOUNTER — Ambulatory Visit (INDEPENDENT_AMBULATORY_CARE_PROVIDER_SITE_OTHER): Payer: 59 | Admitting: Family Medicine

## 2020-12-07 VITALS — BP 126/76 | HR 77 | Temp 97.7°F | Ht 66.0 in | Wt 241.0 lb

## 2020-12-07 DIAGNOSIS — E7849 Other hyperlipidemia: Secondary | ICD-10-CM

## 2020-12-07 DIAGNOSIS — R0602 Shortness of breath: Secondary | ICD-10-CM

## 2020-12-07 DIAGNOSIS — E038 Other specified hypothyroidism: Secondary | ICD-10-CM

## 2020-12-07 DIAGNOSIS — F32A Depression, unspecified: Secondary | ICD-10-CM | POA: Insufficient documentation

## 2020-12-07 DIAGNOSIS — F3289 Other specified depressive episodes: Secondary | ICD-10-CM

## 2020-12-07 DIAGNOSIS — Z9189 Other specified personal risk factors, not elsewhere classified: Secondary | ICD-10-CM | POA: Diagnosis not present

## 2020-12-07 DIAGNOSIS — E559 Vitamin D deficiency, unspecified: Secondary | ICD-10-CM

## 2020-12-07 DIAGNOSIS — R5383 Other fatigue: Secondary | ICD-10-CM | POA: Diagnosis not present

## 2020-12-07 DIAGNOSIS — R7303 Prediabetes: Secondary | ICD-10-CM

## 2020-12-07 DIAGNOSIS — Z0289 Encounter for other administrative examinations: Secondary | ICD-10-CM

## 2020-12-07 DIAGNOSIS — Z6839 Body mass index (BMI) 39.0-39.9, adult: Secondary | ICD-10-CM

## 2020-12-07 DIAGNOSIS — E538 Deficiency of other specified B group vitamins: Secondary | ICD-10-CM

## 2020-12-07 HISTORY — DX: Shortness of breath: R06.02

## 2020-12-07 HISTORY — DX: Other fatigue: R53.83

## 2020-12-07 HISTORY — DX: Other specified hypothyroidism: E03.8

## 2020-12-07 HISTORY — DX: Other hyperlipidemia: E78.49

## 2020-12-08 LAB — LIPID PANEL
Chol/HDL Ratio: 4.2 ratio (ref 0.0–4.4)
Cholesterol, Total: 236 mg/dL — ABNORMAL HIGH (ref 100–199)
HDL: 56 mg/dL (ref >39)
LDL Chol Calc (NIH): 160 mg/dL — ABNORMAL HIGH (ref 0–99)
Triglycerides: 112 mg/dL (ref 0–149)
VLDL Cholesterol Cal: 20 mg/dL (ref 5–40)

## 2020-12-08 LAB — COMPREHENSIVE METABOLIC PANEL
ALT: 18 IU/L (ref 0–32)
AST: 13 IU/L (ref 0–40)
Albumin/Globulin Ratio: 1.3 (ref 1.2–2.2)
Albumin: 4 g/dL (ref 3.8–4.9)
Alkaline Phosphatase: 98 IU/L (ref 44–121)
BUN/Creatinine Ratio: 15 (ref 9–23)
BUN: 10 mg/dL (ref 6–24)
Bilirubin Total: 0.2 mg/dL (ref 0.0–1.2)
CO2: 23 mmol/L (ref 20–29)
Calcium: 9.1 mg/dL (ref 8.7–10.2)
Chloride: 98 mmol/L (ref 96–106)
Creatinine, Ser: 0.68 mg/dL (ref 0.57–1.00)
Globulin, Total: 3.1 g/dL (ref 1.5–4.5)
Glucose: 110 mg/dL — ABNORMAL HIGH (ref 65–99)
Potassium: 4.5 mmol/L (ref 3.5–5.2)
Sodium: 140 mmol/L (ref 134–144)
Total Protein: 7.1 g/dL (ref 6.0–8.5)
eGFR: 105 mL/min/{1.73_m2} (ref >59)

## 2020-12-08 LAB — HEMOGLOBIN A1C
Est. average glucose Bld gHb Est-mCnc: 134 mg/dL
Hgb A1c MFr Bld: 6.3 % — ABNORMAL HIGH (ref 4.8–5.6)

## 2020-12-08 LAB — CBC WITH DIFFERENTIAL/PLATELET
Basophils Absolute: 0 10*3/uL (ref 0.0–0.2)
Basos: 1 %
EOS (ABSOLUTE): 0.3 10*3/uL (ref 0.0–0.4)
Eos: 3 %
Hematocrit: 38.8 % (ref 34.0–46.6)
Hemoglobin: 12.7 g/dL (ref 11.1–15.9)
Immature Grans (Abs): 0 10*3/uL (ref 0.0–0.1)
Immature Granulocytes: 1 %
Lymphocytes Absolute: 2.5 10*3/uL (ref 0.7–3.1)
Lymphs: 29 %
MCH: 27.9 pg (ref 26.6–33.0)
MCHC: 32.7 g/dL (ref 31.5–35.7)
MCV: 85 fL (ref 79–97)
Monocytes Absolute: 0.5 10*3/uL (ref 0.1–0.9)
Monocytes: 6 %
Neutrophils Absolute: 5.2 10*3/uL (ref 1.4–7.0)
Neutrophils: 60 %
Platelets: 284 10*3/uL (ref 150–450)
RBC: 4.55 x10E6/uL (ref 3.77–5.28)
RDW: 14.1 % (ref 11.7–15.4)
WBC: 8.5 10*3/uL (ref 3.4–10.8)

## 2020-12-08 LAB — T4, FREE: Free T4: 1.26 ng/dL (ref 0.82–1.77)

## 2020-12-08 LAB — VITAMIN B12: Vitamin B-12: 587 pg/mL (ref 232–1245)

## 2020-12-08 LAB — VITAMIN D 25 HYDROXY (VIT D DEFICIENCY, FRACTURES): Vit D, 25-Hydroxy: 24.5 ng/mL — ABNORMAL LOW (ref 30.0–100.0)

## 2020-12-08 LAB — INSULIN, RANDOM: INSULIN: 13 u[IU]/mL (ref 2.6–24.9)

## 2020-12-08 LAB — T3: T3, Total: 89 ng/dL (ref 71–180)

## 2020-12-08 LAB — TSH: TSH: 4.79 u[IU]/mL — ABNORMAL HIGH (ref 0.450–4.500)

## 2020-12-08 LAB — FOLATE: Folate: 4.2 ng/mL (ref >3.0)

## 2020-12-08 NOTE — Progress Notes (Signed)
Chief Complaint:   Maria Glenn (MR# [id number]) is a 55 y.o. female who presents for evaluation and treatment of Maria and related comorbidities. Current BMI is Body mass index is 38.9 kg/m. Maria Glenn has been struggling with her weight for many years and has been unsuccessful in either losing weight, maintaining weight loss, or reaching her healthy weight goal.  Maria Glenn is currently in the action stage of change and ready to dedicate time achieving and maintaining a healthier weight. Maria Glenn is interested in becoming our patient and working on intensive lifestyle modifications including (but not limited to) diet and exercise for weight loss.  Maria Glenn lives with her spouse, Maria Glenn, and her 26 year old son.  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  She would like to lose 80 pounds in 6-9 months.  She has tried Weight Watchers and [employer] in the past.  She craves salty and sweet foods.  Snacks on chips and cookies.  Occasionally skips lunch.  She says that her worst habit is eating when she is not hungry.  She was last seen by Dr. Leafy Glenn 6-8 months ago and was in the program, but she says that she was not ready to make the changes she needed to make with her diet.  She also saw Maria Glenn about 3 months ago.  Maria Glenn's habits were reviewed today and are as follows: Her family eats meals together, she thinks her family will eat healthier with her, she struggles with family and or coworkers weight loss sabotage, her desired weight loss is 80 lbs, she has been heavy most of her life, she started gaining excessive weight within the last year, her heaviest weight ever was 249 pounds, she craves sweets and salty foods, she skips lunch frequently, she frequently makes poor food choices, she frequently eats larger portions than normal and she struggles with emotional eating.  Depression Screen Maria Glenn's Food and Mood (modified PHQ-9) score was 11.  Depression screen Maria Glenn 2/9 12/07/2020  Decreased  Interest 3  Down, Depressed, Hopeless 1  PHQ - 2 Score 4  Altered sleeping 1  Tired, decreased energy 3  Change in appetite 1  Feeling bad or failure about yourself  1  Trouble concentrating 0  Moving slowly or fidgety/restless 1  Suicidal thoughts 0  PHQ-9 Score 11  Difficult doing work/chores Not difficult at all   Assessment/Plan:   Orders Placed This Encounter  Procedures  . Vitamin B12  . CBC with Differential/Platelet  . Comprehensive metabolic panel  . Folate  . Hemoglobin A1c  . Insulin, random  . Lipid panel  . T3  . T4, free  . TSH  . VITAMIN D 25 Hydroxy (Vit-D Deficiency, Fractures)  . EKG 12-Lead    Medications Discontinued During This Encounter  Medication Reason  . Vitamin D, Ergocalciferol, (DRISDOL) 1.25 MG (50000 UNIT) CAPS capsule       1. Other fatigue Maria Glenn reports daytime somnolence and reports waking up still tired. Patent has a history of symptoms of daytime fatigue, morning fatigue and snoring. Maria Glenn generally gets 8 or 9 hours of sleep per night, and states that she has generally restful sleep. Snoring is present. Apneic episodes are not present. Epworth Sleepiness Score is 4.  [name] does feel that her weight is causing her energy to be lower than it should be. Fatigue may be related to Maria, depression or many other causes. Labs will be ordered, and in the meanwhile, [name] will focus on self care  including making healthy food choices, increasing physical activity and focusing on stress reduction.  - EKG 12-Lead - CBC with Differential/Platelet - Comprehensive metabolic panel  2. SOB (shortness of breath) on exertion Maria Glenn notes increasing shortness of breath with exercising and seems to be worsening over time with weight gain. She notes getting out of breath sooner with activity than she used to. This has gotten worse recently. Maria Glenn denies shortness of breath at rest or orthopnea.  Maria Glenn does feel that she gets out of breath more easily  that she used to when she exercises. [name]'s shortness of breath appears to be Maria related and exercise induced. She has agreed to work on weight loss and gradually increase exercise to treat her exercise induced shortness of breath. Will continue to monitor closely.  - CBC with Differential/Platelet - Comprehensive metabolic panel  3. Other specified hypothyroidism Course: Stable. Medication: Synthroid 88 mcg daily.  No change in dose in a long time.  Last labs 3+ months ago at Maria Glenn.  Plan: Patient was instructed not to take MVM or iron within 4 hours of taking thyroid medications.  We will continue to monitor alongside Maria Glenn/Maria Glenn as it relates to her weight loss journey.  Will check labs today.  Continue medication for now.  Prudent nutritional plan, weight loss.  Lab Results  Component Value Date   TSH 1.590 03/23/2020   - T3 - T4, free - TSH  4. Prediabetes Improving, but not optimized. Goal is HgbA1c < 5.7.  Medication: None now or in the past.  She was recently diagnosed 4 years ago with an A1c of 6.2.  Plan:  She will continue to focus on protein-rich, low simple carbohydrate foods. We reviewed the importance of hydration, regular exercise for stress reduction, and restorative sleep.  Will check labs today.  Prudent nutritional plan, weight loss.  Lab Results  Component Value Date   HGBA1C 5.7 (H) 03/23/2020   Lab Results  Component Value Date   INSULIN 8.7 03/23/2020   - Comprehensive metabolic panel - Hemoglobin A1c - Insulin, random  5. Other hyperlipidemia Course: Uncontrolled.  Lipid-lowering medications: None currently, but she was on medication in the past and took herself off of it.  Maria Glenn says she was first diagnosed around 3-4 months ago, but labs were abnormal years ago.    Plan: Dietary changes: Increase soluble fiber, decrease simple carbohydrates, decrease saturated fat. Exercise changes: Moderate to vigorous-intensity aerobic activity 150  minutes per week or as tolerated. We will continue to monitor along with Maria Glenn/specialists as it pertains to her weight loss journey.  Will check labs today.  Prudent nutritional plan and eventually increase exercise and focus on decreasing saturated/trans fat.  The 10-year ASCVD risk score Maria Bussing DC Brooke Bonito., et al., 2013) is: 1.9%   Values used to calculate the score:     Age: 63 years     Sex: Female     Is Non-Hispanic African American: No     Diabetic: No     Tobacco smoker: No     Systolic Blood Pressure: 130 mmHg     Is BP treated: No     HDL Cholesterol: 56 mg/dL     Total Cholesterol: 236 mg/dL  - Lipid panel  6. B12 deficiency Lab Results  Component Value Date   [id number] 03/23/2020   She says she was told she was deficient in B12 in the past.  She is on no supplement or multivitamin now.  Plan:  checl labs,  prudent nutritional plan.  Will replace B12 if need be in the future.  - Vitamin B12 - Folate  7. Vitamin D deficiency Not at goal. Current vitamin D is 27.0, tested on 03/23/2020. Optimal goal > 50 ng/dL.  She says she had leg pain/aches with weekly vitamin D.  Plan:  Check labs, weight loss, replace vitamin D in the future if needed.  - VITAMIN D 25 Hydroxy (Vit-D Deficiency, Fractures)  8. Other depression with emotional eating Not at goal. Medication: None.  Denies feeling depressed, but has emotional eating behaviors.  PHQ-9 is 11.  Denies SI.  Plan:  Behavior modification techniques were discussed today to help deal with emotional/non-hunger eating behaviors.  Declines referral to Dr. Mallie Mussel at this time.  May consider in the future.    9. At risk for heart disease Due to Felicidad's current state of health and medical condition(s), she is at a higher risk for heart disease.  This puts the patient at much greater risk to subsequently develop cardiopulmonary conditions that can significantly affect patient's quality of life in a negative manner.    At least 23  minutes were spent on counseling Kera about these concerns today, and I stressed the importance of reversing risks factors of Maria, especially truncal and visceral fat, hypertension, hyperlipidemia, and pre-diabetes.  The initial goal is to lose at least 5-10% of starting weight to help reduce these risk factors.  Counseling:  Intensive lifestyle modifications were discussed with Maria Glenn as the most appropriate first line of treatment.  she will continue to work on diet, exercise, and weight loss efforts.  We will continue to reassess these conditions on a fairly regular basis in an attempt to decrease the patient's overall morbidity and mortality.  Evidence-based interventions for health behavior change were utilized today including the discussion of self monitoring techniques, problem-solving barriers, and SMART goal setting techniques.  Specifically, regarding patient's less desirable eating habits and patterns, we employed the technique of small changes when Maria Glenn has not been able to fully commit to her prudent nutritional plan.  10. Class 2 severe Maria with serious comorbidity and body mass index (BMI) of 39.0 to 39.9 in adult, unspecified Maria type (HCC)  Mykalah is currently in the action stage of change and her goal is to continue with weight loss efforts. I recommend Maria Glenn begin the structured treatment plan as follows:  She has agreed to keeping a food journal and adhering to recommended goals of 1600-1700 calories and 120+ grams of protein daily.  Exercise goals: As is.   Behavioral modification strategies: increasing lean protein intake, decreasing simple carbohydrates, decreasing liquid calories, no skipping meals, meal planning and cooking strategies, emotional eating strategies, avoiding temptations and planning for success.  I recommend that she journal about why she wants to get healthy and why she wants to lose weight.  We discussed vision boards today.  She was informed of  the importance of frequent follow-up visits to maximize her success with intensive lifestyle modifications for her multiple health conditions. She was informed we would discuss her lab results at her next visit unless there is a critical issue that needs to be addressed sooner. Maria Glenn agreed to keep her next visit at the agreed upon time to discuss these results.  Objective:   Blood pressure 126/76, Glenn 77, temperature 97.7 F (36.5 C), height 5\' 6"  (1.676 m), weight 241 lb (109.3 kg), SpO2 99 %. Body mass index is 38.9 kg/m.  EKG: Normal sinus rhythm, rate 82  bpm.  Indirect Calorimeter completed today shows a VO2 of 306 and a REE of 2130.  Her calculated basal metabolic rate is 7893 thus her basal metabolic rate is better than expected.  General: Cooperative, alert, well developed, in no acute distress. HEENT: Conjunctivae and lids unremarkable. Cardiovascular: Regular rhythm.  Lungs: Normal work of breathing. Neurologic: No focal deficits.   Lab Results  Component Value Date   HGBA1C 5.7 (H) 03/23/2020   HGBA1C 5.5 01/22/2020   HGBA1C 5.7 07/16/2019   HGBA1C 5.9 12/20/2017   Lab Results  Component Value Date   INSULIN 8.7 03/23/2020   Attestation Statements:   This is the patient's first visit at [facility]. The patient's NEW PATIENT PACKET was reviewed at length. Included in the packet: current and past health history, medications, allergies, ROS, gynecologic history (women only), surgical history, family history, social history, weight history, weight loss surgery history (for those that have had weight loss surgery), nutritional evaluation, mood and food questionnaire, PHQ9, Epworth questionnaire, sleep habits questionnaire, patient life and health improvement goals questionnaire. These will all be scanned into the patient's chart under media.   During the visit, I independently reviewed the patient's EKG, bioimpedance scale results, and indirect calorimeter  results. I used this information to tailor a meal plan for the patient that will help her to lose weight and will improve her Maria-related conditions going forward. I performed a medically necessary appropriate examination and/or evaluation. I discussed the assessment and treatment plan with the patient. The patient was provided an opportunity to ask questions and all were answered. The patient agreed with the plan and demonstrated an understanding of the instructions. Labs were ordered at this visit and will be reviewed at the next visit unless more critical results need to be addressed immediately. Clinical information was updated and documented in the EMR.   I, Water quality scientist, CMA, am acting as Location manager for [employer], DO.  I have reviewed the above documentation for accuracy and completeness, and I agree with the above. Marjory Sneddon, D.O.  The [place] was signed into law in 2016 which includes the topic of electronic health records.  This provides immediate access to information in MyChart.  This includes consultation notes, operative notes, office notes, lab results and pathology reports.  If you have any questions about what you read please let us know at your next visit so we can discuss your concerns and take corrective action if need be.  We are right here with you.

## 2020-12-13 ENCOUNTER — Encounter (HOSPITAL_BASED_OUTPATIENT_CLINIC_OR_DEPARTMENT_OTHER): Payer: Self-pay | Admitting: Nurse Practitioner

## 2020-12-13 ENCOUNTER — Ambulatory Visit (INDEPENDENT_AMBULATORY_CARE_PROVIDER_SITE_OTHER): Payer: 59 | Admitting: Nurse Practitioner

## 2020-12-13 ENCOUNTER — Other Ambulatory Visit: Payer: Self-pay

## 2020-12-13 VITALS — BP 132/66 | HR 81 | Ht 66.0 in | Wt 240.6 lb

## 2020-12-13 DIAGNOSIS — H65111 Acute and subacute allergic otitis media (mucoid) (sanguinous) (serous), right ear: Secondary | ICD-10-CM | POA: Diagnosis not present

## 2020-12-13 DIAGNOSIS — Z6838 Body mass index (BMI) 38.0-38.9, adult: Secondary | ICD-10-CM

## 2020-12-13 DIAGNOSIS — J40 Bronchitis, not specified as acute or chronic: Secondary | ICD-10-CM

## 2020-12-13 DIAGNOSIS — F3289 Other specified depressive episodes: Secondary | ICD-10-CM

## 2020-12-13 DIAGNOSIS — J329 Chronic sinusitis, unspecified: Secondary | ICD-10-CM | POA: Diagnosis not present

## 2020-12-13 DIAGNOSIS — Z7689 Persons encountering health services in other specified circumstances: Secondary | ICD-10-CM

## 2020-12-13 DIAGNOSIS — I82532 Chronic embolism and thrombosis of left popliteal vein: Secondary | ICD-10-CM

## 2020-12-13 DIAGNOSIS — E782 Mixed hyperlipidemia: Secondary | ICD-10-CM | POA: Insufficient documentation

## 2020-12-13 DIAGNOSIS — Z0000 Encounter for general adult medical examination without abnormal findings: Secondary | ICD-10-CM | POA: Insufficient documentation

## 2020-12-13 DIAGNOSIS — E039 Hypothyroidism, unspecified: Secondary | ICD-10-CM | POA: Diagnosis not present

## 2020-12-13 DIAGNOSIS — D6851 Activated protein C resistance: Secondary | ICD-10-CM

## 2020-12-13 HISTORY — DX: Chronic sinusitis, unspecified: J32.9

## 2020-12-13 HISTORY — DX: Body mass index (BMI) 38.0-38.9, adult: Z68.38

## 2020-12-13 HISTORY — DX: Acute and subacute allergic otitis media (mucoid) (sanguinous) (serous), right ear: H65.111

## 2020-12-13 HISTORY — DX: Bronchitis, not specified as acute or chronic: J40

## 2020-12-13 MED ORDER — AZITHROMYCIN 250 MG PO TABS
ORAL_TABLET | ORAL | 0 refills | Status: DC
Start: 2020-12-13 — End: 2020-12-21

## 2020-12-13 MED ORDER — MONTELUKAST SODIUM 10 MG PO TABS: 10.0000 mg | ORAL_TABLET | Freq: Every day | ORAL | 3 refills | Status: DC

## 2020-12-13 MED ORDER — LEVOTHYROXINE SODIUM 112 MCG PO TABS: 112.0000 ug | ORAL_TABLET | Freq: Every day | ORAL | 3 refills | Status: DC

## 2020-12-13 NOTE — Assessment & Plan Note (Signed)
Chronic daily ASA therapy- no other anticoagulation at this time.  No concerns or concerning findings.  Pt reports condition stable. Labs WNL from 12/07/20 Continue to monitor.

## 2020-12-13 NOTE — Assessment & Plan Note (Addendum)
Symptoms and presentation consistent with sinobronchitis.  Given length of time symptoms have been present and failure with over the counter medication will we begin antibiotic treatment today.  Allergy to penicillins- Azithromycin sent to pharmacy.  Will also send montelukast for trial since current loratadine is not beneficial against the current allergen.  Recommend plenty of rest and increased fluid intake.  Consider mucinex for additional symptom relief.  Follow-up if symptoms worsen or fail to improve.

## 2020-12-13 NOTE — Patient Instructions (Addendum)
I have sent Singulair (montelukast) and Azithromycin (Z-pack) to the pharmacy for you.  If you are not feeling better at all by Friday, please let me know. I don't want you going into the weekend feeling unwell.    I also changed your levothyroxine to Synthroid brand and increased the dosage to 130m per day. We will want to recheck levels in about 6-8 weeks, but you can just have this done at the lab downstairs and I will call you if we need to make any changes.    [facility] I am excited for the opportunity to partner with you to meet your health care goals. It was a pleasure meeting you today!  I am an Adult-Geriatric Nurse Practitioner with a background in caring for patients for more than 20 years. I received my BPaediatric nursein Nursing and my Doctor of Nursing Practice degrees at [employer] I received additional fellowship training in primary care and sports medicine after receiving my doctorate degree. I provide primary care and sports medicine services to patients age 53and olderand older within this office. I am also a provider [facility] the director of the APP Fellowship [facility]  [place] years and am proud to be a member of this community.   I am passionate about providing the best service to you through preventive medicine and supportive care. I consider you a part of the medical team and value your input. I work diligently to ensure that you are heard and your needs are met in a safe and effective manner. I want you to feel comfortable with me as your provider and want you to know that your health concerns are important to me.   For your information, our office hours are Monday- Friday 8:00 AM - 5:00 PM At this time I am not in the office on Wednesdays.  If you  have questions or concerns, please call our office at [phone] send [place] MyChart message and we will respond as quickly as possible.   For all urgent or time sensitive needs we ask that you please call the office to avoid delays. MyChart is not constantly monitored and replies may take up to 72 business hours.  MyChart Policy: . MyChart allows for you to see your visit notes, after visit summary, provider recommendations, lab and tests results, make an appointment, request refills, and contact your provider or the office for non-urgent questions or concerns.  . Providers are seeing patients during normal business hours and do not have built in time to review MyChart messages. We ask that you allow a minimum of 72 business hours for MyChart message responses.  . Complex MyChart concerns may require a visit. Your provider may request you schedule a virtual or in person visit to ensure we are providing the best care possible. . MyChart messages sent after 4:00 PM on Friday will not be received by the provider until Monday morning.    Lab and Test Results: . You will receive your lab and test results on MyChart as soon as they are completed and results have been sent by the lab or testing facility. Due to this service, you will receive your results BEFORE your provider.  . Please allow a minimum of 72 business hours for your provider  to receive and review lab and test results and contact you about.   . Most lab and test result comments from the provider will be sent through [facility]. Your provider may recommend changes to the plan of care, follow-up visits, repeat testing, ask questions, or request an office visit to discuss these results. You may reply directly to this message or call the office at [phone] to provide information for the provider or set up an appointment. . In some instances, you will be called with test results and recommendations. Please let us know if this is preferred and we  will make note of this in your chart to provide this for you.    . If you have not heard a response to your lab or test results in 72 business hours, please call the office to let us know.   After Hours: . For all non-emergency after hours needs, please call the office at [phone] and select the option to reach the on-call provider service. On-call services are shared between multiple [facility] offices and therefore it will not be possible to speak directly with your provider. On-call providers may provide medical advice and recommendations, but are unable to provide refills for maintenance medications.  . For all emergency or urgent medical needs after normal business hours, we recommend that you seek care at the closest Urgent Care or Emergency Department to ensure appropriate treatment in a timely manner.  [facility] has a 24 hour emergency room located on the ground floor for your convenience.    Please do not hesitate to reach out to [place] with concerns.   Thank you, again, for choosing me as your health care partner. I appreciate your trust and look forward to learning more about you.   Worthy Keeler, DNP, AGNP-c ______________________________________________________________________________________________  Health Maintenance Recommendations Screening Testing  Mammogram  Every 1 -2 years based on history and risk factors  Starting at age 53  Pap Smear  Ages 21-39 every 3 years  Ages 53-65 every 5 years with HPV testing  More frequent testing may be required based on results and history  Colon Cancer Screening  Every 1-10 years based on test performed, risk factors, and history  Starting at age 53  Bone Density Screening  Every 2-10 years based on history  Starting at age 533 for women  Recommendations for men differ based on medication usage, history, and risk factors  AAA Screening  One time ultrasound  Men 5-7 years old who  have every smoked  Lung Cancer Screening  Low Dose Lung CT every 12 months  Age 53-80 years with a 30 pack-year smoking history who still smoke or who have quit within the last 15 years  Screening Labs  Routine  Labs: Complete Blood Count (CBC), Complete Metabolic Panel (CMP), Cholesterol (Lipid Panel)  Every 6-12 months based on history and medications  May be recommended more frequently based on current conditions or previous results  Hemoglobin A1c Lab  Every 3-12 months based on history and previous results  Starting at age 80 or earlier with diagnosis of diabetes, high cholesterol, BMI >26, and/or risk factors  Frequent monitoring for patients with diabetes to ensure blood sugar control  Thyroid Panel (TSH w/ T3 & T4)  Every 6 months based on history, symptoms, and risk factors  May be repeated more often if on medication  HIV  One time testing for all patients 49 and older  May be repeated more frequently for patients  with increased risk factors or exposure  Hepatitis C  One time testing for all patients 18 and older  May be repeated more frequently for patients with increased risk factors or exposure  Gonorrhea, Chlamydia  Every 12 months for all sexually active persons 13-24 years  Additional monitoring may be recommended for those who are considered high risk or who have symptoms  PSA  Men 40-47 years old with risk factors  Additional screening may be recommended from age 22-69 based on risk factors, symptoms, and history  Vaccine Recommendations  Tetanus Booster  All adults every 10 years  Flu Vaccine  All patients 6 months and older every year  COVID Vaccine  All patients 12 years and older  Initial dosing with booster  May recommend additional booster based on age and health history  HPV Vaccine  2 doses all patients age 22-26  Dosing may be considered for patients over 26  Shingles Vaccine (Shingrix)  2 doses all adults 25  years and older  Pneumonia (Pneumovax 23)  All adults 64 years and older  May recommend earlier dosing based on health history  Pneumonia (Prevnar 26)  All adults 12 years and older  Dosed 1 year after Pneumovax 23  Additional Screening, Testing, and Vaccinations may be recommended on an individualized basis based on family history, health history, risk factors, and/or exposure.

## 2020-12-13 NOTE — Assessment & Plan Note (Signed)
TSH 3/15 reveals elevated TSH levels. She requests Synthroid brand levothyroxine as she has not had much success with generic formulations.  Will plan to change prescription to Synthroid and increase dose to 156mcg per day. Follow-up with repeat labs in 6-8 weeks.  Will follow for adjustments or changes necessary.

## 2020-12-13 NOTE — Assessment & Plan Note (Signed)
New patient to establish care with this practice today.  She has recently had labs with healthy weight and wellness and therefore will not need any studies today.  She will be due for annual physical in approximately 6 months.

## 2020-12-13 NOTE — Progress Notes (Signed)
New Patient Office Visit  Subjective:  Patient ID: Maria Glenn, female    DOB: 07-18-1966  Age: 55 y.o. MRN: [id number]  CC:  Chief Complaint  Patient presents with  . Establish Care    HPI Maria Glenn is a pleasant 55 year old female who presents today to establish care. She also reports that she is experiencing allergy and sinus issues for about the past 3 weeks with no improvement in symptoms.  She is also seeing healthy weight and wellness and recently had labs performed in that office. She is in need of management for her regular medications for chronic issues including her hypothyroidism.   ALLERGY/SINUS [name] endorses moving to a new location with multiple pine trees in her area, of which she has not had much chronic exposure in the past. Over the past three weeks she endorses ear pain and fullness, sinus pain and pressure, very sore throat, cough, and fatigue. She did have COVID a few months ago and reports her symptoms are not consistent with COVID and she feels worse at this time. She has been vaccinated and boosted against COVID. She has been daily daily loratadine and intermittent mucinex without improvement of her symptoms. She is concerned she may have developed an infection today. She does have a history positive for allergy to penicillins that caused shortness of breath and she is not interested in trying this again or any medications from the penicillin family.   Past Medical History:  Diagnosis Date  . Allergy   . B12 deficiency   . Class 2 severe obesity with serious comorbidity and body mass index (BMI) of 39.0 to 39.9 in adult [facility]) 06/09/2020  . Clotting disorder ([place])    factor V Leiden  . DVT (deep venous thrombosis) ([place])   . Factor V Leiden ([place])   . History of blood clots   . History of DVT (deep vein thrombosis) 08/09/2015  . Hyperlipidemia   . Hypothyroidism   . Left leg DVT ([place]) 08/09/2015  . Other fatigue 12/07/2020  . Other hyperlipidemia  12/07/2020  . Other specified hypothyroidism 12/07/2020  . Prediabetes   . SOBOE (shortness of breath on exertion)   . SOBOE (shortness of breath on exertion) 12/07/2020  . Thyroid disease   . Trigeminal neuralgia   . Visit for preventive health examination 11/30/2016  . Vitamin D deficiency     Past Surgical History:  Procedure Laterality Date  . CESAREAN SECTION      Family History  Problem Relation Age of Onset  . Hypertension Mother   . Hyperlipidemia Mother   . Cancer Father        Lung  . Colon cancer Neg Hx   . Colon polyps Neg Hx   . Esophageal cancer Neg Hx   . Stomach cancer Neg Hx   . Rectal cancer Neg Hx     Social History   Socioeconomic History  . Marital status: Married    Spouse name: Nomi Rudnicki  . Number of children: 1  . Years of education: 72  . Highest education level: Not on file  Occupational History  . 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 Smoking status: Never Smoker  . Smokeless tobacco: Never Used  Vaping Use  . Vaping Use: Never used  Substance and Sexual Activity  . Alcohol use: No    Alcohol/week: 0.0 standard drinks  . Drug use: No  . Sexual activity: Yes    Birth control/protection:  Other-see comments    Comment: husband had vasectomy  Other Topics Concern  . Not on file  Social History Narrative   Right handed   Two story home   Drinks caffeine   Social Determinants of Health   Financial Resource Strain: Not on file  Food Insecurity: Not on file  Transportation Needs: Not on file  Physical Activity: Inactive  . Days of Exercise per Week: 0 days  . Minutes of Exercise per Session: 0 min  Stress: Not on file  Social Connections: Not on file  Intimate Partner Violence: Not on file    ROS Review of Systems  Constitutional: Positive for chills and fatigue. Negative for activity change and appetite change.  HENT: Positive for congestion, ear pain, postnasal drip, rhinorrhea, sinus pressure, sinus pain,  sneezing, sore throat, trouble swallowing and voice change.   Respiratory: Positive for cough. Negative for chest tightness, shortness of breath and wheezing.   Cardiovascular: Negative for chest pain and palpitations.  Allergic/Immunologic: Positive for environmental allergies.  Neurological: Positive for headaches.    Objective:   Today's Vitals: BP 132/66   Pulse 81   Ht 5\' 6"  (1.676 m)   Wt 240 lb 9.6 oz (109.1 kg)   SpO2 99%   BMI 38.83 kg/m   Physical Exam Vitals and nursing note reviewed.  Constitutional:      Appearance: Normal appearance.  HENT:     Head: Normocephalic.     Right Ear: Hearing normal. No drainage. A middle ear effusion is present. Tympanic membrane is erythematous and bulging. Tympanic membrane is not perforated.     Left Ear: Hearing normal. No drainage. A middle ear effusion is present. Tympanic membrane is not perforated or erythematous.     Nose: Mucosal edema, congestion and rhinorrhea present.     Right Turbinates: Enlarged and swollen.     Left Turbinates: Enlarged and swollen.     Mouth/Throat:     Mouth: Mucous membranes are moist.     Pharynx: Posterior oropharyngeal erythema present. No oropharyngeal exudate.     Tonsils: No tonsillar exudate or tonsillar abscesses. 2+ on the right. 2+ on the left.  Eyes:     Extraocular Movements: Extraocular movements intact.     Conjunctiva/sclera: Conjunctivae normal.     Pupils: Pupils are equal, round, and reactive to light.  Cardiovascular:     Rate and Rhythm: Normal rate and regular rhythm.     Pulses: Normal pulses.     Heart sounds: Normal heart sounds.  Pulmonary:     Effort: Pulmonary effort is normal.     Breath sounds: Normal breath sounds.     Comments: Cough present with deep breathing.   Musculoskeletal:     Cervical back: Normal range of motion.     Right lower leg: No edema.     Left lower leg: No edema.  Lymphadenopathy:     Cervical: Cervical adenopathy present.  Skin:     General: Skin is warm and dry.     Capillary Refill: Capillary refill takes less than 2 seconds.  Neurological:     General: No focal deficit present.     Mental Status: She is alert and oriented to person, place, and time.  Psychiatric:        Mood and Affect: Mood normal.        Behavior: Behavior normal.        Thought Content: Thought content normal.        Judgment: Judgment normal.  Assessment & Plan:   Problem List Items Addressed This Visit      Cardiovascular and Mediastinum   Chronic deep vein thrombosis (DVT) of popliteal vein of left lower extremity (HCC)    Chronic daily ASA therapy- no other anticoagulation at this time.  No concerns or concerning findings.  Pt reports condition stable. Labs WNL from 12/07/20 Continue to monitor.        Respiratory   Sinobronchitis    Symptoms and presentation consistent with sinobronchitis.  Given length of time symptoms have been present and failure with over the counter medication will we begin antibiotic treatment today.  Allergy to penicillins- Azithromycin sent to pharmacy.  Will also send montelukast for trial since current loratadine is not beneficial against the current allergen.  Recommend plenty of rest and increased fluid intake.  Consider mucinex for additional symptom relief.  Follow-up if symptoms worsen or fail to improve.       Relevant Medications   azithromycin (ZITHROMAX) 250 MG tablet     Endocrine   Acquired hypothyroidism    TSH 3/15 reveals elevated TSH levels. She requests Synthroid brand levothyroxine as she has not had much success with generic formulations.  Will plan to change prescription to Synthroid and increase dose to 147mcg per day. Follow-up with repeat labs in 6-8 weeks.  Will follow for adjustments or changes necessary.       Relevant Medications   levothyroxine (SYNTHROID) 112 MCG tablet     Nervous and Auditory   Non-recurrent acute allergic otitis media of right ear     Symptoms and presentation consistent with acute OM of the right side. Will begin antibiotic treatment with Azithromycin today given PCN allergy.  Recommend increased fluids and plenty of rest.  May add mucinex and/or Flonase to regimen to help with symptom management.  Follow-up if symptoms worsen or fail to improve.       Relevant Medications   azithromycin (ZITHROMAX) 250 MG tablet   montelukast (SINGULAIR) 10 MG tablet     Hematopoietic and Hemostatic   Heterozygous factor V Leiden mutation (HCC) (Chronic)    History of Factor V mutation with DVT history. Currently on ASA therapy with no concerns today.  Will continue to monitor.         Other   Depression    Recent PHQ-9 elevated with healthy weight and wellness. She is not on any current medications, but prefers to avoid medications if possible. Will continue to work on diet and exercise habits to help with mood. TSH was high and levothyroxine dose adjusted today. I am hopeful that this will help with mood, as well.  Plan to follow-up in 6 months or sooner if needed.       Moderate mixed hyperlipidemia not requiring statin therapy    Elevated lipids indicated on recent results from 12/07/20 drawn by health weight and wellness.  Discussed with patient the option to work on dietary improvement and exercise to see if we can get these numbers improved without medication.  She is agreeable to this plan.  Will plan to recheck in about 6 months to see if there have been significant improvements and monitor for the need for additional intervention.        Encounter to establish care - Primary    New patient to establish care with this practice today.  She has recently had labs with healthy weight and wellness and therefore will not need any studies today.  She will be due for  annual physical in approximately 6 months.        BMI 38.0-38.9,adult    BMI 38.83. She is currently followed by [facility] and just restarted this service a  few weeks ago. She is eager to make changes to her diet and exercise regimen to work towards optimal health.  Encouraged patient to continue with [place] services. Feel free to reach out to me with any needs that I can assist with on this journey.         Outpatient Encounter Medications as of 12/13/2020  Medication Sig  . aspirin 81 MG chewable tablet Chew 81 mg by mouth daily.  Marland Kitchen azithromycin (ZITHROMAX) 250 MG tablet Take 2 tabs (500 mg) together on the first day, then 1 tab (250 mg) daily until prescription complete.  . levothyroxine (SYNTHROID) 112 MCG tablet Take 1 tablet (112 mcg total) by mouth daily before breakfast.  . loratadine (CLARITIN) 10 MG tablet Take 10 mg by mouth daily.  . montelukast (SINGULAIR) 10 MG tablet Take 1 tablet (10 mg total) by mouth at bedtime.  . [DISCONTINUED] EUTHYROX 88 MCG tablet TAKE 1 TABLET BY MOUTH ONCE DAILY BEFORE BREAKFAST   No facility-administered encounter medications on file as of 12/13/2020.    Follow-up: Return in about 6 months (around 06/15/2021) for 6-8 weeks TSH labs (lab appt only), 6 months CPE .   Orma Render, NP

## 2020-12-13 NOTE — Assessment & Plan Note (Signed)
BMI 38.83. She is currently followed by [facility] and just restarted this service a few weeks ago. She is eager to make changes to her diet and exercise regimen to work towards optimal health.  Encouraged patient to continue with [place] services. Feel free to reach out to me with any needs that I can assist with on this journey.

## 2020-12-13 NOTE — Assessment & Plan Note (Signed)
Recent PHQ-9 elevated with healthy weight and wellness. She is not on any current medications, but prefers to avoid medications if possible. Will continue to work on diet and exercise habits to help with mood. TSH was high and levothyroxine dose adjusted today. I am hopeful that this will help with mood, as well.  Plan to follow-up in 6 months or sooner if needed.

## 2020-12-13 NOTE — Assessment & Plan Note (Signed)
Elevated lipids indicated on recent results from 12/07/20 drawn by health weight and wellness.  Discussed with patient the option to work on dietary improvement and exercise to see if we can get these numbers improved without medication.  She is agreeable to this plan.  Will plan to recheck in about 6 months to see if there have been significant improvements and monitor for the need for additional intervention.

## 2020-12-13 NOTE — Assessment & Plan Note (Signed)
History of Factor V mutation with DVT history. Currently on ASA therapy with no concerns today.  Will continue to monitor.

## 2020-12-13 NOTE — Assessment & Plan Note (Signed)
Symptoms and presentation consistent with acute OM of the right side. Will begin antibiotic treatment with Azithromycin today given PCN allergy.  Recommend increased fluids and plenty of rest.  May add mucinex and/or Flonase to regimen to help with symptom management.  Follow-up if symptoms worsen or fail to improve.

## 2020-12-21 ENCOUNTER — Ambulatory Visit (INDEPENDENT_AMBULATORY_CARE_PROVIDER_SITE_OTHER): Payer: 59 | Admitting: Family Medicine

## 2020-12-21 ENCOUNTER — Other Ambulatory Visit: Payer: Self-pay

## 2020-12-21 ENCOUNTER — Encounter (INDEPENDENT_AMBULATORY_CARE_PROVIDER_SITE_OTHER): Payer: Self-pay | Admitting: Family Medicine

## 2020-12-21 VITALS — BP 129/78 | HR 70 | Temp 97.9°F | Ht 66.0 in | Wt 232.0 lb

## 2020-12-21 DIAGNOSIS — E559 Vitamin D deficiency, unspecified: Secondary | ICD-10-CM | POA: Diagnosis not present

## 2020-12-21 DIAGNOSIS — Z9189 Other specified personal risk factors, not elsewhere classified: Secondary | ICD-10-CM | POA: Insufficient documentation

## 2020-12-21 DIAGNOSIS — Z6838 Body mass index (BMI) 38.0-38.9, adult: Secondary | ICD-10-CM

## 2020-12-21 DIAGNOSIS — E7849 Other hyperlipidemia: Secondary | ICD-10-CM

## 2020-12-21 DIAGNOSIS — E039 Hypothyroidism, unspecified: Secondary | ICD-10-CM | POA: Diagnosis not present

## 2020-12-21 DIAGNOSIS — R7303 Prediabetes: Secondary | ICD-10-CM | POA: Diagnosis not present

## 2020-12-21 MED ORDER — LEVOTHYROXINE SODIUM 88 MCG PO TABS: 88.0000 ug | ORAL_TABLET | Freq: Every day | ORAL | 0 refills | Status: DC

## 2020-12-21 NOTE — Patient Instructions (Signed)
The 10-year ASCVD risk score Mikey Bussing DC Brooke Bonito., et al., 2013) is: 1.8%   Values used to calculate the score:     Age: 55 years     Sex: Female     Is Non-Hispanic African American: No     Diabetic: No     Tobacco smoker: No     Systolic Blood Pressure: 527 mmHg     Is BP treated: No     HDL Cholesterol: 56 mg/dL     Total Cholesterol: 236 mg/dL

## 2020-12-30 NOTE — Progress Notes (Signed)
Chief Complaint:   OBESITY Maria Glenn is here to discuss her progress with her obesity treatment plan along with follow-up of her obesity related diagnoses.   Today's visit was #: 2 Starting weight: 241 lbs Starting date: 12/07/2020 Today's weight: 232 lbs Today's date: 12/21/2020 Total lbs lost to date: 9 lbs Body mass index is 37.45 kg/m.  Total weight loss percentage to date: -3.73%  Interim History:   Maria Glenn is here today for her first follow-up office visit since starting the program with [place].   We reviewed her NEW Meal Plan and discussed all recent labs done here and/ or done at outside facilities.  Extended time was spent counseling Maria Glenn on all new disease processes that were discovered or that are worsening.    she is following the meal plan with only minor concerns/ questions today.   Patient's meal and food recall appears to be accurate and consistent with what is on the plan when she is following it.   When on plan, her hunger and cravings are well controlled.    Maria Glenn was sick with a sinus infection for 1 week or so and skipped about 1 meal per day, usually lunch.  She did not journal or calculate grams of protein or calories at all.  She did not eat as much food as she was supposed to.  Current Meal Plan: keeping a food journal and adhering to recommended goals of 1600-1700 calories and 120 grams of protein for 10% of the time (due to illness).  Current Exercise Plan: Treadmill/weights for 30 minutes 1 time per week.  Assessment/Plan:   Medications Discontinued During This Encounter  Medication Reason  . azithromycin (ZITHROMAX) 250 MG tablet   . loratadine (CLARITIN) 10 MG tablet   . levothyroxine (SYNTHROID) 112 MCG tablet     Meds ordered this encounter  Medications  . levothyroxine (SYNTHROID) 88 MCG tablet    Sig: Take 1 tablet (88 mcg total) by mouth daily. Brand only    Dispense:  30 tablet    Refill:  0    Brand ONLY. Pt Needs OV for RF      1. Prediabetes Not at goal. Goal is HgbA1c < 5.7.  Medication: None.  A1c from 5.7  ~8 months ago to 6.3 now.  Elevated FI from 8.7 to 13.0 now.  Plan:  Worsening. Discussed labs with patient today.  She will continue to focus on protein-rich, low simple carbohydrate foods. We reviewed the importance of hydration, regular exercise for stress reduction, and restorative sleep.    Lab Results  Component Value Date   HGBA1C 6.3 (H) 12/07/2020   Lab Results  Component Value Date   INSULIN 13.0 12/07/2020   INSULIN 8.7 03/23/2020   2. Acquired hypothyroidism Medication: Synthroid 88 mcg daily.  PCP saw labs and increased her Synthroid dose and changed to name brand instead of generic.  She was previously on 88 mcg daily.  PCP changed to 112 mcg daily.  Plan:  Discussed labs with patient today.  Patient was instructed not to take MVM or iron within 4 hours of taking thyroid medications. This issue is managed by her PCP. We will continue to monitor alongside Endocrinology/PCP as it relates to her weight loss journey.  In 6-8 weeks she has an appointment with her PCP for recheck of her labs after medication dose change.   I recommend that since TSH is only slightly elevated and T4 level within normal limits, she  continue 88 mcg daily with recheck of labs in 2-3 months (likely stress response).  Lab Results  Component Value Date   TSH 4.790 (H) 12/07/2020   - Refill levothyroxine (SYNTHROID) 88 MCG tablet; Take 1 tablet (88 mcg total) by mouth daily. Brand only  Dispense: 30 tablet; Refill: 0  3. Other hyperlipidemia Course:  Not at goal. Lipid-lowering medications: None.  Elevated LDL and HDL came down slightly from prior.  Plan:  Discussed labs with patient today.  No need for medication.  Continue prudent nutritional plan, lifestyle changes, weight loss.  Dietary changes: Increase soluble fiber, decrease simple carbohydrates, decrease saturated fat. Exercise changes: Moderate to  vigorous-intensity aerobic activity 150 minutes per week or as tolerated. We will continue to monitor along with PCP/specialists as it pertains to her weight loss journey.  Lab Results  Component Value Date   CHOL 236 (H) 12/07/2020   HDL 56 12/07/2020   LDLCALC 160 (H) 12/07/2020   TRIG 112 12/07/2020   CHOLHDL 4.2 12/07/2020   Lab Results  Component Value Date   ALT 18 12/07/2020   AST 13 12/07/2020   ALKPHOS 98 12/07/2020   BILITOT 0.2 12/07/2020   The 10-year ASCVD risk score Maria Glenn DC Jr., et al., 2013) is: 1.9%   Values used to calculate the score:     Age: 71 years     Sex: Female     Is Non-Hispanic African American: No     Diabetic: No     Tobacco smoker: No     Systolic Blood Pressure: 132 mmHg     Is BP treated: No     HDL Cholesterol: 56 mg/dL     Total Cholesterol: 236 mg/dL  4. Vitamin D deficiency Not at goal. Current vitamin D is 24.5, tested on 12/07/2020. Optimal goal > 50 ng/dL.   Plan:  Worsening.  Discussed labs with patient today.  Start OTC vitamin D 5,000 IU daily due to the fact that she did not tolerate prescription vitamin D in the past due to RLS type symptoms in her lets.  Will recheck vitamin D level in 2-3 months.   5. At risk for impaired metabolic function Due to [facility] current state of health and medical condition(s), she is at a significantly higher risk for impaired metabolic function.   At least 22 minutes was spent on counseling Haleigh about these concerns today.  This places the patient at a much greater risk to subsequently develop cardio-pulmonary conditions that can negatively affect the patient's quality of life.  I stressed the importance of reversing these risks factors.  The initial goal is to lose at least 5-10% of starting weight to help reduce risk factors.  Counseling:  Intensive lifestyle modifications discussed with Elasia as the most appropriate first line treatment.  she will continue to work on diet, exercise, and weight loss  efforts.  We will continue to reassess these conditions on a fairly regular basis in an attempt to decrease the patient's overall morbidity and mortality.  6. Class 2 severe obesity with serious comorbidity and body mass index (BMI) of 38.0 to 38.9 in adult, unspecified obesity type ([place])  Course: Vernecia is currently in the action stage of change. As such, her goal is to continue with weight loss efforts.   Nutrition goals: She has agreed to keeping a food journal and adhering to recommended goals of 1600-1700 calories and 120 grams of protein.   Exercise goals: As is.  Behavioral modification strategies: increasing lean protein  intake, decreasing simple carbohydrates, increasing water intake, decreasing sodium intake, meal planning and cooking strategies, keeping healthy foods in the home, avoiding temptations and keeping a strict food journal.  Jeiry has agreed to follow-up with our clinic in 2 weeks. She was informed of the importance of frequent follow-up visits to maximize her success with intensive lifestyle modifications for her multiple health conditions.   Objective:   Blood pressure 129/78, pulse 70, temperature 97.9 F (36.6 C), height 5\' 6"  (1.676 m), weight 232 lb (105.2 kg), SpO2 97 %. Body mass index is 37.45 kg/m.  General: Cooperative, alert, well developed, in no acute distress. HEENT: Conjunctivae and lids unremarkable. Cardiovascular: Regular rhythm.  Lungs: Normal work of breathing. Neurologic: No focal deficits.   Lab Results  Component Value Date   CREATININE 0.68 12/07/2020   BUN 10 12/07/2020   NA 140 12/07/2020   K 4.5 12/07/2020   CL 98 12/07/2020   CO2 23 12/07/2020   Lab Results  Component Value Date   ALT 18 12/07/2020   AST 13 12/07/2020   ALKPHOS 98 12/07/2020   BILITOT 0.2 12/07/2020   Lab Results  Component Value Date   HGBA1C 6.3 (H) 12/07/2020   HGBA1C 5.7 (H) 03/23/2020   HGBA1C 5.5 01/22/2020   HGBA1C 5.7 07/16/2019   HGBA1C 5.9  12/20/2017   Lab Results  Component Value Date   INSULIN 13.0 12/07/2020   INSULIN 8.7 03/23/2020   Lab Results  Component Value Date   TSH 4.790 (H) 12/07/2020   Lab Results  Component Value Date   CHOL 236 (H) 12/07/2020   HDL 56 12/07/2020   LDLCALC 160 (H) 12/07/2020   TRIG 112 12/07/2020   CHOLHDL 4.2 12/07/2020   Lab Results  Component Value Date   WBC 8.5 12/07/2020   HGB 12.7 12/07/2020   HCT 38.8 12/07/2020   MCV 85 12/07/2020   PLT 284 12/07/2020   Attestation Statements:   Reviewed by clinician on day of visit: allergies, medications, problem list, medical history, surgical history, family history, social history, and previous encounter notes.  I, Water quality scientist, CMA, am acting as Location manager for [employer], DO.  I have reviewed the above documentation for accuracy and completeness, and I agree with the above. Marjory Sneddon, D.O.  The [place] was signed into law in 2016 which includes the topic of electronic health records.  This provides immediate access to information in MyChart.  This includes consultation notes, operative notes, office notes, lab results and pathology reports.  If you have any questions about what you read please let us know at your next visit so we can discuss your concerns and take corrective action if need be.  We are right here with you.

## 2021-01-04 ENCOUNTER — Ambulatory Visit (INDEPENDENT_AMBULATORY_CARE_PROVIDER_SITE_OTHER): Payer: 59 | Admitting: Family Medicine

## 2021-01-06 ENCOUNTER — Ambulatory Visit (INDEPENDENT_AMBULATORY_CARE_PROVIDER_SITE_OTHER): Payer: 59 | Admitting: Bariatrics

## 2021-01-06 ENCOUNTER — Encounter (INDEPENDENT_AMBULATORY_CARE_PROVIDER_SITE_OTHER): Payer: Self-pay | Admitting: Bariatrics

## 2021-01-06 ENCOUNTER — Other Ambulatory Visit: Payer: Self-pay

## 2021-01-06 VITALS — BP 127/81 | HR 70 | Temp 97.9°F | Ht 66.0 in | Wt 226.0 lb

## 2021-01-06 DIAGNOSIS — R7303 Prediabetes: Secondary | ICD-10-CM | POA: Diagnosis not present

## 2021-01-06 DIAGNOSIS — Z6834 Body mass index (BMI) 34.0-34.9, adult: Secondary | ICD-10-CM | POA: Diagnosis not present

## 2021-01-06 DIAGNOSIS — E669 Obesity, unspecified: Secondary | ICD-10-CM | POA: Diagnosis not present

## 2021-01-06 DIAGNOSIS — E039 Hypothyroidism, unspecified: Secondary | ICD-10-CM | POA: Diagnosis not present

## 2021-01-11 ENCOUNTER — Encounter (INDEPENDENT_AMBULATORY_CARE_PROVIDER_SITE_OTHER): Payer: Self-pay | Admitting: Bariatrics

## 2021-01-11 NOTE — Progress Notes (Signed)
Chief Complaint:   OBESITY Maria Glenn is here to discuss her progress with her obesity treatment plan along with follow-up of her obesity related diagnoses. Maria Glenn is on keeping a food journal and adhering to recommended goals of 1600-1700 calories and 120 grams of protein and states she is following her eating plan approximately 95% of the time. Maria Glenn states she is doing yard work for exercise.  Today's visit was #: 3 Starting weight: 241 lbs Starting date: 12/07/2020 Today's weight: 226 lbs Today's date: 01/06/2021 Total lbs lost to date: 15 lbs Total lbs lost since last in-office visit: 6 lbs  Interim History: Maria Glenn is down 6 pounds since her last visit.  She is doing well with her protein.  Subjective:   1. Acquired hypothyroidism She is taking Synthroid 88 mcg daily.  Lab Results  Component Value Date   TSH 4.790 (H) 12/07/2020   2. Prediabetes Maria Glenn has a diagnosis of prediabetes based on her elevated HgA1c and was informed this puts her at greater risk of developing diabetes. She continues to work on diet and exercise to decrease her risk of diabetes. She denies nausea or hypoglycemia.  She is not taking medications for this.  Lab Results  Component Value Date   HGBA1C 6.3 (H) 12/07/2020   Lab Results  Component Value Date   INSULIN 13.0 12/07/2020   INSULIN 8.7 03/23/2020   Assessment/Plan:   1. Acquired hypothyroidism Patient with long-standing hypothyroidism, on levothyroxine therapy. She appears euthyroid. Orders and follow up as documented in patient record.  Continue Synthroid.   Counseling . Good thyroid control is important for overall health. Supratherapeutic thyroid levels are dangerous and will not improve weight loss results. . The correct way to take levothyroxine is fasting, with water, separated by at least 30 minutes from breakfast, and separated by more than 4 hours from calcium, iron, multivitamins, acid reflux medications (PPIs).   2.  Prediabetes Maria Glenn will continue to work on weight loss, exercise, and decreasing simple carbohydrates to help decrease the risk of diabetes.  Continue with weight loss and increase activity/exercise.  3. Obesity with current BMI of 36.5  Maria Glenn is currently in the action stage of change. As such, her goal is to continue with weight loss efforts. She has agreed to keeping a food journal and adhering to recommended goals of 1600-1700 calories and 120+ grams of protein.   She will work on continuing to stay strongly adherent to the plan, meal planning, and increasing her water intake.  Exercise goals: Increase exercise.  Behavioral modification strategies: increasing lean protein intake, decreasing simple carbohydrates, increasing vegetables, increasing water intake, decreasing eating out, no skipping meals, meal planning and cooking strategies, keeping healthy foods in the home and planning for success.  Maria Glenn has agreed to follow-up with our clinic in 2 weeks with Dr. Raliegh Scarlet. She was informed of the importance of frequent follow-up visits to maximize her success with intensive lifestyle modifications for her multiple health conditions.   Objective:   Blood pressure 127/81, pulse 70, temperature 97.9 F (36.6 C), height 5\' 6"  (1.676 m), weight 226 lb (102.5 kg), SpO2 99 %. Body mass index is 36.48 kg/m.  General: Cooperative, alert, well developed, in no acute distress. HEENT: Conjunctivae and lids unremarkable. Cardiovascular: Regular rhythm.  Lungs: Normal work of breathing. Neurologic: No focal deficits.   Lab Results  Component Value Date   CREATININE 0.68 12/07/2020   BUN 10 12/07/2020   NA 140 12/07/2020   K 4.5 12/07/2020  CL 98 12/07/2020   CO2 23 12/07/2020   Lab Results  Component Value Date   ALT 18 12/07/2020   AST 13 12/07/2020   ALKPHOS 98 12/07/2020   BILITOT 0.2 12/07/2020   Lab Results  Component Value Date   HGBA1C 6.3 (H) 12/07/2020   HGBA1C 5.7 (H)  03/23/2020   HGBA1C 5.5 01/22/2020   HGBA1C 5.7 07/16/2019   HGBA1C 5.9 12/20/2017   Lab Results  Component Value Date   INSULIN 13.0 12/07/2020   INSULIN 8.7 03/23/2020   Lab Results  Component Value Date   TSH 4.790 (H) 12/07/2020   Lab Results  Component Value Date   CHOL 236 (H) 12/07/2020   HDL 56 12/07/2020   LDLCALC 160 (H) 12/07/2020   TRIG 112 12/07/2020   CHOLHDL 4.2 12/07/2020   Lab Results  Component Value Date   WBC 8.5 12/07/2020   HGB 12.7 12/07/2020   HCT 38.8 12/07/2020   MCV 85 12/07/2020   PLT 284 12/07/2020   Attestation Statements:   Reviewed by clinician on day of visit: allergies, medications, problem list, medical history, surgical history, family history, social history, and previous encounter notes.  Time spent on visit including pre-visit chart review and post-visit care and charting was 20 minutes.   I, Water quality scientist, CMA, am acting as Location manager for [employer], DO  I have reviewed the above documentation for accuracy and completeness, and I agree with the above. Jearld Lesch, DO

## 2021-01-17 ENCOUNTER — Ambulatory Visit (INDEPENDENT_AMBULATORY_CARE_PROVIDER_SITE_OTHER): Payer: 59 | Admitting: Family Medicine

## 2021-01-17 ENCOUNTER — Other Ambulatory Visit: Payer: Self-pay

## 2021-01-17 VITALS — BP 113/62 | HR 61 | Temp 97.5°F | Ht 66.0 in | Wt 224.0 lb

## 2021-01-17 DIAGNOSIS — E039 Hypothyroidism, unspecified: Secondary | ICD-10-CM | POA: Diagnosis not present

## 2021-01-17 DIAGNOSIS — Z9189 Other specified personal risk factors, not elsewhere classified: Secondary | ICD-10-CM

## 2021-01-17 DIAGNOSIS — E559 Vitamin D deficiency, unspecified: Secondary | ICD-10-CM

## 2021-01-17 DIAGNOSIS — Z6838 Body mass index (BMI) 38.0-38.9, adult: Secondary | ICD-10-CM

## 2021-01-17 MED ORDER — LEVOTHYROXINE SODIUM 88 MCG PO TABS: 88.0000 ug | ORAL_TABLET | Freq: Every day | ORAL | 0 refills | Status: DC

## 2021-01-17 MED ORDER — VITAMIN D3 125 MCG (5000 UT) PO CAPS
5000.0000 [IU] | ORAL_CAPSULE | Freq: Every day | ORAL | 0 refills | Status: DC
Start: 2021-01-17 — End: 2021-04-06

## 2021-01-17 MED ORDER — FEXOFENADINE HCL 180 MG PO TABS: 180.0000 mg | ORAL_TABLET | Freq: Every day | ORAL | 3 refills | Status: DC

## 2021-01-19 NOTE — Progress Notes (Signed)
Chief Complaint:   OBESITY Maria Glenn is here to discuss her progress with her obesity treatment plan along with follow-up of her obesity related diagnoses.   Today's visit was #: 4 Starting weight: 241 lbs Starting date: 12/07/2020 Today's weight: 224 lbs Today's date: 01/17/2021 Total lbs lost to date: 17 pounds Body mass index is 36.15 kg/m.  Total weight loss percentage to date: -7.05%  Interim History:  Maria Glenn was unable to journal 90% of the time because she did not have time.  A lot of working in the yard..  Increased water intake recently - getting in 1 gallon per day.  Current Meal Plan: keeping a food journal and adhering to recommended goals of 1600-1700 calories and 120 grams of protein for 10% of the time.  Current Exercise Plan: None.  Assessment/Plan:   1. Vitamin D deficiency Not at goal. Current vitamin D is 24.5, tested on 12/07/2020. Optimal goal > 50 ng/dL.  She is taking vitamin D 5,000 IU daily.  Plan: Continue vitamin D 5,000 IU daily.  Will refill today, as per below.  Recheck after on current dose for 3 months.  - Refill Cholecalciferol (VITAMIN D3) 125 MCG (5000 UT) CAPS; Take 1 capsule (5,000 Units total) by mouth daily.  Dispense: 30 capsule; Refill: 0  2. Acquired hypothyroidism Medication: Synthroid 88 mcg daily.   Plan:  No change in energy levels but she states she has not started to exercise yet.  No change in dose today.  Patient was instructed not to take MVM or iron within 4 hours of taking thyroid medications.  We will continue to monitor alongside Endocrinology/PCP as it relates to her weight loss journey.   Lab Results  Component Value Date   TSH 4.790 (H) 12/07/2020   - Refill levothyroxine (SYNTHROID) 88 MCG tablet; Take 1 tablet (88 mcg total) by mouth daily. Brand only  Dispense: 30 tablet; Refill: 0  3. At risk for heart disease Due to [facility] current state of health and medical condition(s), she is at a higher risk for heart  disease.  This puts the patient at much greater risk to subsequently develop cardiopulmonary conditions that can significantly affect patient's quality of life in a negative manner.    At least 9 minutes were spent on counseling Maria Glenn about these concerns today, and I stressed the importance of reversing risks factors of obesity, especially truncal and visceral fat, hypertension, hyperlipidemia, and pre-diabetes.  The initial goal is to lose at least 5-10% of starting weight to help reduce these risk factors.  Counseling:  Intensive lifestyle modifications were discussed with Maria Glenn as the most appropriate first line of treatment.  she will continue to work on diet, exercise, and weight loss efforts.  We will continue to reassess these conditions on a fairly regular basis in an attempt to decrease the patient's overall morbidity and mortality.  Evidence-based interventions for health behavior change were utilized today including the discussion of self monitoring techniques, problem-solving barriers, and SMART goal setting techniques.  Specifically, regarding patient's less desirable eating habits and patterns, we employed the technique of small changes when Maria Glenn has not been able to fully commit to her prudent nutritional plan.  4. Obesity, current BMI 36.2  Course: Maria Glenn is currently in the action stage of change. As such, her goal is to continue with weight loss efforts.   Nutrition goals: She has agreed to keeping a food journal and adhering to recommended goals of 1600-1700 calories and 120 grams of  protein.   Exercise goals: Start with 10 minutes 3 days per week.  Behavioral modification strategies: increasing lean protein intake, decreasing simple carbohydrates and keeping a strict food journal.  Maria Glenn has agreed to follow-up with our clinic in 2 weeks. She was informed of the importance of frequent follow-up visits to maximize her success with intensive lifestyle modifications for her multiple  health conditions.   Objective:   Blood pressure 113/62, pulse 61, temperature (!) 97.5 F (36.4 C), height 5\' 6"  (1.676 m), weight 224 lb (101.6 kg), SpO2 98 %. Body mass index is 36.15 kg/m.  General: Cooperative, alert, well developed, in no acute distress. HEENT: Conjunctivae and lids unremarkable. Cardiovascular: Regular rhythm.  Lungs: Normal work of breathing. Neurologic: No focal deficits.   Lab Results  Component Value Date   CREATININE 0.68 12/07/2020   BUN 10 12/07/2020   NA 140 12/07/2020   K 4.5 12/07/2020   CL 98 12/07/2020   CO2 23 12/07/2020   Lab Results  Component Value Date   ALT 18 12/07/2020   AST 13 12/07/2020   ALKPHOS 98 12/07/2020   BILITOT 0.2 12/07/2020   Lab Results  Component Value Date   HGBA1C 6.3 (H) 12/07/2020   HGBA1C 5.7 (H) 03/23/2020   HGBA1C 5.5 01/22/2020   HGBA1C 5.7 07/16/2019   HGBA1C 5.9 12/20/2017   Lab Results  Component Value Date   INSULIN 13.0 12/07/2020   INSULIN 8.7 03/23/2020   Lab Results  Component Value Date   TSH 4.790 (H) 12/07/2020   Lab Results  Component Value Date   CHOL 236 (H) 12/07/2020   HDL 56 12/07/2020   LDLCALC 160 (H) 12/07/2020   TRIG 112 12/07/2020   CHOLHDL 4.2 12/07/2020   Lab Results  Component Value Date   WBC 8.5 12/07/2020   HGB 12.7 12/07/2020   HCT 38.8 12/07/2020   MCV 85 12/07/2020   PLT 284 12/07/2020   Attestation Statements:   Reviewed by clinician on day of visit: allergies, medications, problem list, medical history, surgical history, family history, social history, and previous encounter notes.  I, Water quality scientist, CMA, am acting as Location manager for [employer], DO.  I have reviewed the above documentation for accuracy and completeness, and I agree with the above. Marjory Sneddon, D.O.  The [place] was signed into law in 2016 which includes the topic of electronic health records.  This provides immediate access to information in  MyChart.  This includes consultation notes, operative notes, office notes, lab results and pathology reports.  If you have any questions about what you read please let us know at your next visit so we can discuss your concerns and take corrective action if need be.  We are right here with you.

## 2021-01-27 ENCOUNTER — Other Ambulatory Visit (HOSPITAL_BASED_OUTPATIENT_CLINIC_OR_DEPARTMENT_OTHER): Payer: 59

## 2021-01-31 ENCOUNTER — Ambulatory Visit (INDEPENDENT_AMBULATORY_CARE_PROVIDER_SITE_OTHER): Payer: 59 | Admitting: Family Medicine

## 2021-01-31 ENCOUNTER — Other Ambulatory Visit: Payer: Self-pay

## 2021-01-31 ENCOUNTER — Encounter (INDEPENDENT_AMBULATORY_CARE_PROVIDER_SITE_OTHER): Payer: Self-pay | Admitting: Family Medicine

## 2021-01-31 VITALS — BP 109/64 | HR 64 | Temp 98.2°F | Ht 66.0 in | Wt 224.0 lb

## 2021-01-31 DIAGNOSIS — F430 Acute stress reaction: Secondary | ICD-10-CM | POA: Diagnosis not present

## 2021-01-31 DIAGNOSIS — Z9189 Other specified personal risk factors, not elsewhere classified: Secondary | ICD-10-CM

## 2021-01-31 DIAGNOSIS — Z6838 Body mass index (BMI) 38.0-38.9, adult: Secondary | ICD-10-CM | POA: Diagnosis not present

## 2021-01-31 MED ORDER — BUPROPION HCL ER (SR) 100 MG PO TB12: 100.0000 mg | ORAL_TABLET | Freq: Every morning | ORAL | 0 refills | Status: DC

## 2021-02-08 NOTE — Progress Notes (Signed)
Chief Complaint:   OBESITY Maria Glenn is here to discuss her progress with her obesity treatment plan along with follow-up of her obesity related diagnoses.   Today's visit was #: 5 Starting weight: 241 lbs Starting date: 12/07/2020 Today's weight: 224 lbs Today's date: 01/31/2021 Weight change since last visit: 0 Total lbs lost to date: 17 lbs Body mass index is 36.15 kg/m.  Total weight loss percentage to date: -7.05%  Interim History:  [name] has had no weight loss since her last office visit on 01/17/2021.  At her last office visit we asked her to start walking for 10 minutes 3 days per week.  She is now up to 35 minutes 3 days per week.  She just was not tracking at all.  Increased stress with her 53 year old son wanting to go out of town to college.  Current Meal Plan: keeping a food journal and adhering to recommended goals of 1600-1700 calories and 120 grams of protein for 10% of the time.  Current Exercise Plan: Walking on the treadmill for 35 minutes 3 times per week.  Assessment/Plan:   Medications Discontinued During This Encounter  Medication Reason  . montelukast (SINGULAIR) 10 MG tablet     Meds ordered this encounter  Medications  . buPROPion (WELLBUTRIN SR) 100 MG 12 hr tablet    Sig: Take 1 tablet (100 mg total) by mouth in the morning.    Dispense:  30 tablet    Refill:  0     1. Acute reaction to situational stress, with emotional eating Increased emotional eating with stress of son going off to college now.  No SI.  Plan:  Start Wellbutrin every morning.  After a long discussing with Collen about the risks and benefits of the medication, she wishes to hold off on counseling for now.  Increase activity.   - Start buPROPion (WELLBUTRIN SR) 100 MG 12 hr tablet; Take 1 tablet (100 mg total) by mouth in the morning.  Dispense: 30 tablet; Refill: 0  2. At risk for depression MELI FALEY was given approximately 12 minutes of depression prevention counseling  today due to their higher than average risk for this condition. The patient has several risk factors for depression such as chronic medical conditions, sleep issues, major life stressors/events, etc., and we discussed these today.  TINE MABEE was also counseled on the importance of a healthy work-life balance, a healthy relationship with food, and a good support system.  We discussed various strategies to help cope with these emotions as well.  I recommended counseling, meditation or prayer, healthy eating habits, sleep hygiene, and exercising to help manage these feelings.   3. Obesity, current BMI 36.2  Course: [name] is currently in the action stage of change. As such, her goal is to continue with weight loss efforts.   Nutrition goals: She has agreed to keeping a food journal and adhering to recommended goals of 1600-1700 calories and 120 grams of protein.   Exercise goals: Increase activity to 30 minutes 5 days per week.  Behavioral modification strategies: planning for success and keeping a strict food journal.  Mikisha has agreed to follow-up with our clinic in 2 weeks. She was informed of the importance of frequent follow-up visits to maximize her success with intensive lifestyle modifications for her multiple health conditions.   Objective:   Blood pressure 109/64, pulse 64, temperature 98.2 F (36.8 C), height 5\' 6"  (1.676 m), weight 224 lb (101.6 kg), SpO2 100 %.  Body mass index is 36.15 kg/m.  General: Cooperative, alert, well developed, in no acute distress. HEENT: Conjunctivae and lids unremarkable. Cardiovascular: Regular rhythm.  Lungs: Normal work of breathing. Neurologic: No focal deficits.   Lab Results  Component Value Date   CREATININE 0.68 12/07/2020   BUN 10 12/07/2020   NA 140 12/07/2020   K 4.5 12/07/2020   CL 98 12/07/2020   CO2 23 12/07/2020   Lab Results  Component Value Date   ALT 18 12/07/2020   AST 13 12/07/2020   ALKPHOS 98 12/07/2020    BILITOT 0.2 12/07/2020   Lab Results  Component Value Date   HGBA1C 6.3 (H) 12/07/2020   HGBA1C 5.7 (H) 03/23/2020   HGBA1C 5.5 01/22/2020   HGBA1C 5.7 07/16/2019   HGBA1C 5.9 12/20/2017   Lab Results  Component Value Date   INSULIN 13.0 12/07/2020   INSULIN 8.7 03/23/2020   Lab Results  Component Value Date   TSH 4.790 (H) 12/07/2020   Lab Results  Component Value Date   CHOL 236 (H) 12/07/2020   HDL 56 12/07/2020   LDLCALC 160 (H) 12/07/2020   TRIG 112 12/07/2020   CHOLHDL 4.2 12/07/2020   Lab Results  Component Value Date   WBC 8.5 12/07/2020   HGB 12.7 12/07/2020   HCT 38.8 12/07/2020   MCV 85 12/07/2020   PLT 284 12/07/2020   Attestation Statements:   Reviewed by clinician on day of visit: allergies, medications, problem list, medical history, surgical history, family history, social history, and previous encounter notes.  I, Water quality scientist, CMA, am acting as Location manager for [employer], DO.  I have reviewed the above documentation for accuracy and completeness, and I agree with the above. Marjory Sneddon, D.O.  The [place] was signed into law in 2016 which includes the topic of electronic health records.  This provides immediate access to information in MyChart.  This includes consultation notes, operative notes, office notes, lab results and pathology reports.  If you have any questions about what you read please let us know at your next visit so we can discuss your concerns and take corrective action if need be.  We are right here with you.

## 2021-02-12 ENCOUNTER — Other Ambulatory Visit (INDEPENDENT_AMBULATORY_CARE_PROVIDER_SITE_OTHER): Payer: Self-pay | Admitting: Family Medicine

## 2021-02-12 DIAGNOSIS — E039 Hypothyroidism, unspecified: Secondary | ICD-10-CM

## 2021-02-14 ENCOUNTER — Ambulatory Visit (INDEPENDENT_AMBULATORY_CARE_PROVIDER_SITE_OTHER): Payer: 59 | Admitting: Family Medicine

## 2021-02-14 ENCOUNTER — Encounter (INDEPENDENT_AMBULATORY_CARE_PROVIDER_SITE_OTHER): Payer: Self-pay | Admitting: Family Medicine

## 2021-02-14 ENCOUNTER — Other Ambulatory Visit: Payer: Self-pay

## 2021-02-14 VITALS — BP 119/72 | HR 66 | Temp 98.5°F | Ht 66.0 in | Wt 223.0 lb

## 2021-02-14 DIAGNOSIS — F430 Acute stress reaction: Secondary | ICD-10-CM | POA: Insufficient documentation

## 2021-02-14 DIAGNOSIS — E039 Hypothyroidism, unspecified: Secondary | ICD-10-CM | POA: Diagnosis not present

## 2021-02-14 DIAGNOSIS — Z9189 Other specified personal risk factors, not elsewhere classified: Secondary | ICD-10-CM

## 2021-02-14 DIAGNOSIS — Z6838 Body mass index (BMI) 38.0-38.9, adult: Secondary | ICD-10-CM | POA: Diagnosis not present

## 2021-02-14 HISTORY — DX: Other specified personal risk factors, not elsewhere classified: Z91.89

## 2021-02-14 MED ORDER — LEVOTHYROXINE SODIUM 88 MCG PO TABS: 88.0000 ug | ORAL_TABLET | Freq: Every day | ORAL | 0 refills | Status: DC

## 2021-02-14 MED ORDER — BUPROPION HCL ER (SR) 100 MG PO TB12: 100.0000 mg | ORAL_TABLET | Freq: Every morning | ORAL | 0 refills | Status: DC

## 2021-02-14 NOTE — Telephone Encounter (Signed)
Pt last seen by Dr. Opalski.  

## 2021-02-16 NOTE — Progress Notes (Signed)
Chief Complaint:   OBESITY Maria Glenn is here to discuss her progress with her obesity treatment plan along with follow-up of her obesity related diagnoses.   Today's visit was #: 6 Starting weight: 241 lbs Starting date: 12/07/2020 Today's weight: 223 lbs Today's date: 02/14/2021 Weight change since last visit: 1 lb Total lbs lost to date: 18 lbs Body mass index is 35.99 kg/m.  Total weight loss percentage to date: -7.47%  Interim History:  Maria Glenn started back at the gym recently 5 days per week for 1 hour classes.  Still skipping lunch.  Eating 1200 calories per day on average.  Not hitting all protein goals most days.  Current Meal Plan: keeping a food journal and adhering to recommended goals of 1600-1700 calories and 120 protein for 10% of the time.  Current Exercise Plan:  Treadmill and going to the gym for 75 minutes 3-5 days per week.  Assessment/Plan:   Medications Discontinued During This Encounter  Medication Reason  . levothyroxine (SYNTHROID) 88 MCG tablet Reorder  . buPROPion (WELLBUTRIN SR) 100 MG 12 hr tablet Reorder    Meds ordered this encounter  Medications  . buPROPion (WELLBUTRIN SR) 100 MG 12 hr tablet    Sig: Take 1 tablet (100 mg total) by mouth in the morning.    Dispense:  30 tablet    Refill:  0  . levothyroxine (SYNTHROID) 88 MCG tablet    Sig: Take 1 tablet (88 mcg total) by mouth daily. Brand only    Dispense:  30 tablet    Refill:  0    Brand ONLY. Pt Needs OV for RF    1. Acquired hypothyroidism Medication: Synthroid 88 mcg daily.   Plan: Patient was instructed not to take MVM or iron within 4 hours of taking thyroid medications.  We will continue to monitor alongside Endocrinology/PCP as it relates to her weight loss journey.   Lab Results  Component Value Date   TSH 4.790 (H) 12/07/2020   - Refill levothyroxine (SYNTHROID) 88 MCG tablet; Take 1 tablet (88 mcg total) by mouth daily. Brand only  Dispense: 30 tablet; Refill: 0  2.  Acute reaction to situational stress, with emotional eating Started Wellbutrin at last office visit.  Tolerating well.  Has had improved emotional eating lately.  Less stress in life now.  Going to the gym as well.  Plan:  Will refill Wellbutrin today, as per below.  - Refill buPROPion (WELLBUTRIN SR) 100 MG 12 hr tablet; Take 1 tablet (100 mg total) by mouth in the morning.  Dispense: 30 tablet; Refill: 0  3. At risk for dehydration Maria Glenn is at higher than average risk of dehydration.  Maria Glenn was given more than 9 minutes of proper hydration counseling today.  We discussed the signs and symptoms of dehydration, some of which may include muscle cramping, constipation or even orthostatic symptoms.  Counseling on the prevention of dehydration was also provided today.  Maria Glenn is at risk for dehydration due to weight loss, lifestyle and behavorial habits and possibly due to taking certain medication(s).  She was encouraged to adequately hydrate and monitor fluid status to avoid dehydration as well as weight loss plateaus.  Unless pre-existing renal or cardiopulmonary conditions exist, in which patient was told to limit their fluid intake, I recommended roughly one half of their weight in pounds to be the approximate ounces of non-caloric, non-caffeinated beverages they should drink per day; including more if they are engaging in exercise.  4. Obesity,  current BMI 36.0  Course: Maria Glenn is currently in the action stage of change. As such, her goal is to continue with weight loss efforts.   Nutrition goals: She has agreed to keeping a food journal and adhering to recommended goals of 1600-1700 calories and 120 grams of protein.   Exercise goals: As is.  Behavioral modification strategies: increasing lean protein intake, decreasing simple carbohydrates, meal planning and cooking strategies and planning for success.  Maria Glenn has agreed to follow-up with our clinic in 2-3 weeks. She was informed of the  importance of frequent follow-up visits to maximize her success with intensive lifestyle modifications for her multiple health conditions.   Objective:   Blood pressure 119/72, pulse 66, temperature 98.5 F (36.9 C), height 5\' 6"  (1.676 m), weight 223 lb (101.2 kg), SpO2 98 %. Body mass index is 35.99 kg/m.  General: Cooperative, alert, well developed, in no acute distress. HEENT: Conjunctivae and lids unremarkable. Cardiovascular: Regular rhythm.  Lungs: Normal work of breathing. Neurologic: No focal deficits.   Lab Results  Component Value Date   CREATININE 0.68 12/07/2020   BUN 10 12/07/2020   NA 140 12/07/2020   K 4.5 12/07/2020   CL 98 12/07/2020   CO2 23 12/07/2020   Lab Results  Component Value Date   ALT 18 12/07/2020   AST 13 12/07/2020   ALKPHOS 98 12/07/2020   BILITOT 0.2 12/07/2020   Lab Results  Component Value Date   HGBA1C 6.3 (H) 12/07/2020   HGBA1C 5.7 (H) 03/23/2020   HGBA1C 5.5 01/22/2020   HGBA1C 5.7 07/16/2019   HGBA1C 5.9 12/20/2017   Lab Results  Component Value Date   INSULIN 13.0 12/07/2020   INSULIN 8.7 03/23/2020   Lab Results  Component Value Date   TSH 4.790 (H) 12/07/2020   Lab Results  Component Value Date   CHOL 236 (H) 12/07/2020   HDL 56 12/07/2020   LDLCALC 160 (H) 12/07/2020   TRIG 112 12/07/2020   CHOLHDL 4.2 12/07/2020   Lab Results  Component Value Date   WBC 8.5 12/07/2020   HGB 12.7 12/07/2020   HCT 38.8 12/07/2020   MCV 85 12/07/2020   PLT 284 12/07/2020   Attestation Statements:   Reviewed by clinician on day of visit: allergies, medications, problem list, medical history, surgical history, family history, social history, and previous encounter notes.  I, Water quality scientist, CMA, am acting as Location manager for [employer], DO.  I have reviewed the above documentation for accuracy and completeness, and I agree with the above. Maria Glenn, D.O.  The [place] was signed into law in  2016 which includes the topic of electronic health records.  This provides immediate access to information in MyChart.  This includes consultation notes, operative notes, office notes, lab results and pathology reports.  If you have any questions about what you read please let us know at your next visit so we can discuss your concerns and take corrective action if need be.  We are right here with you.

## 2021-02-28 ENCOUNTER — Ambulatory Visit (INDEPENDENT_AMBULATORY_CARE_PROVIDER_SITE_OTHER): Payer: 59 | Admitting: Family Medicine

## 2021-03-01 ENCOUNTER — Ambulatory Visit (INDEPENDENT_AMBULATORY_CARE_PROVIDER_SITE_OTHER): Payer: 59 | Admitting: Family Medicine

## 2021-03-01 ENCOUNTER — Other Ambulatory Visit: Payer: Self-pay

## 2021-03-01 ENCOUNTER — Encounter (INDEPENDENT_AMBULATORY_CARE_PROVIDER_SITE_OTHER): Payer: Self-pay | Admitting: Family Medicine

## 2021-03-01 VITALS — BP 107/73 | HR 70 | Temp 98.3°F | Ht 66.0 in | Wt 221.0 lb

## 2021-03-01 DIAGNOSIS — Z6838 Body mass index (BMI) 38.0-38.9, adult: Secondary | ICD-10-CM

## 2021-03-01 DIAGNOSIS — Z9189 Other specified personal risk factors, not elsewhere classified: Secondary | ICD-10-CM | POA: Diagnosis not present

## 2021-03-01 DIAGNOSIS — E559 Vitamin D deficiency, unspecified: Secondary | ICD-10-CM

## 2021-03-01 DIAGNOSIS — E038 Other specified hypothyroidism: Secondary | ICD-10-CM

## 2021-03-09 NOTE — Progress Notes (Signed)
Chief Complaint:   OBESITY Maria Glenn is here to discuss her progress with her obesity treatment plan along with follow-up of her obesity related diagnoses.   Today's visit was #: 7 Starting weight: 241 lbs Starting date: 12/07/2020 Today's weight: 221 lbs Today's date: 03/01/2021 Weight change since last visit: 2 lbs Total lbs lost to date: 20 lbs Body mass index is 35.67 kg/m.  Total weight loss percentage to date: -8.30%  Interim History:  Tayva says she has not been following the plan much in the past 2 weeks.  She went to the gym 3 days per week when she wanted/planned on going 5 days per week.  She is still not getting her water in at all, especially on workout days.  Plan:  Increase water intake - has new water jug.  Focus also on not skipping lunch/meals.  Current Meal Plan: keeping a food journal and adhering to recommended goals of 1600-1700 calories and 120 grams of protein for 20% of the time.  Current Exercise Plan: Going to the gym for 60 minutes 3 times per week.  Assessment/Plan:   1. Vitamin D deficiency Not at goal. Current vitamin D is 24.5, tested on 12/07/2020. Optimal goal > 50 ng/dL.  She is taking OTC vitamin D 5,000 IU daily.  No issues.  Plan: Continue current OTC vitamin D supplementation.  Follow-up for routine testing of Vitamin D, at least 2-3 times per year to avoid over-replacement.  2. Other specified hypothyroidism Medication: levothyroxine 88 mcg daily.  No concerns or issues.  Plan: Patient was instructed not to take MVM or iron within 4 hours of taking thyroid medications. We will continue to monitor alongside Endocrinology/PCP as it relates to her weight loss journey.   Lab Results  Component Value Date   TSH 4.790 (H) 12/07/2020   3. At risk for dehydration Naveah is at higher than average risk of dehydration.  Lynsi was given more than 8 minutes of proper hydration counseling today.  We discussed the signs and symptoms of dehydration, some  of which may include muscle cramping, constipation or even orthostatic symptoms.  Counseling on the prevention of dehydration was also provided today.  Milah is at risk for dehydration due to weight loss, lifestyle and behavorial habits and possibly due to taking certain medication(s).  She was encouraged to adequately hydrate and monitor fluid status to avoid dehydration as well as weight loss plateaus.  Unless pre-existing renal or cardiopulmonary conditions exist, in which patient was told to limit their fluid intake, I recommended roughly one half of their weight in pounds to be the approximate ounces of non-caloric, non-caffeinated beverages they should drink per day; including more if they are engaging in exercise.  4. Obesity, current BMI 35.7  Course: Devory is currently in the action stage of change. As such, her goal is to continue with weight loss efforts.   Nutrition goals: She has agreed to keeping a food journal and adhering to recommended goals of 1600-1700 calories and 120 grams of protein.   Exercise goals:  Exercise for a minimum of 30-60 minutes 5 days per week.  Behavioral modification strategies: increasing lean protein intake, increasing water intake, planning for success, and keeping a strict food journal.  Tanveer has agreed to follow-up with our clinic in 2 weeks. She was informed of the importance of frequent follow-up visits to maximize her success with intensive lifestyle modifications for her multiple health conditions.   Objective:   Blood pressure 107/73, pulse  70, temperature 98.3 F (36.8 C), height 5\' 6"  (1.676 m), weight 221 lb (100.2 kg), SpO2 98 %. Body mass index is 35.67 kg/m.  General: Cooperative, alert, well developed, in no acute distress. HEENT: Conjunctivae and lids unremarkable. Cardiovascular: Regular rhythm.  Lungs: Normal work of breathing. Neurologic: No focal deficits.   Lab Results  Component Value Date   CREATININE 0.68 12/07/2020   BUN  10 12/07/2020   NA 140 12/07/2020   K 4.5 12/07/2020   CL 98 12/07/2020   CO2 23 12/07/2020   Lab Results  Component Value Date   ALT 18 12/07/2020   AST 13 12/07/2020   ALKPHOS 98 12/07/2020   BILITOT 0.2 12/07/2020   Lab Results  Component Value Date   HGBA1C 6.3 (H) 12/07/2020   HGBA1C 5.7 (H) 03/23/2020   HGBA1C 5.5 01/22/2020   HGBA1C 5.7 07/16/2019   HGBA1C 5.9 12/20/2017   Lab Results  Component Value Date   INSULIN 13.0 12/07/2020   INSULIN 8.7 03/23/2020   Lab Results  Component Value Date   TSH 4.790 (H) 12/07/2020   Lab Results  Component Value Date   CHOL 236 (H) 12/07/2020   HDL 56 12/07/2020   LDLCALC 160 (H) 12/07/2020   TRIG 112 12/07/2020   CHOLHDL 4.2 12/07/2020   Lab Results  Component Value Date   WBC 8.5 12/07/2020   HGB 12.7 12/07/2020   HCT 38.8 12/07/2020   MCV 85 12/07/2020   PLT 284 12/07/2020   Attestation Statements:   Reviewed by clinician on day of visit: allergies, medications, problem list, medical history, surgical history, family history, social history, and previous encounter notes.  I, Water quality scientist, CMA, am acting as Location manager for [employer], DO.  I have reviewed the above documentation for accuracy and completeness, and I agree with the above. Marjory Sneddon, D.O.  The [place] was signed into law in 2016 which includes the topic of electronic health records.  This provides immediate access to information in MyChart.  This includes consultation notes, operative notes, office notes, lab results and pathology reports.  If you have any questions about what you read please let us know at your next visit so we can discuss your concerns and take corrective action if need be.  We are right here with you.

## 2021-03-14 ENCOUNTER — Ambulatory Visit (INDEPENDENT_AMBULATORY_CARE_PROVIDER_SITE_OTHER): Payer: 59 | Admitting: Family Medicine

## 2021-03-14 ENCOUNTER — Other Ambulatory Visit: Payer: Self-pay

## 2021-03-14 VITALS — BP 126/66 | HR 73 | Temp 97.9°F | Ht 66.0 in | Wt 223.0 lb

## 2021-03-14 DIAGNOSIS — E559 Vitamin D deficiency, unspecified: Secondary | ICD-10-CM | POA: Diagnosis not present

## 2021-03-14 DIAGNOSIS — F439 Reaction to severe stress, unspecified: Secondary | ICD-10-CM

## 2021-03-14 DIAGNOSIS — E669 Obesity, unspecified: Secondary | ICD-10-CM

## 2021-03-14 DIAGNOSIS — R7303 Prediabetes: Secondary | ICD-10-CM

## 2021-03-14 DIAGNOSIS — E038 Other specified hypothyroidism: Secondary | ICD-10-CM | POA: Diagnosis not present

## 2021-03-14 DIAGNOSIS — Z6834 Body mass index (BMI) 34.0-34.9, adult: Secondary | ICD-10-CM

## 2021-03-14 DIAGNOSIS — Z9189 Other specified personal risk factors, not elsewhere classified: Secondary | ICD-10-CM | POA: Diagnosis not present

## 2021-03-14 MED ORDER — LEVOTHYROXINE SODIUM 88 MCG PO TABS: 88.0000 ug | ORAL_TABLET | Freq: Every day | ORAL | 0 refills | Status: DC

## 2021-03-14 MED ORDER — BUPROPION HCL ER (XL) 150 MG PO TB24: 150.0000 mg | ORAL_TABLET | Freq: Every day | ORAL | 0 refills | Status: DC

## 2021-03-14 NOTE — Patient Instructions (Signed)
The 10-year ASCVD risk score Mikey Bussing DC Brooke Bonito., et al., 2013) is: 1.9%   Values used to calculate the score:     Age: 55 years     Sex: Female     Is Non-Hispanic African American: No     Diabetic: No     Tobacco smoker: No     Systolic Blood Pressure: 606 mmHg     Is BP treated: No     HDL Cholesterol: 56 mg/dL     Total Cholesterol: 236 mg/dL

## 2021-03-15 LAB — HEMOGLOBIN A1C
Est. average glucose Bld gHb Est-mCnc: 126 mg/dL
Hgb A1c MFr Bld: 6 % — ABNORMAL HIGH (ref 4.8–5.6)

## 2021-03-15 LAB — VITAMIN D 25 HYDROXY (VIT D DEFICIENCY, FRACTURES): Vit D, 25-Hydroxy: 34.1 ng/mL (ref 30.0–100.0)

## 2021-03-15 LAB — T4, FREE: Free T4: 1.3 ng/dL (ref 0.82–1.77)

## 2021-03-15 LAB — TSH: TSH: 2.92 u[IU]/mL (ref 0.450–4.500)

## 2021-03-15 LAB — INSULIN, RANDOM: INSULIN: 8.3 u[IU]/mL (ref 2.6–24.9)

## 2021-03-21 NOTE — Progress Notes (Signed)
Chief Complaint:   OBESITY Maria Glenn is here to discuss her progress with her obesity treatment plan along with follow-up of her obesity related diagnoses.   Today's visit was #: 8 Starting weight: 241 lbs Starting date: 12/07/2020 Today's weight: 223 lbs Today's date: 03/14/2021 Weight change since last visit: +2 lbs Total lbs lost to date: 18 lbs Body mass index is 35.99 kg/m.  Total weight loss percentage to date: -7.47%  Interim History:  [name] says she has difficulty getting in all the water she needs to get in (60 ounces per day).  She also had a lot of pastries recently, which increased her weight today.  Plan:  no skipping meals/meal planning/journal.  Current Meal Plan: keeping a food journal and adhering to recommended goals of 1600-1700 calories and 100 grams of protein for 30% of the time.  Current Exercise Plan: Cardio and weights for 60 minutes 3-4 times per week.  Assessment/Plan:   Orders Placed This Encounter  Procedures   Hemoglobin A1c   Insulin, random   VITAMIN D 25 Hydroxy (Vit-D Deficiency, Fractures)   TSH   T4, free   Medications Discontinued During This Encounter  Medication Reason   buPROPion (WELLBUTRIN SR) 100 MG 12 hr tablet Change in therapy   levothyroxine (SYNTHROID) 88 MCG tablet Reorder   Meds ordered this encounter  Medications   levothyroxine (SYNTHROID) 88 MCG tablet    Sig: Take 1 tablet (88 mcg total) by mouth daily. Brand only    Dispense:  30 tablet    Refill:  0    Brand ONLY. Pt Needs OV for RF   buPROPion (WELLBUTRIN XL) 150 MG 24 hr tablet    Sig: Take 1 tablet (150 mg total) by mouth daily.    Dispense:  30 tablet    Refill:  0    1. Other specified hypothyroidism Course: Controlled. Medication: levothyroxine 88 mcg daily.   Plan: Patient was instructed not to take MVM or iron within 4 hours of taking thyroid medications. We will continue to monitor alongside Endocrinology/PCP as it relates to her weight loss  journey.  Will check TSH and free T4 today.  Refill Synthroid, as per below.  Lab Results  Component Value Date   TSH 2.920 03/14/2021   - Refill levothyroxine (SYNTHROID) 88 MCG tablet; Take 1 tablet (88 mcg total) by mouth daily. Brand only  Dispense: 30 tablet; Refill: 0 - TSH - T4, free  2. Prediabetes Not at goal. Goal is HgbA1c < 5.7.  Medication: None.  A1c 6.3 at last check.  Elevated fasting insulin.    Plan:  She will continue to focus on protein-rich, low simple carbohydrate foods. We reviewed the importance of hydration, regular exercise for stress reduction, and restorative sleep.  Will check A1c and fasting insulin level today.  Declines medication.  Continue prudent nutritional plan and weight loss.  Lab Results  Component Value Date   HGBA1C 6.0 (H) 03/14/2021   Lab Results  Component Value Date   INSULIN 8.3 03/14/2021   INSULIN 13.0 12/07/2020   INSULIN 8.7 03/23/2020   - Hemoglobin A1c - Insulin, random  3. Vitamin D deficiency Not at goal. Current vitamin D is 24.5, tested on 12/07/2020. Optimal goal > 50 ng/dL.  She has been on OTC vitamin D 5,000 IU daily since last labs.  Plan: Continue current OTC vitamin D supplementation for now.  Will adjust dose as needed.  Will check vitamin D level today, as per below.  -  VITAMIN D 25 Hydroxy (Vit-D Deficiency, Fractures)  4. Situational stress, with emotional eating She feels like Wellbutrin has lessened in efficacy and she is inquiring about a higher dose.  She has had increased stress in her life recently.  Plan:  increase dose of Wellbutrin from 100 SR daily to Wellbutrin 150 mg XL daily.  Self-care practices discussed with her today.   - Increase and refill buPROPion (WELLBUTRIN XL) 150 MG 24 hr tablet; Take 1 tablet (150 mg total) by mouth daily.  Dispense: 30 tablet; Refill: 0  5. At risk for depression Maria Glenn was given approximately 22 minutes of depression prevention counseling today due to  their higher than average risk for this condition. The patient has several risk factors for depression such as chronic medical conditions, sleep issues, major life stressors/events, etc., and we discussed these today.  Maria Glenn was also counseled on the importance of a healthy work-life balance, a healthy relationship with food, and a good support system.  We discussed various strategies to help cope with these emotions as well.  I recommended counseling, meditation or prayer, healthy eating habits, sleep hygiene, and exercising to help manage these feelings.   6. Class 1 obesity with serious comorbidity and body mass index (BMI) of 34.0 to 34.9 in adult, unspecified obesity type  Course: [name] is currently in the action stage of change. As such, her goal is to continue with weight loss efforts.   Nutrition goals: She has agreed to keeping a food journal and adhering to recommended goals of 1600-1800 calories and 100 grams of protein.   Exercise goals:  As is.  Behavioral modification strategies: increasing water intake, no skipping meals, and keeping a strict food journal.  Samaiyah has agreed to follow-up with our clinic in 3 weeks. She was informed of the importance of frequent follow-up visits to maximize her success with intensive lifestyle modifications for her multiple health conditions.   Maria Glenn was informed we would discuss her lab results at her next visit unless there is a critical issue that needs to be addressed sooner. Maria Glenn agreed to keep her next visit at the agreed upon time to discuss these results.  Objective:   Blood pressure 126/66, pulse 73, temperature 97.9 F (36.6 C), height 5\' 6"  (1.676 m), weight 223 lb (101.2 kg), SpO2 97 %. Body mass index is 35.99 kg/m.  General: Cooperative, alert, well developed, in no acute distress. HEENT: Conjunctivae and lids unremarkable. Cardiovascular: Regular rhythm.  Lungs: Normal work of breathing. Neurologic: No focal deficits.    Lab Results  Component Value Date   CREATININE 0.68 12/07/2020   BUN 10 12/07/2020   NA 140 12/07/2020   K 4.5 12/07/2020   CL 98 12/07/2020   CO2 23 12/07/2020   Lab Results  Component Value Date   ALT 18 12/07/2020   AST 13 12/07/2020   ALKPHOS 98 12/07/2020   BILITOT 0.2 12/07/2020   Lab Results  Component Value Date   HGBA1C 6.0 (H) 03/14/2021   HGBA1C 6.3 (H) 12/07/2020   HGBA1C 5.7 (H) 03/23/2020   HGBA1C 5.5 01/22/2020   HGBA1C 5.7 07/16/2019   Lab Results  Component Value Date   INSULIN 8.3 03/14/2021   INSULIN 13.0 12/07/2020   INSULIN 8.7 03/23/2020   Lab Results  Component Value Date   TSH 2.920 03/14/2021   Lab Results  Component Value Date   CHOL 236 (H) 12/07/2020   HDL 56 12/07/2020   LDLCALC 160 (H) 12/07/2020  TRIG 112 12/07/2020   CHOLHDL 4.2 12/07/2020   Lab Results  Component Value Date   WBC 8.5 12/07/2020   HGB 12.7 12/07/2020   HCT 38.8 12/07/2020   MCV 85 12/07/2020   PLT 284 12/07/2020   Attestation Statements:   Reviewed by clinician on day of visit: allergies, medications, problem list, medical history, surgical history, family history, social history, and previous encounter notes.  I, Water quality scientist, CMA, am acting as Location manager for [employer], DO.  I have reviewed the above documentation for accuracy and completeness, and I agree with the above. Marjory Sneddon, D.O.  The [place] was signed into law in 2016 which includes the topic of electronic health records.  This provides immediate access to information in MyChart.  This includes consultation notes, operative notes, office notes, lab results and pathology reports.  If you have any questions about what you read please let us know at your next visit so we can discuss your concerns and take corrective action if need be.  We are right here with you.

## 2021-03-30 ENCOUNTER — Ambulatory Visit (INDEPENDENT_AMBULATORY_CARE_PROVIDER_SITE_OTHER): Payer: 59 | Admitting: Family Medicine

## 2021-04-06 ENCOUNTER — Other Ambulatory Visit: Payer: Self-pay

## 2021-04-06 ENCOUNTER — Ambulatory Visit (INDEPENDENT_AMBULATORY_CARE_PROVIDER_SITE_OTHER): Payer: 59 | Admitting: Family Medicine

## 2021-04-06 VITALS — BP 123/83 | HR 78 | Temp 97.8°F | Ht 66.0 in | Wt 224.0 lb

## 2021-04-06 DIAGNOSIS — E038 Other specified hypothyroidism: Secondary | ICD-10-CM

## 2021-04-06 DIAGNOSIS — F439 Reaction to severe stress, unspecified: Secondary | ICD-10-CM

## 2021-04-06 DIAGNOSIS — E559 Vitamin D deficiency, unspecified: Secondary | ICD-10-CM | POA: Diagnosis not present

## 2021-04-06 DIAGNOSIS — R7303 Prediabetes: Secondary | ICD-10-CM | POA: Diagnosis not present

## 2021-04-06 DIAGNOSIS — E669 Obesity, unspecified: Secondary | ICD-10-CM

## 2021-04-06 DIAGNOSIS — Z9189 Other specified personal risk factors, not elsewhere classified: Secondary | ICD-10-CM | POA: Diagnosis not present

## 2021-04-06 DIAGNOSIS — Z6834 Body mass index (BMI) 34.0-34.9, adult: Secondary | ICD-10-CM

## 2021-04-06 MED ORDER — BUPROPION HCL ER (XL) 150 MG PO TB24: 150.0000 mg | ORAL_TABLET | Freq: Every day | ORAL | 0 refills | Status: DC

## 2021-04-06 MED ORDER — CHOLECALCIFEROL 1.25 MG (50000 UT) PO CAPS: ORAL_CAPSULE | ORAL | 0 refills | Status: DC

## 2021-04-06 MED ORDER — LEVOTHYROXINE SODIUM 88 MCG PO TABS: 88.0000 ug | ORAL_TABLET | Freq: Every day | ORAL | 0 refills | Status: DC

## 2021-04-19 NOTE — Progress Notes (Signed)
Chief Complaint:   OBESITY Maria Glenn is here to discuss her progress with her obesity treatment plan along with follow-up of her obesity related diagnoses.   Today's visit was #: 9 Starting weight: 241 lbs Starting date: 12/07/2020 Today's weight: 224 lbs Today's date: 04/06/2021 Weight change since last visit: +1 lbs Total lbs lost to date: 17 lbs Body mass index is 36.15 kg/m.  Total weight loss percentage to date: -7.05%  Interim History:  Ceriah got off track and ate what she wanted, "and I also overage".  Not journaling.  She is here to review her labs that were obtained at the last office visit.  Current Meal Plan: keeping a food journal and adhering to recommended goals of 1600-1800 calories and 100 grams of protein for 0% of the time.  Current Exercise Plan: None.  Assessment/Plan:   Medications Discontinued During This Encounter  Medication Reason   Cholecalciferol (VITAMIN D3) 125 MCG (5000 UT) CAPS    levothyroxine (SYNTHROID) 88 MCG tablet Reorder   buPROPion (WELLBUTRIN XL) 150 MG 24 hr tablet Reorder   Meds ordered this encounter  Medications   buPROPion (WELLBUTRIN XL) 150 MG 24 hr tablet    Sig: Take 1 tablet (150 mg total) by mouth daily.    Dispense:  30 tablet    Refill:  0   levothyroxine (SYNTHROID) 88 MCG tablet    Sig: Take 1 tablet (88 mcg total) by mouth daily. Brand only    Dispense:  90 tablet    Refill:  0    Brand ONLY. Pt Needs OV for RF   Cholecalciferol 1.25 MG (50000 UT) capsule    Sig: 1 po q wkly    Dispense:  4 capsule    Refill:  0    1. Prediabetes Not at goal. Goal is HgbA1c < 5.7.  Medication: None.  A1c improved from prior.  Plan:  Discussed labs with patient today.  She will continue to focus on protein-rich, low simple carbohydrate foods. We reviewed the importance of hydration, regular exercise for stress reduction, and restorative sleep.  Discussed A1c with her and that metformin and Ozempic are available but she wishes to  focus on meal plan at this time.  Lab Results  Component Value Date   HGBA1C 6.0 (H) 03/14/2021   Lab Results  Component Value Date   INSULIN 8.3 03/14/2021   INSULIN 13.0 12/07/2020   INSULIN 8.7 03/23/2020   2. Other specified hypothyroidism Course: controlled. Medication: levothyroxine 88 mcg daily.    Plan: Discussed labs with patient today.  Within normal limits.  Patient was instructed not to take MVM or iron within 4 hours of taking thyroid medications. We will continue to monitor alongside Endocrinology/PCP as it relates to her weight loss journey.    Lab Results  Component Value Date   TSH 2.920 03/14/2021   - Refill levothyroxine (SYNTHROID) 88 MCG tablet; Take 1 tablet (88 mcg total) by mouth daily. Brand only  Dispense: 90 tablet; Refill: 0  3. Vitamin D deficiency Not at goal.  She is taking vitamin D 50,000 IU weekly.  Plan:  Discussed labs with patient today.  Continue to take prescription Vitamin D '@50'$ ,000 IU every week as prescribed.  Follow-up for routine testing of Vitamin D, at least 2-3 times per year to avoid over-replacement.  Lab Results  Component Value Date   VD25OH 34.1 03/14/2021   VD25OH 24.5 (L) 12/07/2020   VD25OH 27.0 (L) 03/23/2020   - Refill  Cholecalciferol 1.25 MG (50000 UT) capsule; 1 po q wkly  Dispense: 4 capsule; Refill: 0  4. Situational stress, with emotional eating Controlled. Medication: Wellbutrin XL 150 mg daily.  Tolerating well.  Helping with mood.   Plan:  Refill Wellbutrin with no dose change.   - Refill buPROPion (WELLBUTRIN XL) 150 MG 24 hr tablet; Take 1 tablet (150 mg total) by mouth daily.  Dispense: 30 tablet; Refill: 0  5. At risk for diabetes mellitus - Maria Glenn was given diabetes prevention education and counseling today of more than 22 minutes.  - Counseled patient on pathophysiology of disease and meaning/ implication of lab results.  - Reviewed how certain foods can either stimulate or inhibit insulin release, and  subsequently affect hunger pathways  - Importance of following a healthy meal plan with limiting amounts of simple carbohydrates discussed with patient - Effects of regular aerobic exercise on blood sugar regulation reviewed and encouraged an eventual goal of 30 min 5d/week or more as a minimum.  - Briefly discussed treatment options, which always include dietary and lifestyle modification as first line.   - Handouts provided at patient's desire and/or told to go online to the American Diabetes Association website for further information.  6. Class 1 obesity with serious comorbidity and body mass index (BMI) of 34.0 to 34.9 in adult, unspecified obesity type  Course: Maria Glenn is currently in the action stage of change. As such, her goal is to continue with weight loss efforts.   Nutrition goals: She has agreed to keeping a food journal and adhering to recommended goals of 1600-1800 calories and 100 grams of protein.   Exercise goals: All adults should avoid inactivity. Some physical activity is better than none, and adults who participate in any amount of physical activity gain some health benefits.  Behavioral modification strategies: increasing lean protein intake, decreasing simple carbohydrates, and keeping healthy foods in the home.  Maria Glenn has agreed to follow-up with our clinic in 2-3 weeks. She was informed of the importance of frequent follow-up visits to maximize her success with intensive lifestyle modifications for her multiple health conditions.   Objective:   Blood pressure 123/83, pulse 78, temperature 97.8 F (36.6 C), height '5\' 6"'$  (1.676 m), weight 224 lb (101.6 kg), SpO2 97 %. Body mass index is 36.15 kg/m.  General: Cooperative, alert, well developed, in no acute distress. HEENT: Conjunctivae and lids unremarkable. Cardiovascular: Regular rhythm.  Lungs: Normal work of breathing. Neurologic: No focal deficits.   Lab Results  Component Value Date   CREATININE 0.68  12/07/2020   BUN 10 12/07/2020   NA 140 12/07/2020   K 4.5 12/07/2020   CL 98 12/07/2020   CO2 23 12/07/2020   Lab Results  Component Value Date   ALT 18 12/07/2020   AST 13 12/07/2020   ALKPHOS 98 12/07/2020   BILITOT 0.2 12/07/2020   Lab Results  Component Value Date   HGBA1C 6.0 (H) 03/14/2021   HGBA1C 6.3 (H) 12/07/2020   HGBA1C 5.7 (H) 03/23/2020   HGBA1C 5.5 01/22/2020   HGBA1C 5.7 07/16/2019   Lab Results  Component Value Date   INSULIN 8.3 03/14/2021   INSULIN 13.0 12/07/2020   INSULIN 8.7 03/23/2020   Lab Results  Component Value Date   TSH 2.920 03/14/2021   Lab Results  Component Value Date   CHOL 236 (H) 12/07/2020   HDL 56 12/07/2020   LDLCALC 160 (H) 12/07/2020   TRIG 112 12/07/2020   CHOLHDL 4.2 12/07/2020  Lab Results  Component Value Date   VD25OH 34.1 03/14/2021   VD25OH 24.5 (L) 12/07/2020   VD25OH 27.0 (L) 03/23/2020   Lab Results  Component Value Date   WBC 8.5 12/07/2020   HGB 12.7 12/07/2020   HCT 38.8 12/07/2020   MCV 85 12/07/2020   PLT 284 12/07/2020   Attestation Statements:   Reviewed by clinician on day of visit: allergies, medications, problem list, medical history, surgical history, family history, social history, and previous encounter notes.  I, Water quality scientist, CMA, am acting as Location manager for [employer], DO.  I have reviewed the above documentation for accuracy and completeness, and I agree with the above. Marjory Sneddon, D.O.  The [place] was signed into law in 2016 which includes the topic of electronic health records.  This provides immediate access to information in MyChart.  This includes consultation notes, operative notes, office notes, lab results and pathology reports.  If you have any questions about what you read please let us know at your next visit so we can discuss your concerns and take corrective action if need be.  We are right here with you.

## 2021-04-20 ENCOUNTER — Encounter (INDEPENDENT_AMBULATORY_CARE_PROVIDER_SITE_OTHER): Payer: Self-pay | Admitting: Family Medicine

## 2021-04-20 ENCOUNTER — Other Ambulatory Visit: Payer: Self-pay

## 2021-04-20 ENCOUNTER — Ambulatory Visit (INDEPENDENT_AMBULATORY_CARE_PROVIDER_SITE_OTHER): Payer: 59 | Admitting: Family Medicine

## 2021-04-20 VITALS — BP 105/64 | HR 87 | Temp 98.0°F | Ht 66.0 in | Wt 219.0 lb

## 2021-04-20 DIAGNOSIS — E559 Vitamin D deficiency, unspecified: Secondary | ICD-10-CM | POA: Diagnosis not present

## 2021-04-20 DIAGNOSIS — F439 Reaction to severe stress, unspecified: Secondary | ICD-10-CM | POA: Diagnosis not present

## 2021-04-20 DIAGNOSIS — Z6834 Body mass index (BMI) 34.0-34.9, adult: Secondary | ICD-10-CM

## 2021-04-20 DIAGNOSIS — E669 Obesity, unspecified: Secondary | ICD-10-CM | POA: Diagnosis not present

## 2021-04-20 DIAGNOSIS — Z9189 Other specified personal risk factors, not elsewhere classified: Secondary | ICD-10-CM

## 2021-04-20 MED ORDER — CHOLECALCIFEROL 1.25 MG (50000 UT) PO CAPS: ORAL_CAPSULE | ORAL | 0 refills | Status: DC

## 2021-04-20 MED ORDER — BUPROPION HCL ER (XL) 150 MG PO TB24: 150.0000 mg | ORAL_TABLET | Freq: Every day | ORAL | 0 refills | Status: DC

## 2021-04-27 NOTE — Progress Notes (Signed)
Chief Complaint:   OBESITY Maria Glenn is here to discuss her progress with her obesity treatment plan along with follow-up of her obesity related diagnoses. Maria Glenn is on keeping a food journal and adhering to recommended goals of 1600-1800 calories and 100 protein daily and states she is following her eating plan approximately 50% of the time. Maria Glenn states she is on treadmill for 35-40 minutes 2 times per week.  Today's visit was #: 10 Starting weight: 241 lbs Starting date: 12/07/2020 Today's weight: 219 lbs Today's date: 04/20/2021 Total lbs lost to date: 22 Total lbs lost since last in-office visit: 5  Interim History: Maria Glenn's husband had AMI the past  week with stents placed. She is under a lot of stress. She is missing many meals the last several days.  Subjective:   1. Vitamin D deficiency Maria Glenn is currently taking prescription vitamin D 50,000 IU each week. She denies nausea, vomiting or muscle weakness.  2. Situational stress, with emotional eating Maria Glenn is struggling with emotional eating and using food for comfort to the extent that it is negatively impacting her health. She has been working on behavior modification techniques to help reduce her emotional eating and has been somewhat successful. She shows no sign of suicidal or homicidal ideations.  3. At risk for malnutrition Maria Glenn is at increased risk for malnutrition due to decreased food intake after 2 weeks  Assessment/Plan:  No orders of the defined types were placed in this encounter.   Medications Discontinued During This Encounter  Medication Reason   buPROPion (WELLBUTRIN XL) 150 MG 24 hr tablet Reorder   Cholecalciferol 1.25 MG (50000 UT) capsule Reorder     Meds ordered this encounter  Medications   buPROPion (WELLBUTRIN XL) 150 MG 24 hr tablet    Sig: Take 1 tablet (150 mg total) by mouth daily.    Dispense:  30 tablet    Refill:  0    Ov for RF   Cholecalciferol 1.25 MG (50000 UT) capsule    Sig: 1  po q wkly    Dispense:  4 capsule    Refill:  0    Ov for RF     1. Vitamin D deficiency Low Vitamin D level contributes to fatigue and are associated with obesity, breast, and colon cancer. We will refill prescription Vitamin D for 1 month. Maria Glenn will follow-up for routine testing of Vitamin D, at least 2-3 times per year to avoid over-replacement.  - Cholecalciferol 1.25 MG (50000 UT) capsule; 1 po q wkly  Dispense: 4 capsule; Refill: 0  2. Situational stress, with emotional eating Behavior modification techniques were discussed today to help Maria Glenn deal with her emotional/non-hunger eating behaviors. We will refill Wellbutrin XL for 1 month. Orders and follow up as documented in patient record.   - buPROPion (WELLBUTRIN XL) 150 MG 24 hr tablet; Take 1 tablet (150 mg total) by mouth daily.  Dispense: 30 tablet; Refill: 0  3. At risk for malnutrition Maria Glenn was given approximately 15 minutes of counseling today regarding prevention of malnutrition and ways to meet macronutrient goals..   4. Obesity with current BMI of 35.5 Maria Glenn is currently in the action stage of change. As such, her goal is to continue with weight loss efforts. She has agreed to keeping a food journal and adhering to recommended goals of 1600-1800 calories and 120+ grams of protein daily.   Exercise goals: As is.  Behavioral modification strategies: increasing lean protein intake, decreasing simple carbohydrates, no  skipping meals, and meal planning and cooking strategies.  Maria Glenn has agreed to follow-up with our clinic in 2 to 3 weeks. She was informed of the importance of frequent follow-up visits to maximize her success with intensive lifestyle modifications for her multiple health conditions.   Objective:   Blood pressure 105/64, pulse 87, temperature 98 F (36.7 C), height '5\' 6"'$  (1.676 m), weight 219 lb (99.3 kg), SpO2 98 %. Body mass index is 35.35 kg/m.  General: Cooperative, alert, well developed, in no  acute distress. HEENT: Conjunctivae and lids unremarkable. Cardiovascular: Regular rhythm.  Lungs: Normal work of breathing. Neurologic: No focal deficits.   Lab Results  Component Value Date   CREATININE 0.68 12/07/2020   BUN 10 12/07/2020   NA 140 12/07/2020   K 4.5 12/07/2020   CL 98 12/07/2020   CO2 23 12/07/2020   Lab Results  Component Value Date   ALT 18 12/07/2020   AST 13 12/07/2020   ALKPHOS 98 12/07/2020   BILITOT 0.2 12/07/2020   Lab Results  Component Value Date   HGBA1C 6.0 (H) 03/14/2021   HGBA1C 6.3 (H) 12/07/2020   HGBA1C 5.7 (H) 03/23/2020   HGBA1C 5.5 01/22/2020   HGBA1C 5.7 07/16/2019   Lab Results  Component Value Date   INSULIN 8.3 03/14/2021   INSULIN 13.0 12/07/2020   INSULIN 8.7 03/23/2020   Lab Results  Component Value Date   TSH 2.920 03/14/2021   Lab Results  Component Value Date   CHOL 236 (H) 12/07/2020   HDL 56 12/07/2020   LDLCALC 160 (H) 12/07/2020   TRIG 112 12/07/2020   CHOLHDL 4.2 12/07/2020   Lab Results  Component Value Date   VD25OH 34.1 03/14/2021   VD25OH 24.5 (L) 12/07/2020   VD25OH 27.0 (L) 03/23/2020   Lab Results  Component Value Date   WBC 8.5 12/07/2020   HGB 12.7 12/07/2020   HCT 38.8 12/07/2020   MCV 85 12/07/2020   PLT 284 12/07/2020   No results found for: IRON, TIBC, FERRITIN  Attestation Statements:   Reviewed by clinician on day of visit: allergies, medications, problem list, medical history, surgical history, family history, social history, and previous encounter notes.   Wilhemena Durie, am acting as transcriptionist for [employer], DO.  I have reviewed the above documentation for accuracy and completeness, and I agree with the above. Marjory Sneddon, D.O.  The [place] was signed into law in 2016 which includes the topic of electronic health records.  This provides immediate access to information in MyChart.  This includes consultation notes, operative notes,  office notes, lab results and pathology reports.  If you have any questions about what you read please let us know at your next visit so we can discuss your concerns and take corrective action if need be.  We are right here with you.

## 2021-05-11 ENCOUNTER — Ambulatory Visit (INDEPENDENT_AMBULATORY_CARE_PROVIDER_SITE_OTHER): Payer: 59 | Admitting: Family Medicine

## 2021-05-25 ENCOUNTER — Ambulatory Visit (INDEPENDENT_AMBULATORY_CARE_PROVIDER_SITE_OTHER): Payer: 59 | Admitting: Family Medicine

## 2021-06-27 ENCOUNTER — Other Ambulatory Visit: Payer: Self-pay | Admitting: Obstetrics and Gynecology

## 2021-06-27 DIAGNOSIS — R928 Other abnormal and inconclusive findings on diagnostic imaging of breast: Secondary | ICD-10-CM

## 2021-07-14 ENCOUNTER — Ambulatory Visit
Admission: RE | Admit: 2021-07-14 | Discharge: 2021-07-14 | Disposition: A | Discharge status: Home / Self Care | Payer: 59 | Source: Ambulatory Visit | Attending: Obstetrics and Gynecology | Admitting: Obstetrics and Gynecology

## 2021-07-14 ENCOUNTER — Ambulatory Visit: Admission: RE | Admit: 2021-07-14 | Payer: Self-pay | Source: Ambulatory Visit

## 2021-07-14 ENCOUNTER — Other Ambulatory Visit: Payer: Self-pay

## 2021-07-14 DIAGNOSIS — R928 Other abnormal and inconclusive findings on diagnostic imaging of breast: Secondary | ICD-10-CM

## 2021-08-09 NOTE — Progress Notes (Signed)
BP 122/82   Pulse 78   Ht 5\' 6"  (1.676 m)   Wt 245 lb (111.1 kg)   SpO2 98%   BMI 39.54 kg/m    Subjective:    Patient ID: Maria Glenn, female    DOB: 1966-07-21, 55 y.o.   MRN: [id number]  HPI: Maria Glenn is a 55 y.o. female presenting on 08/10/2021 for comprehensive medical examination.  She see's Bobbye Charleston for GYN needs and is UTD on pap and mammogram.   Current medical concerns include: increased weight gain. Concerns for elevated blood sugar and cholesterol.   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   fexofenadine (ALLEGRA) 180 MG tablet Take 1 tablet (180 mg total) by mouth daily.   No current facility-administered medications on file prior to visit.   Interim Problems from her last visit: no   She reports regular vision exams q1-5y: yes She reports regular dental exams q 13m: yes Her diet consists of:  Home cooked meals- overall healthy. Does snack some during the day and will sometimes skip meals.  She endorses exercise and/or activity of:  Not much since  COVID- no scheduled routine She works at:  [employer]  She denies ETOH use  She denies nictoine use  She denies illegal substance use   She is perimenopausal She reports Irregular period with some menopausal symptoms of hot flashes.   She is currently sexually active with her spouse She denies concerns today about STI  She denies concerns about skin changes today She denies concerns about bowel changes today She denies concerns about bladder changes today  Depression Screen done today and results listed below:  Depression screen [facility] 2/9 08/10/2021 12/07/2020 03/23/2020 01/22/2020 01/22/2020  Decreased Interest 1 3 2  0 0  Down, Depressed, Hopeless - 1 1 0 0  PHQ - 2 Score 1 4 3  0 0  Altered sleeping 1 1 0 0 -  Tired, decreased energy 1 3 2  0 -  Change in appetite 1 1 1  0 -  Feeling bad or failure about yourself  0 1 1 0 -  Trouble concentrating 0 0 0 0 -  Moving slowly or fidgety/restless 0 1 0 0 -  Suicidal thoughts 0 0 0 0 -  PHQ-9 Score 4 11 7  0 -  Difficult doing work/chores Not difficult at all Not difficult at all Not difficult at all - -   Anxiety Screening done GAD  7 : Generalized Anxiety Score 08/10/2021  Nervous, Anxious, on Edge 0  Control/stop worrying 0  Worry too much - different things 0  Trouble relaxing 0  Restless 0  Easily annoyed or irritable 0  Afraid - awful might happen 0  Total GAD 7 Score 0  Anxiety Difficulty Not difficult at all    She does not have a history of falls. I did complete a risk assessment for falls. A plan of care for falls was documented. Fall Risk  08/10/2021 04/09/2020 01/22/2020 07/16/2019 12/19/2017  Falls in the past year? 0 0 0 0 No  Number falls in past yr: 0 0 0 0 -  Injury with Fall? 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  Tobacco Use   Smoking status: Never   Smokeless tobacco: Never  Vaping Use   Vaping Use: Never used  Substance and Sexual Activity   Alcohol use: No    Alcohol/week: 0.0 standard drinks   Drug use: No   Sexual activity: Yes    Birth control/protection: Other-see comments    Comment: husband had vasectomy  Other Topics Concern   Not on file  Social History Narrative   Right handed   Two story home   Drinks caffeine   Social Determinants of Health   Financial Resource Strain: Not on file  Food Insecurity: Not on file  Transportation Needs: Not on file  Physical Activity: Not on file  Stress: Not on file  Social Connections: Not on file  Intimate Partner Violence: Not on file   Social History   Tobacco Use  Smoking Status Never  Smokeless Tobacco Never   Social History   Substance and Sexual Activity  Alcohol Use No   Alcohol/week: 0.0 standard drinks    Family History:  Family History  Problem Relation Age of Onset   Hypertension Mother    Hyperlipidemia Mother    Cancer Father        Lung   Colon cancer Neg Hx    Colon polyps Neg Hx    Esophageal cancer Neg Hx    Stomach cancer Neg Hx    Rectal cancer Neg Hx     Past medical history, surgical history, medications, allergies, family history and social history reviewed with patient today and changes made to appropriate areas of the chart.   All ROS negative except what is listed above and in the HPI.      Objective:    BP 122/82   Pulse 78   Ht 5\' 6"  (1.676 m)   Wt 245 lb (111.1 kg)   SpO2 98%   BMI 39.54 kg/m   Wt Readings from Last 3  Encounters:  08/10/21 245 lb (111.1 kg)  04/20/21 219 lb (99.3 kg)  04/06/21 224 lb (101.6 kg)    Physical Exam Vitals  and nursing note reviewed.  Constitutional:      General: She is not in acute distress.    Appearance: Normal appearance.  HENT:     Head: Normocephalic and atraumatic.     Right Ear: Hearing, tympanic membrane, ear canal and external ear normal.     Left Ear: Hearing, tympanic membrane, ear canal and external ear normal.     Nose: Nose normal.     Right Sinus: No maxillary sinus tenderness or frontal sinus tenderness.     Left Sinus: No maxillary sinus tenderness or frontal sinus tenderness.     Mouth/Throat:     Lips: Pink.     Mouth: Mucous membranes are moist.     Pharynx: Oropharynx is clear.  Eyes:     General: Lids are normal. Vision grossly intact.     Extraocular Movements: Extraocular movements intact.     Conjunctiva/sclera: Conjunctivae normal.     Pupils: Pupils are equal, round, and reactive to light.     Funduscopic exam:    Right eye: Red reflex present.        Left eye: Red reflex present.    Visual Fields: Right eye visual fields normal and left eye visual fields normal.  Neck:     Thyroid: No thyromegaly.     Vascular: No carotid bruit.  Cardiovascular:     Rate and Rhythm: Normal rate and regular rhythm.     Chest Wall: PMI is not displaced.     Pulses: Normal pulses.          Dorsalis pedis pulses are 2+ on the right side and 2+ on the left side.       Posterior tibial pulses are 2+ on the right side and 2+ on the left side.     Heart sounds: Normal heart sounds. No murmur heard. Pulmonary:     Effort: Pulmonary effort is normal. No respiratory distress.     Breath sounds: Normal breath sounds.  Abdominal:     General: Abdomen is flat. Bowel sounds are normal. There is no distension.     Palpations: Abdomen is soft. There is no hepatomegaly, splenomegaly or mass.     Tenderness: There is no abdominal tenderness. There is no right CVA  tenderness, left CVA tenderness, guarding or rebound.  Musculoskeletal:        General: Normal range of motion.     Cervical back: Full passive range of motion without pain, normal range of motion and neck supple. No tenderness.     Right lower leg: No edema.     Left lower leg: No edema.  Feet:     Left foot:     Toenail Condition: Left toenails are normal.  Lymphadenopathy:     Cervical: No cervical adenopathy.     Upper Body:     Right upper body: No supraclavicular adenopathy.     Left upper body: No supraclavicular adenopathy.  Skin:    General: Skin is warm and dry.     Capillary Refill: Capillary refill takes less than 2 seconds.     Nails: There is no clubbing.  Neurological:     General: No focal deficit present.     Mental Status: She is alert and oriented to person, place, and time.     GCS: GCS eye subscore is 4. GCS verbal subscore is 5. GCS motor subscore is 6.     Sensory: Sensation is intact.     Motor: Motor function is intact.  Coordination: Coordination is intact.     Gait: Gait is intact.     Deep Tendon Reflexes: Reflexes are normal and symmetric.  Psychiatric:        Attention and Perception: Attention normal.        Mood and Affect: Mood normal.        Speech: Speech normal.        Behavior: Behavior normal. Behavior is cooperative.        Thought Content: Thought content normal.        Cognition and Memory: Cognition and memory normal.        Judgment: Judgment normal.    Results for orders placed or performed in visit on 03/14/21  Hemoglobin A1c  Result Value Ref Range   Hgb A1c MFr Bld 6.0 (H) 4.8 - 5.6 %   Est. average glucose Bld gHb Est-mCnc 126 mg/dL  Insulin, random  Result Value Ref Range   INSULIN 8.3 2.6 - 24.9 uIU/mL  VITAMIN D 25 Hydroxy (Vit-D Deficiency, Fractures)  Result Value Ref Range   Vit D, 25-Hydroxy 34.1 30.0 - 100.0 ng/mL  TSH  Result Value Ref Range   TSH 2.920 0.450 - 4.500 uIU/mL  T4, free  Result Value Ref  Range   Free T4 1.30 0.82 - 1.77 ng/dL      Assessment & Plan:   Problem List Items Addressed This Visit     Heterozygous factor V Leiden mutation ([place]) (Chronic)    No concerning findings present today.  Continue to monitor      Relevant Orders   CBC with Differential/Platelet   Acquired hypothyroidism    Will monitor lab levels today Refills provided- uses the branded Synthroid for best results No alarm symptoms present.  Slightly elevated levels in the past have normalized with continued use of current dosage. She requests we do not change the dose unless significantly altered.       Relevant Medications   levothyroxine (SYNTHROID) 88 MCG tablet   Other Relevant Orders   TSH   Vitamin D deficiency    Will check labs today      Relevant Orders   VITAMIN D 25 Hydroxy (Vit-D Deficiency, Fractures)   RESOLVED: Other specified hypothyroidism   Relevant Medications   levothyroxine (SYNTHROID) 88 MCG tablet   Prediabetes    Historically- last A1c 6.0% Will monitor labs today Discussion of treatment option with GLP-1 provided today She is interested in this due to weight gain, cholesterol, and elevated blood sugar readings.  Will see where labs fall and make recommendations based on results.  Ozempic coupon provided for patient today.       Relevant Orders   CBC with Differential/Platelet   Comprehensive metabolic panel   Hemoglobin A1c   Lipid panel   Other hyperlipidemia    Historical No medications at this time Will monitor labs today Consider GLP-1 based on lab results for multiple concerns with elevated BG, HLD, weight gain.       Relevant Orders   Lipid panel   B12 deficiency    Will monitor today      Relevant Orders   B12 and Folate Panel   At risk for heart disease    Elevated lipids and fasting BG levels.  Will monitor labs today Discussion about GLP-1 to help reduce risks and control BG, lipids, and weight.  We will see where her labs fall and  what medication options are best covered by her insurance provider and follow-up with  medication recommendations.       Relevant Orders   CBC with Differential/Platelet   Comprehensive metabolic panel   Hemoglobin A1c   Lipid panel   TSH   VITAMIN D 25 Hydroxy (Vit-D Deficiency, Fractures)   Encounter for annual physical exam - Primary    CPE today with no concerning findings.  Will obtain labs for further evaluation. Pap and Mammo UTD- normal.  Colonoscopy UTD- normal.         Relevant Orders   CBC with Differential/Platelet   Comprehensive metabolic panel   Hemoglobin A1c   Lipid panel   TSH   VITAMIN D 25 Hydroxy (Vit-D Deficiency, Fractures)   BMI 38.0-38.9,adult    Labs today Recommend at least 20 minutes of walking daily for activity.  Recommend no more than 180g of carbohydrates a day with no more than 13g saturated fat.  Increased fiber intake to 30g per day can be helpful. Will plan to start GLP-1 based on lab results and insurance coverage.      Relevant Orders   CBC with Differential/Platelet   Comprehensive metabolic panel   Hemoglobin A1c   Lipid panel   TSH   At risk for impaired metabolic function    Elevated A1c, FBG, Lipids, and BMI. Labs today Plan for GLP-1 based on labs and insurance coverage. Will determine best treatment once labs have resulted and schedule f/u after medication start.  Education today on medications and how to use.       Relevant Orders   CBC with Differential/Platelet   Comprehensive metabolic panel   Hemoglobin A1c   Lipid panel   TSH   VITAMIN D 25 Hydroxy (Vit-D Deficiency, Fractures)   Other Visit Diagnoses     Need for immunization against influenza       Relevant Orders   Flu Vaccine QUAD 6+ mos PF IM (Fluarix Quad PF) (Completed)        Follow up plan: No follow-ups on file.   LABORATORY TESTING:  - Pap smear: up to date - STI testing: deferred  IMMUNIZATIONS:   - Tdap: Tetanus vaccination status  reviewed: last tetanus booster within 10 years. - Influenza: Up to date - Pneumovax: Not applicable - Prevnar: Not applicable - HPV: Not applicable - Zostavax vaccine: Not applicable  SCREENING: -Mammogram: Up to date  - Colonoscopy: Up to date  - Bone Density: Not applicable  -Hearing Test: Not applicable  -Spirometry: Not applicable   PATIENT COUNSELING:   For all adult patients, I recommend   A well balanced diet low in saturated fats, cholesterol, and moderation in carbohydrates.   This can be as simple as monitoring portion sizes and cutting back on sugary beverages such as soda and  juice to start with.    Daily water consumption of at least 64 ounces.  Physical activity at least 180 minutes per week, if just starting out.   This can be as simple as taking the stairs instead of the elevator and walking 2-3 laps around the office  purposefully every day.   STD protection, partner selection, and regular testing if high risk.  Limited consumption of alcoholic beverages if alcohol is consumed.  For women, I recommend no more than 7 alcoholic beverages per week, spread out throughout the week.  Avoid "binge" drinking or consuming large quantities of alcohol in one setting.   Please let me know if you feel you may need help with reduction or quitting alcohol consumption.   Avoidance of nicotine,  if used.  Please let me know if you feel you may need help with reduction or quitting nicotine use.   Daily mental health attention.  This can be in the form of 5 minute daily meditation, prayer, journaling, yoga, reflection, etc.   Purposeful attention to your emotions and mental state can significantly improve your overall wellbeing  and  Health.  Please know that I am here to help you with all of your health care goals and am happy to work with you to find a solution that works best for you.  The greatest advice I have received with any changes in life are to take it one step at a  time, that even means if all you can focus on is the next 60 seconds, then do that and celebrate your victories.  With any changes in life, you will have set backs, and that is OK. The important thing to remember is, if you have a set back, it is not a failure, it is an opportunity to try again!  Health Maintenance Recommendations Screening Testing Mammogram Every 1 -2 years based on history and risk factors Starting at age 3 Pap Smear Ages 21-39 every 3 years Ages 84-65 every 5 years with HPV testing More frequent testing may be required based on results and history Colon Cancer Screening Every 1-10 years based on test performed, risk factors, and history Starting at age 38 Bone Density Screening Every 2-10 years based on history Starting at age 13 for women Recommendations for men differ based on medication usage, history, and risk factors AAA Screening One time ultrasound Men 49-78 years old who have every smoked Lung Cancer Screening Low Dose Lung CT every 12 months Age 66-80 years with a 30 pack-year smoking history who still smoke or who have quit within the last 15 years  Screening Labs Routine  Labs: Complete Blood Count (CBC), Complete Metabolic Panel (CMP), Cholesterol (Lipid Panel) Every 6-12 months based on history and medications May be recommended more frequently based on current conditions or previous results Hemoglobin A1c Lab Every 3-12 months based on history and previous results Starting at age 39 or earlier with diagnosis of diabetes, high cholesterol, BMI >26, and/or risk factors Frequent monitoring for patients with diabetes to ensure blood sugar control Thyroid Panel (TSH w/ T3 & T4) Every 6 months based on history, symptoms, and risk factors May be repeated more often if on medication HIV One time testing for all patients 31 and older May be repeated more frequently for patients with increased risk factors or exposure Hepatitis C One time testing for  all patients 31 and older May be repeated more frequently for patients with increased risk factors or exposure Gonorrhea, Chlamydia Every 12 months for all sexually active persons 13-24 years Additional monitoring may be recommended for those who are considered high risk or who have symptoms PSA Men 59-86 years old with risk factors Additional screening may be recommended from age 11-69 based on risk factors, symptoms, and history  Vaccine Recommendations Tetanus Booster All adults every 10 years Flu Vaccine All patients 6 months and older every year COVID Vaccine All patients 12 years and older Initial dosing with booster May recommend additional booster based on age and health history HPV Vaccine 2 doses all patients age 26-26 Dosing may be considered for patients over 26 Shingles Vaccine (Shingrix) 2 doses all adults 89 years and older Pneumonia (Pneumovax 23) All adults 65 years and older May recommend earlier dosing based on health  history Pneumonia (Prevnar 67) All adults 90 years and older Dosed 1 year after Pneumovax 23  Additional Screening, Testing, and Vaccinations may be recommended on an individualized basis based on family history, health history, risk factors, and/or exposure.      NEXT PREVENTATIVE PHYSICAL DUE IN 1 YEAR. No follow-ups on file.

## 2021-08-09 NOTE — Patient Instructions (Signed)
Recommendations from today's visit: We will get your labs today and I will let you know our next steps I have sent the Synthroid in for you. If we need to make changes I will let you know.

## 2021-08-10 ENCOUNTER — Encounter (HOSPITAL_BASED_OUTPATIENT_CLINIC_OR_DEPARTMENT_OTHER): Payer: Self-pay | Admitting: Nurse Practitioner

## 2021-08-10 ENCOUNTER — Other Ambulatory Visit: Payer: Self-pay

## 2021-08-10 ENCOUNTER — Ambulatory Visit (INDEPENDENT_AMBULATORY_CARE_PROVIDER_SITE_OTHER): Payer: 59 | Admitting: Nurse Practitioner

## 2021-08-10 VITALS — BP 122/82 | HR 78 | Ht 66.0 in | Wt 245.0 lb

## 2021-08-10 DIAGNOSIS — R7303 Prediabetes: Secondary | ICD-10-CM

## 2021-08-10 DIAGNOSIS — D6851 Activated protein C resistance: Secondary | ICD-10-CM

## 2021-08-10 DIAGNOSIS — Z6838 Body mass index (BMI) 38.0-38.9, adult: Secondary | ICD-10-CM

## 2021-08-10 DIAGNOSIS — Z9189 Other specified personal risk factors, not elsewhere classified: Secondary | ICD-10-CM

## 2021-08-10 DIAGNOSIS — E039 Hypothyroidism, unspecified: Secondary | ICD-10-CM

## 2021-08-10 DIAGNOSIS — Z0000 Encounter for general adult medical examination without abnormal findings: Secondary | ICD-10-CM | POA: Diagnosis not present

## 2021-08-10 DIAGNOSIS — E038 Other specified hypothyroidism: Secondary | ICD-10-CM

## 2021-08-10 DIAGNOSIS — E538 Deficiency of other specified B group vitamins: Secondary | ICD-10-CM

## 2021-08-10 DIAGNOSIS — E7849 Other hyperlipidemia: Secondary | ICD-10-CM

## 2021-08-10 DIAGNOSIS — Z23 Encounter for immunization: Secondary | ICD-10-CM | POA: Diagnosis not present

## 2021-08-10 DIAGNOSIS — E559 Vitamin D deficiency, unspecified: Secondary | ICD-10-CM

## 2021-08-10 MED ORDER — LEVOTHYROXINE SODIUM 88 MCG PO TABS: 88.0000 ug | ORAL_TABLET | Freq: Every day | ORAL | 0 refills | Status: DC

## 2021-08-10 NOTE — Assessment & Plan Note (Addendum)
Will monitor lab levels today Refills provided- uses the branded Synthroid for best results No alarm symptoms present.  Slightly elevated levels in the past have normalized with continued use of current dosage. She requests we do not change the dose unless significantly altered.

## 2021-08-10 NOTE — Assessment & Plan Note (Signed)
Elevated lipids and fasting BG levels.  Will monitor labs today Discussion about GLP-1 to help reduce risks and control BG, lipids, and weight.  We will see where her labs fall and what medication options are best covered by her insurance provider and follow-up with medication recommendations.

## 2021-08-10 NOTE — Assessment & Plan Note (Signed)
Will monitor today

## 2021-08-10 NOTE — Assessment & Plan Note (Signed)
Elevated A1c, FBG, Lipids, and BMI. Labs today Plan for GLP-1 based on labs and insurance coverage. Will determine best treatment once labs have resulted and schedule f/u after medication start.  Education today on medications and how to use.

## 2021-08-10 NOTE — Assessment & Plan Note (Signed)
No concerning findings present today.  Continue to monitor

## 2021-08-10 NOTE — Assessment & Plan Note (Signed)
Will check labs today

## 2021-08-10 NOTE — Assessment & Plan Note (Addendum)
Historically- last A1c 6.0% Will monitor labs today Discussion of treatment option with GLP-1 provided today She is interested in this due to weight gain, cholesterol, and elevated blood sugar readings.  Will see where labs fall and make recommendations based on results.  Ozempic coupon provided for patient today.

## 2021-08-10 NOTE — Assessment & Plan Note (Addendum)
CPE today with no concerning findings.  Will obtain labs for further evaluation. Pap and Mammo UTD- normal.  Colonoscopy UTD- normal.

## 2021-08-10 NOTE — Assessment & Plan Note (Signed)
Labs today Recommend at least 20 minutes of walking daily for activity.  Recommend no more than 180g of carbohydrates a day with no more than 13g saturated fat.  Increased fiber intake to 30g per day can be helpful. Will plan to start GLP-1 based on lab results and insurance coverage.

## 2021-08-10 NOTE — Assessment & Plan Note (Signed)
Historical No medications at this time Will monitor labs today Consider GLP-1 based on lab results for multiple concerns with elevated BG, HLD, weight gain.

## 2021-08-13 LAB — CBC WITH DIFFERENTIAL/PLATELET
Basophils Absolute: 0.1 10*3/uL (ref 0.0–0.2)
Basos: 1 %
EOS (ABSOLUTE): 0.4 10*3/uL (ref 0.0–0.4)
Eos: 5 %
Hematocrit: 39.6 % (ref 34.0–46.6)
Hemoglobin: 13 g/dL (ref 11.1–15.9)
Immature Grans (Abs): 0 10*3/uL (ref 0.0–0.1)
Immature Granulocytes: 0 %
Lymphocytes Absolute: 2.7 10*3/uL (ref 0.7–3.1)
Lymphs: 34 %
MCH: 28.2 pg (ref 26.6–33.0)
MCHC: 32.8 g/dL (ref 31.5–35.7)
MCV: 86 fL (ref 79–97)
Monocytes Absolute: 0.6 10*3/uL (ref 0.1–0.9)
Monocytes: 7 %
Neutrophils Absolute: 4.3 10*3/uL (ref 1.4–7.0)
Neutrophils: 53 %
Platelets: 242 10*3/uL (ref 150–450)
RBC: 4.61 x10E6/uL (ref 3.77–5.28)
RDW: 13.9 % (ref 11.7–15.4)
WBC: 8 10*3/uL (ref 3.4–10.8)

## 2021-08-13 LAB — COMPREHENSIVE METABOLIC PANEL
ALT: 28 IU/L (ref 0–32)
AST: 26 IU/L (ref 0–40)
Albumin/Globulin Ratio: 1.8 (ref 1.2–2.2)
Albumin: 4.5 g/dL (ref 3.8–4.9)
Alkaline Phosphatase: 97 IU/L (ref 44–121)
BUN/Creatinine Ratio: 21 (ref 9–23)
BUN: 14 mg/dL (ref 6–24)
Bilirubin Total: 0.2 mg/dL (ref 0.0–1.2)
CO2: 18 mmol/L — ABNORMAL LOW (ref 20–29)
Calcium: 9.3 mg/dL (ref 8.7–10.2)
Chloride: 102 mmol/L (ref 96–106)
Creatinine, Ser: 0.67 mg/dL (ref 0.57–1.00)
Globulin, Total: 2.5 g/dL (ref 1.5–4.5)
Glucose: 114 mg/dL — ABNORMAL HIGH (ref 70–99)
Potassium: 4.7 mmol/L (ref 3.5–5.2)
Sodium: 140 mmol/L (ref 134–144)
Total Protein: 7 g/dL (ref 6.0–8.5)
eGFR: 104 mL/min/{1.73_m2} (ref >59)

## 2021-08-13 LAB — B12 AND FOLATE PANEL

## 2021-08-13 LAB — LIPID PANEL
Chol/HDL Ratio: 4.8 ratio — ABNORMAL HIGH (ref 0.0–4.4)
Cholesterol, Total: 293 mg/dL — ABNORMAL HIGH (ref 100–199)
HDL: 61 mg/dL (ref >39)
LDL Chol Calc (NIH): 204 mg/dL — ABNORMAL HIGH (ref 0–99)
Triglycerides: 153 mg/dL — ABNORMAL HIGH (ref 0–149)
VLDL Cholesterol Cal: 28 mg/dL (ref 5–40)

## 2021-08-13 LAB — HEMOGLOBIN A1C
Est. average glucose Bld gHb Est-mCnc: 126 mg/dL
Hgb A1c MFr Bld: 6 % — ABNORMAL HIGH (ref 4.8–5.6)

## 2021-08-13 LAB — VITAMIN D 25 HYDROXY (VIT D DEFICIENCY, FRACTURES): Vit D, 25-Hydroxy: 31.1 ng/mL (ref 30.0–100.0)

## 2021-08-13 LAB — TSH: TSH: 3.07 u[IU]/mL (ref 0.450–4.500)

## 2021-08-16 ENCOUNTER — Telehealth (HOSPITAL_BASED_OUTPATIENT_CLINIC_OR_DEPARTMENT_OTHER): Payer: Self-pay

## 2021-08-16 DIAGNOSIS — Z6838 Body mass index (BMI) 38.0-38.9, adult: Secondary | ICD-10-CM

## 2021-08-16 DIAGNOSIS — R7303 Prediabetes: Secondary | ICD-10-CM

## 2021-08-16 DIAGNOSIS — E7849 Other hyperlipidemia: Secondary | ICD-10-CM

## 2021-08-16 DIAGNOSIS — Z9189 Other specified personal risk factors, not elsewhere classified: Secondary | ICD-10-CM

## 2021-08-16 MED ORDER — OZEMPIC (0.25 OR 0.5 MG/DOSE) 2 MG/1.5ML ~~LOC~~ SOPN: 0.5000 mg | PEN_INJECTOR | SUBCUTANEOUS | 0 refills | Status: AC

## 2021-08-16 MED ORDER — OZEMPIC (0.25 OR 0.5 MG/DOSE) 2 MG/1.5ML ~~LOC~~ SOPN: PEN_INJECTOR | SUBCUTANEOUS | 0 refills | Status: AC

## 2021-08-16 MED ORDER — PEN NEEDLES 32G X 4 MM MISC: 11 refills | Status: DC

## 2021-08-16 MED ORDER — SEMAGLUTIDE (1 MG/DOSE) 4 MG/3ML ~~LOC~~ SOPN: 1.0000 mg | PEN_INJECTOR | SUBCUTANEOUS | 0 refills | Status: DC

## 2021-08-16 MED ORDER — ROSUVASTATIN CALCIUM 10 MG PO TABS: 10.0000 mg | ORAL_TABLET | Freq: Every day | ORAL | 3 refills | Status: DC

## 2021-08-16 NOTE — Telephone Encounter (Signed)
Patient called in this morning to update Maria Glenn on her decision to start Ozempic and statin therapy for lipids based on Maria's recommendations from her most recent labs. Patient states she is agreeable to starting both medications.  "A1c: Your A1c: 6.0 %. Hemoglobin A1c gives [place] an average blood sugar over the last three months. This tells me your average blood sugar is elevated to pre-diabetes levels.  Recommendations: Review of previous labs shows that you have been around this mark for a while. Based on this and the cholesterol levels, we can try for the GLP-1 medication if you are interested. Let me know your thoughts.    Lipids (Cholesterol): Your overall cholesterol is abnormal - it is elevated. Your LDL (bad cholesterol) is abnormal - it is significantly elevated. Your HDL (good cholesterol) is normal. Your triglycerides are abnormal - these are elevated as well. Recommendations: These findings show me that you are at risk of cardiovascular disease, including heart attack and stroke. In people with high blood sugars, the risk increases and we like to keep a very close watch on these levels. We can certainly start with a GLP-1, but we also need to consider medication to help lower your cholesterol levels and help protect you."   Will route to Maria Glenn for advisement on stating and GLP-1 initiation

## 2021-09-05 ENCOUNTER — Other Ambulatory Visit (HOSPITAL_BASED_OUTPATIENT_CLINIC_OR_DEPARTMENT_OTHER): Payer: Self-pay | Admitting: Nurse Practitioner

## 2021-09-05 DIAGNOSIS — E038 Other specified hypothyroidism: Secondary | ICD-10-CM

## 2021-09-06 ENCOUNTER — Other Ambulatory Visit: Payer: Self-pay

## 2021-09-06 ENCOUNTER — Encounter (HOSPITAL_BASED_OUTPATIENT_CLINIC_OR_DEPARTMENT_OTHER): Payer: Self-pay | Admitting: Nurse Practitioner

## 2021-09-06 ENCOUNTER — Ambulatory Visit (HOSPITAL_BASED_OUTPATIENT_CLINIC_OR_DEPARTMENT_OTHER): Payer: 59 | Admitting: Nurse Practitioner

## 2021-09-06 VITALS — BP 117/72 | HR 69 | Ht 66.0 in | Wt 245.0 lb

## 2021-09-06 DIAGNOSIS — R221 Localized swelling, mass and lump, neck: Secondary | ICD-10-CM | POA: Insufficient documentation

## 2021-09-06 LAB — CBC WITH DIFFERENTIAL/PLATELET
Basophils Absolute: 0.1 10*3/uL (ref 0.0–0.2)
Basos: 1 %
EOS (ABSOLUTE): 0.4 10*3/uL (ref 0.0–0.4)
Eos: 5 %
Hematocrit: 43.2 % (ref 34.0–46.6)
Hemoglobin: 14 g/dL (ref 11.1–15.9)
Immature Grans (Abs): 0 10*3/uL (ref 0.0–0.1)
Immature Granulocytes: 0 %
Lymphocytes Absolute: 2.8 10*3/uL (ref 0.7–3.1)
Lymphs: 40 %
MCH: 27.9 pg (ref 26.6–33.0)
MCHC: 32.4 g/dL (ref 31.5–35.7)
MCV: 86 fL (ref 79–97)
Monocytes Absolute: 0.4 10*3/uL (ref 0.1–0.9)
Monocytes: 5 %
Neutrophils Absolute: 3.5 10*3/uL (ref 1.4–7.0)
Neutrophils: 49 %
Platelets: 244 10*3/uL (ref 150–450)
RBC: 5.02 x10E6/uL (ref 3.77–5.28)
RDW: 13.3 % (ref 11.7–15.4)
WBC: 7.1 10*3/uL (ref 3.4–10.8)

## 2021-09-06 NOTE — Progress Notes (Signed)
Acute Office Visit  Subjective:    Patient ID: Maria Glenn, female    DOB: [birth year], 55 y.o.   MRN: [id number]  Chief Complaint  Patient presents with   Acute Visit    Patient noticed a knot under left ear at jaw line on Friday. No pain, no other symptoms. No fever.     HPI Patient is in today for swelling along left jaw line that she first noticed on Friday afternoon.  She endorses rubbing her jaw when she felt the nodularity present.  She is unsure if this has been there before and she had not noticed it or if it was new. She denies fever, chills, weight loss, sore throat, eat pain, cough, lymph node enlargement in other areas.  No change in voice, feeling of fullness in throat, or swallowing difficulty.  She endorses a history of enlarged lymph nodes detected on imaging and a single nodular lymph node in the posterior cervical region on the left side several years ago. Close evaluation of this showed a benign reason.  She did start Ozempic and rosuvastatin one week prior to noticing the lump, but no other changes.  She held her next dose of Ozempic due to the finding.  Flu shot 3 weeks prior to noticing, but in ipsilateral arm.  She reports the nodule is non-tender and has not changed in size since she first noticed it.   Past Medical History:  Diagnosis Date   Allergy    B12 deficiency    Class 2 severe obesity with serious comorbidity and body mass index (BMI) of 39.0 to 39.9 in adult [facility]) 06/09/2020   Clotting disorder ([place])    factor V Leiden   DVT (deep venous thrombosis) (HCC)    Factor V Leiden ([place])    History of blood clots    History of DVT (deep vein thrombosis) 08/09/2015   Hyperlipidemia    Hypothyroidism    Left leg DVT ([place]) 08/09/2015   Other fatigue 12/07/2020   Other hyperlipidemia 12/07/2020   Other specified hypothyroidism 12/07/2020   Prediabetes    SOBOE (shortness of breath on exertion)    SOBOE (shortness of breath on exertion) 12/07/2020    Thyroid disease    Trigeminal neuralgia    Visit for preventive health examination 11/30/2016   Vitamin D deficiency     Past Surgical History:  Procedure Laterality Date   CESAREAN SECTION      Family History  Problem Relation Age of Onset   Hypertension Mother    Hyperlipidemia Mother    Cancer Father        Lung   Colon cancer Neg Hx    Colon polyps Neg Hx    Esophageal cancer Neg Hx    Stomach cancer Neg Hx    Rectal cancer Neg Hx     Social History   Socioeconomic History   Marital status: Married    Spouse name: Johnice Riebe   Number of children: 1   Years of education: 12   Highest education level: Not on file  Occupational History   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  Tobacco Use   Smoking status: Never   Smokeless tobacco: Never  Vaping Use   Vaping Use: Never used  Substance and Sexual Activity   Alcohol use: No    Alcohol/week: 0.0 standard drinks   Drug use: No   Sexual activity: Yes    Birth control/protection: Other-see comments    Comment: husband had vasectomy  Other Topics Concern   Not on file  Social History Narrative   Right handed   Two story home   Drinks caffeine   Social Determinants of Health   Financial Resource Strain: Not on file  Food Insecurity: Not on file  Transportation Needs: Not on file  Physical Activity: Not on file  Stress: Not on file  Social Connections: Not on file  Intimate Partner Violence: Not on file    Outpatient Medications Prior to Visit  Medication Sig Dispense Refill   aspirin 81 MG chewable tablet Chew 81 mg by mouth daily.     fexofenadine (ALLEGRA) 180 MG tablet Take 1 tablet (180 mg total) by mouth daily. 90 tablet 3   Insulin Pen Needle (PEN NEEDLES) 32G X 4 MM MISC Use one new needle per week with injection of Ozempic 30 each 11   rosuvastatin (CRESTOR) 10 MG tablet Take 1 tablet (10 mg total) by mouth daily. 90 tablet 3   Semaglutide, 1 MG/DOSE, 4 MG/3ML SOPN Inject 1 mg as directed once a  week. Pen 3- use after completion of Pen 2. 3 mL 0   Semaglutide,0.25 or 0.5MG/DOS, (OZEMPIC, 0.25 OR 0.5 MG/DOSE,) 2 MG/1.5ML SOPN Inject 0.25 mg into the skin once a week for 28 days, THEN 0.5 mg once a week for 14 days. Pen 1 (Starter Pen). 1.5 mL 0   Semaglutide,0.25 or 0.5MG/DOS, (OZEMPIC, 0.25 OR 0.5 MG/DOSE,) 2 MG/1.5ML SOPN Inject 0.5 mg into the skin once a week for 28 days. Pen 2- Use after completion of Pen 1 1.5 mL 0   levothyroxine (SYNTHROID) 88 MCG tablet Take 1 tablet (88 mcg total) by mouth daily. Brand only 30 tablet 0   No facility-administered medications prior to visit.    Allergies  Allergen Reactions   Penicillins Shortness Of Breath    Review of Systems All review of systems negative except what is listed in the HPI     Objective:    Physical Exam Vitals and nursing note reviewed.  Constitutional:      Appearance: Normal appearance. She is not ill-appearing.  HENT:     Head: Normocephalic.     Right Ear: Tympanic membrane, ear canal and external ear normal.     Left Ear: Tympanic membrane, ear canal and external ear normal.     Nose: Nose normal. No congestion or rhinorrhea.     Mouth/Throat:     Mouth: Mucous membranes are moist.     Pharynx: Oropharynx is clear. No oropharyngeal exudate or posterior oropharyngeal erythema.     Comments: Tonsils bilaterally enlarged at baseline with no erythema or exudate present.  Eyes:     Extraocular Movements: Extraocular movements intact.     Conjunctiva/sclera: Conjunctivae normal.     Pupils: Pupils are equal, round, and reactive to light.  Neck:     Thyroid: No thyroid mass, thyromegaly or thyroid tenderness.     Vascular: Normal carotid pulses. No carotid bruit or JVD.     Trachea: Trachea and phonation normal.   Cardiovascular:     Rate and Rhythm: Normal rate and regular rhythm.     Pulses: Normal pulses.     Heart sounds: Normal heart sounds. No murmur heard. Pulmonary:     Effort: Pulmonary effort is  normal. No respiratory distress.     Breath sounds: Normal breath sounds. No wheezing or rhonchi.  Chest:     Chest wall: No tenderness.  Abdominal:     General: Abdomen is flat.  Bowel sounds are normal. There is no distension.     Palpations: Abdomen is soft.     Tenderness: There is no abdominal tenderness. There is no right CVA tenderness, left CVA tenderness, guarding or rebound.  Musculoskeletal:        General: Normal range of motion.     Cervical back: Normal range of motion. No rigidity or tenderness. Normal range of motion.     Right lower leg: No edema.     Left lower leg: No edema.  Lymphadenopathy:     Cervical: Cervical adenopathy present.     Right cervical: No superficial, deep or posterior cervical adenopathy.    Left cervical: No superficial, deep or posterior cervical adenopathy.  Skin:    General: Skin is warm and dry.     Capillary Refill: Capillary refill takes less than 2 seconds.     Findings: No erythema or rash.  Neurological:     General: No focal deficit present.     Mental Status: She is alert and oriented to person, place, and time.     Cranial Nerves: No cranial nerve deficit.     Sensory: No sensory deficit.     Motor: No weakness.  Psychiatric:        Mood and Affect: Mood normal.        Behavior: Behavior normal.        Thought Content: Thought content normal.        Judgment: Judgment normal.    BP 117/72   Pulse 69   Ht _0  (1.676 m)   Wt 245 lb (111.1 kg)   SpO2 97%   BMI 39.54 kg/m  Wt Readings from Last 3 Encounters:  09/06/21 245 lb (111.1 kg)  08/10/21 245 lb (111.1 kg)  04/20/21 219 lb (99.3 kg)    Health Maintenance Due  Topic Date Due   Zoster Vaccines- Shingrix (1 of 2) Never done   COVID-19 Vaccine (4 - Booster for Pfizer series) 11/30/2020   PAP SMEAR-Modifier  06/12/2021    There are no preventive care reminders to display for this patient.   Lab Results  Component Value Date   TSH 3.070 08/10/2021   Lab  Results  Component Value Date   WBC 8.0 08/10/2021   HGB 13.0 08/10/2021   HCT 39.6 08/10/2021   MCV 86 08/10/2021   PLT 242 08/10/2021   Lab Results  Component Value Date   NA 140 08/10/2021   K 4.7 08/10/2021   CO2 18 (L) 08/10/2021   GLUCOSE 114 (H) 08/10/2021   BUN 14 08/10/2021   CREATININE 0.67 08/10/2021   BILITOT 0.2 08/10/2021   ALKPHOS 97 08/10/2021   AST 26 08/10/2021   ALT 28 08/10/2021   PROT 7.0 08/10/2021   ALBUMIN 4.5 08/10/2021   CALCIUM 9.3 08/10/2021   EGFR 104 08/10/2021   GFR 90.84 01/22/2020   Lab Results  Component Value Date   CHOL 293 (H) 08/10/2021   Lab Results  Component Value Date   HDL 61 08/10/2021   Lab Results  Component Value Date   LDLCALC 204 (H) 08/10/2021   Lab Results  Component Value Date   TRIG 153 (H) 08/10/2021   Lab Results  Component Value Date   CHOLHDL 4.8 (H) 08/10/2021   Lab Results  Component Value Date   HGBA1C 6.0 (H) 08/10/2021       Assessment & Plan:   Problem List Items Addressed This Visit     Neck mass -  Primary    Firm, non-tender, mobile 1.5-2cm round mass palpated along left jaw ling proximal to the ear consistent with tonsillar lymph node.  No signs of infection, erythema, or ENT abnormality noted today. No other alarm symptoms.  New medications include Ozempic x 1 week and rosuvastatin x 3 weeks at time first noticed.  Does have a history of lymph node enlargement of benign etiology, but this is new.  Etiology unclear at this time. There is concerns for rapid enlargement without any associated symptoms.  Highly doubt related to new medications, but recommend continue to hold Ozempic for now until we can further assess.  Will obtain CBC and [place] today for closer evaluation.  Instructions provided on warm compress and monitoring. We will determine f/u based on labs and imaging.       Relevant Orders   US Soft Tissue Head/Neck (NON-THYROID)   CBC with Differential/Platelet     No orders  of the defined types were placed in this encounter.    Orma Render, NP

## 2021-09-06 NOTE — Assessment & Plan Note (Signed)
Firm, non-tender, mobile 1.5-2cm round mass palpated along left jaw ling proximal to the ear consistent with tonsillar lymph node.  No signs of infection, erythema, or ENT abnormality noted today. No other alarm symptoms.  New medications include Ozempic x 1 week and rosuvastatin x 3 weeks at time first noticed.  Does have a history of lymph node enlargement of benign etiology, but this is new.  Etiology unclear at this time. There is concerns for rapid enlargement without any associated symptoms.  Highly doubt related to new medications, but recommend continue to hold Ozempic for now until we can further assess.  Will obtain CBC and [place] today for closer evaluation.  Instructions provided on warm compress and monitoring. We will determine f/u based on labs and imaging.

## 2021-09-06 NOTE — Patient Instructions (Signed)
This appears to be enlargement of the tonsillar lymph node, but the reasoning is unclear.   We will check your labs today to determine if there are any signs of infection present and also monitor blood counts that would give [place] an indication that the immune system is not functioning like it should. Your last labs looked good.   I also want to get an ultrasound of the area to see if this looks like a lymph node or possible cyst.   Hold off on the ozempic until we have the results back.

## 2021-09-07 ENCOUNTER — Ambulatory Visit
Admission: RE | Admit: 2021-09-07 | Discharge: 2021-09-07 | Disposition: A | Discharge status: Home / Self Care | Payer: 59 | Source: Ambulatory Visit | Attending: Nurse Practitioner | Admitting: Nurse Practitioner

## 2021-09-07 DIAGNOSIS — R221 Localized swelling, mass and lump, neck: Secondary | ICD-10-CM

## 2021-09-12 ENCOUNTER — Encounter (HOSPITAL_BASED_OUTPATIENT_CLINIC_OR_DEPARTMENT_OTHER): Payer: Self-pay | Admitting: Nurse Practitioner

## 2021-09-12 DIAGNOSIS — R599 Enlarged lymph nodes, unspecified: Secondary | ICD-10-CM

## 2021-09-12 DIAGNOSIS — R221 Localized swelling, mass and lump, neck: Secondary | ICD-10-CM

## 2021-09-21 ENCOUNTER — Other Ambulatory Visit: Payer: Self-pay

## 2021-09-21 ENCOUNTER — Ambulatory Visit
Admission: RE | Admit: 2021-09-21 | Discharge: 2021-09-21 | Disposition: A | Discharge status: Home / Self Care | Payer: 59 | Source: Ambulatory Visit | Attending: Nurse Practitioner | Admitting: Nurse Practitioner

## 2021-09-21 DIAGNOSIS — R221 Localized swelling, mass and lump, neck: Secondary | ICD-10-CM

## 2021-09-21 DIAGNOSIS — R599 Enlarged lymph nodes, unspecified: Secondary | ICD-10-CM

## 2021-09-21 MED ORDER — IOPAMIDOL (ISOVUE-300) INJECTION 61%
75.0000 mL | Freq: Once | INTRAVENOUS | Status: AC | PRN
  Administered 2021-09-21: 09:00:00 75 mL via INTRAVENOUS

## 2021-09-22 ENCOUNTER — Other Ambulatory Visit: Payer: 59

## 2021-09-23 ENCOUNTER — Other Ambulatory Visit (HOSPITAL_BASED_OUTPATIENT_CLINIC_OR_DEPARTMENT_OTHER): Payer: Self-pay | Admitting: Nurse Practitioner

## 2021-09-23 DIAGNOSIS — R590 Localized enlarged lymph nodes: Secondary | ICD-10-CM

## 2021-09-26 ENCOUNTER — Telehealth (HOSPITAL_BASED_OUTPATIENT_CLINIC_OR_DEPARTMENT_OTHER): Payer: Self-pay

## 2021-09-26 DIAGNOSIS — R221 Localized swelling, mass and lump, neck: Secondary | ICD-10-CM

## 2021-09-26 DIAGNOSIS — R590 Localized enlarged lymph nodes: Secondary | ICD-10-CM

## 2021-09-26 NOTE — Telephone Encounter (Signed)
Per recommendation of Worthy Keeler, DNP referral placed to [facility] Patient is aware

## 2021-09-26 NOTE — Telephone Encounter (Signed)
-----   Message from Orma Render, NP sent at 09/23/2021  7:10 PM EST ----- Recent CT of the soft tissue of the neck does show enlargement of the concerning lymph node that has enlarged since the previous study.  MyChart message sent to patient recommending referral to general surgery for evaluation and biopsy of the area.  I did notify the patient that I will be out of town until the ninth however if she has any questions please feel free to send them to me.

## 2021-10-03 ENCOUNTER — Encounter (HOSPITAL_BASED_OUTPATIENT_CLINIC_OR_DEPARTMENT_OTHER): Payer: Self-pay | Admitting: Nurse Practitioner

## 2021-10-03 DIAGNOSIS — R590 Localized enlarged lymph nodes: Secondary | ICD-10-CM

## 2021-10-03 DIAGNOSIS — R221 Localized swelling, mass and lump, neck: Secondary | ICD-10-CM

## 2021-10-12 ENCOUNTER — Other Ambulatory Visit (HOSPITAL_COMMUNITY): Payer: Self-pay | Admitting: Otolaryngology

## 2021-10-12 DIAGNOSIS — R221 Localized swelling, mass and lump, neck: Secondary | ICD-10-CM

## 2021-10-13 ENCOUNTER — Encounter (HOSPITAL_COMMUNITY): Payer: Self-pay | Admitting: Radiology

## 2021-10-13 NOTE — Progress Notes (Signed)
Patient Name  Maria Glenn, Maria Glenn Legal Sex  Female DOB  December 17, 1965 SSN  [id number] Address  [address]  [place] [address] Phone  [phone] [facility])  [phone] (Mobile) *Preferred*    RE: [place] FNA SOFT TISSUE Received: Today Arne Cleveland, MD  Arlyn Leak   [place] FNA L parotid nodule   DDH        Previous Messages   ----- Message -----  From: Garth Bigness D  Sent: 10/12/2021   5:16 PM EST  To: [facility] Procedure Requests  Subject: [place] FNA SOFT TISSUE                             Procedure:  [place] FNA SOFT TISSUE   Reason:  left neck mass    History:  CT, [place] in computer   Provider:  Leta Baptist   Provider Contact:  [phone]

## 2021-10-17 ENCOUNTER — Other Ambulatory Visit: Payer: Self-pay | Admitting: Radiology

## 2021-10-19 ENCOUNTER — Other Ambulatory Visit: Payer: Self-pay | Admitting: Student

## 2021-10-19 ENCOUNTER — Ambulatory Visit (HOSPITAL_COMMUNITY)
Admission: RE | Admit: 2021-10-19 | Discharge: 2021-10-19 | Disposition: A | Discharge status: Home / Self Care | Payer: 59 | Source: Ambulatory Visit | Attending: Otolaryngology | Admitting: Otolaryngology

## 2021-10-19 ENCOUNTER — Other Ambulatory Visit: Payer: Self-pay

## 2021-10-19 ENCOUNTER — Encounter (HOSPITAL_COMMUNITY): Payer: Self-pay

## 2021-10-19 VITALS — BP 109/78 | HR 65 | Temp 98.3°F | Resp 12

## 2021-10-19 DIAGNOSIS — R221 Localized swelling, mass and lump, neck: Secondary | ICD-10-CM | POA: Insufficient documentation

## 2021-10-19 LAB — CBC WITH DIFFERENTIAL/PLATELET
Abs Immature Granulocytes: 0.02 10*3/uL (ref 0.00–0.07)
Basophils Absolute: 0.1 10*3/uL (ref 0.0–0.1)
Basophils Relative: 1 %
Eosinophils Absolute: 0.3 10*3/uL (ref 0.0–0.5)
Eosinophils Relative: 5 %
HCT: 39.1 % (ref 36.0–46.0)
Hemoglobin: 13 g/dL (ref 12.0–15.0)
Immature Granulocytes: 0 %
Lymphocytes Relative: 39 %
Lymphs Abs: 2.7 10*3/uL (ref 0.7–4.0)
MCH: 28.3 pg (ref 26.0–34.0)
MCHC: 33.2 g/dL (ref 30.0–36.0)
MCV: 85 fL (ref 80.0–100.0)
Monocytes Absolute: 0.5 10*3/uL (ref 0.1–1.0)
Monocytes Relative: 7 %
Neutro Abs: 3.4 10*3/uL (ref 1.7–7.7)
Neutrophils Relative %: 48 %
Platelets: 215 10*3/uL (ref 150–400)
RBC: 4.6 MIL/uL (ref 3.87–5.11)
RDW: 13.6 % (ref 11.5–15.5)
WBC: 7.1 10*3/uL (ref 4.0–10.5)
nRBC: 0 % (ref 0.0–0.2)

## 2021-10-19 LAB — BASIC METABOLIC PANEL
Anion gap: 8 (ref 5–15)
BUN: 17 mg/dL (ref 6–20)
CO2: 24 mmol/L (ref 22–32)
Calcium: 9.7 mg/dL (ref 8.9–10.3)
Chloride: 106 mmol/L (ref 98–111)
Creatinine, Ser: 0.68 mg/dL (ref 0.44–1.00)
GFR, Estimated: >60 mL/min (ref >60)
Glucose, Bld: 128 mg/dL — ABNORMAL HIGH (ref 70–99)
Potassium: 4.1 mmol/L (ref 3.5–5.1)
Sodium: 138 mmol/L (ref 135–145)

## 2021-10-19 LAB — PROTIME-INR
INR: 0.9 (ref 0.8–1.2)
Prothrombin Time: 12.6 seconds (ref 11.4–15.2)

## 2021-10-19 LAB — PREGNANCY, URINE: Preg Test, Ur: NEGATIVE

## 2021-10-19 MED ORDER — LIDOCAINE HCL (PF) 1 % IJ SOLN
INTRAMUSCULAR | Status: AC | PRN
  Administered 2021-10-19: 5 mL via INTRADERMAL

## 2021-10-19 MED ORDER — SODIUM CHLORIDE 0.9 % IV SOLN: INTRAVENOUS | Status: DC

## 2021-10-19 MED ORDER — FENTANYL CITRATE (PF) 100 MCG/2ML IJ SOLN
INTRAMUSCULAR | Status: AC | PRN
  Administered 2021-10-19: 50 ug via INTRAVENOUS

## 2021-10-19 MED ORDER — LIDOCAINE HCL 1 % IJ SOLN
INTRAMUSCULAR | Status: AC
  Administered 2021-10-19: 14:00:00 5 mL
  Filled 2021-10-19: qty 20

## 2021-10-19 MED ORDER — MIDAZOLAM HCL 2 MG/2ML IJ SOLN
INTRAMUSCULAR | Status: AC | PRN
  Administered 2021-10-19: 1 mg via INTRAVENOUS

## 2021-10-19 MED ORDER — FENTANYL CITRATE (PF) 100 MCG/2ML IJ SOLN
INTRAMUSCULAR | Status: AC
  Filled 2021-10-19: qty 2

## 2021-10-19 MED ORDER — MIDAZOLAM HCL 2 MG/2ML IJ SOLN
INTRAMUSCULAR | Status: AC
  Filled 2021-10-19: qty 2

## 2021-10-19 NOTE — Consult Note (Signed)
Chief Complaint: Patient was seen in consultation today for US guided left parotid nodule biopsy  Referring Physician(s): Teoh,Su  Supervising Physician: Markus Daft  Patient Status: [facility] - Out-pt  History of Present Illness: Maria Glenn is a 56 y.o. female nonsmoker  with PMH sig for B12 deficiency, obesity, factor V Leiden , remote LLE DVT, HLD, prediabetes, hypothyroidism, HLD, vit D deficiency. She now presents with about  month and a half long left neck swelling and imaging findings of : 1. Enlarged, rounded lymph node in the tail of the left parotid, which likely correlates with the reported palpable abnormality and the enlarged lymph node seen on the 09/07/2021 ultrasound. This lymph node is increased in size compared to 02/05/2020. Given somewhat abnormal morphology and increased size, sampling is recommended. 2. Additional prominent right level 3 and left level 2A nodes, which have increased in prominence compared to the prior exam   She denies hx cancer. She is scheduled today for image guided biopsy of left parotid nodule/adenopathy.      Past Medical History:  Diagnosis Date   Allergy    B12 deficiency    Class 2 severe obesity with serious comorbidity and body mass index (BMI) of 39.0 to 39.9 in adult [facility]) 06/09/2020   Clotting disorder ([place])    factor V Leiden   DVT (deep venous thrombosis) (HCC)    Factor V Leiden ([place])    History of blood clots    History of DVT (deep vein thrombosis) 08/09/2015   Hyperlipidemia    Hypothyroidism    Left leg DVT ([place]) 08/09/2015   Other fatigue 12/07/2020   Other hyperlipidemia 12/07/2020   Other specified hypothyroidism 12/07/2020   Prediabetes    SOBOE (shortness of breath on exertion)    SOBOE (shortness of breath on exertion) 12/07/2020   Thyroid disease    Trigeminal neuralgia    Visit for preventive health examination 11/30/2016   Vitamin D deficiency     Past Surgical History:  Procedure Laterality Date    CESAREAN SECTION      Allergies: Penicillins  Medications: Prior to Admission medications   Medication Sig Start Date End Date Taking? Authorizing Provider  fexofenadine (ALLEGRA) 180 MG tablet Take 1 tablet (180 mg total) by mouth daily. 01/17/21  Yes Opalski, Neoma Laming, DO  rosuvastatin (CRESTOR) 10 MG tablet Take 1 tablet (10 mg total) by mouth daily. 08/16/21  Yes Early, Coralee Pesa, NP  SYNTHROID 88 MCG tablet Take 1 tablet by mouth once daily 09/06/21  Yes Early, Coralee Pesa, NP  aspirin 81 MG chewable tablet Chew 81 mg by mouth daily.    [provider]  Insulin Pen Needle (PEN NEEDLES) 32G X 4 MM MISC Use one new needle per week with injection of Ozempic 08/16/21   Early, Coralee Pesa, NP  Semaglutide, 1 MG/DOSE, 4 MG/3ML SOPN Inject 1 mg as directed once a week. Pen 3- use after completion of Pen 2. 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  Tobacco Use   Smoking status: Never   Smokeless tobacco: Never  Vaping Use   Vaping Use: Never used  Substance and Sexual Activity   Alcohol use: No    Alcohol/week: 0.0 standard drinks   Drug use: No   Sexual activity: Yes    Birth control/protection: Other-see comments    Comment: husband had vasectomy  Other Topics Concern   Not on file  Social History Narrative   Right handed   Two story home   Drinks caffeine   Social Determinants of Health   Financial Resource Strain: Not on file  Food Insecurity: Not on file  Transportation Needs: Not  on file  Physical Activity: Not on file  Stress: Not on file  Social Connections: Not on file      Review of Systems denies fever,HA,CP,dyspnea, cough, abd /back pain,N/V or bleeding  Vital Signs: BP 140/81    Pulse 78    Temp 98.1 F (36.7 C) (Oral)    Resp 15    LMP 08/25/2021 (Approximate)    SpO2 98%   Physical Exam awake/alert; chest- CTA bilat; heart- RRR; abd- soft,+BS,NT; no LE edema; firm nodularity left upper neck /parotid region,NT  Imaging: CT SOFT TISSUE NECK W CONTRAST  Result Date: 09/21/2021 CLINICAL DATA:  Enlarged lymph nodes, left neck EXAM: CT NECK WITH CONTRAST TECHNIQUE: Multidetector CT imaging of the neck was performed using the standard protocol following the bolus administration of intravenous contrast. CONTRAST:  69mL ISOVUE-300 IOPAMIDOL (ISOVUE-300) INJECTION 61% COMPARISON:  02/05/2020 CT neck, 09/07/2021 ultrasound of the neck. FINDINGS: Pharynx and larynx: Normal. No mass or swelling. Salivary glands: No inflammation, mass, or stone. Thyroid: Normal. Lymph nodes: In the left parotid tail, slightly superior to the marker, there is an enlarged, rounded lymph node that measures 1.2 x 1.1 x 1.2 cm (AP x TR x CC) (series 3, image 37 and series 6, image 55) this lymph node likely measured up to 0.3 cm on the prior exam. This likely correlates with the largest lymph node seen on the 09/07/2021 ultrasound, with additional slightly more inferior (level 2A) lymph nodes measuring up to 0.4 and 0.3 cm in short axis (series 3, images 40 and 43, respectively) correlating with the other lymph nodes seen on that exam, slightly increased in size from the prior exam. Mildly rounded right level 3 lymph node, which measures 0.6 x 1.0 x 1.1 cm (series 3, image 51 and series 6, image 50), previously 0.6 x 0.5 x 1.1 cm. No other lymphadenopathy in the neck. The previously described lymph node in the posterior left neck is unchanged, measuring 0.4 cm in short axis (series 7, image 36).  Vascular: Patent. Limited intracranial: Negative. Visualized orbits: Negative. Mastoids and visualized paranasal sinuses: Clear. Skeleton: No acute osseous abnormality. Upper chest: Negative. Other: None. IMPRESSION: 1. Enlarged, rounded lymph node in the tail of the left parotid, which likely correlates with the reported palpable abnormality and the enlarged lymph node seen on the 09/07/2021 ultrasound. This lymph node is increased in size compared to 02/05/2020. Given somewhat abnormal morphology and increased size, sampling is recommended. 2. Additional prominent right level 3 and left level 2A nodes, which have increased in prominence compared to the prior exam. 3. No other acute abnormality in the neck. Electronically Signed   By:  Merilyn Baba M.D.   On: 09/21/2021 15:36    Labs:  CBC: Recent Labs    12/07/20 1012 08/10/21 0933 09/06/21 1110 10/19/21 1112  WBC 8.5 8.0 7.1 7.1  HGB 12.7 13.0 14.0 13.0  HCT 38.8 39.6 43.2 39.1  PLT 284 242 244 215    COAGS: Recent Labs    10/19/21 1112  INR 0.9    BMP: Recent Labs    12/07/20 1012 08/10/21 0933 10/19/21 1112  NA 140 140 138  K 4.5 4.7 4.1  CL 98 102 106  CO2 23 18* 24  GLUCOSE 110* 114* 128*  BUN 10 14 17   CALCIUM 9.1 9.3 9.7  CREATININE 0.68 0.67 0.68  GFRNONAA  --   --  >60    LIVER FUNCTION TESTS: Recent Labs    12/07/20 1012 08/10/21 0933  BILITOT 0.2 0.2  AST 13 26  ALT 18 28  ALKPHOS 98 97  PROT 7.1 7.0  ALBUMIN 4.0 4.5    TUMOR MARKERS: No results for input(s): AFPTM, CEA, CA199, CHROMGRNA in the last 8760 hours.  Assessment and Plan: 56 y.o. female nonsmoker  with PMH sig for B12 deficiency, obesity, factor V Leiden , remote LLE DVT, HLD, prediabetes, hypothyroidism, HLD, vit D deficiency. She now presents with about  month and a half long left neck swelling and imaging findings of : 1. Enlarged, rounded lymph node in the tail of the left parotid, which likely correlates with the reported  palpable abnormality and the enlarged lymph node seen on the 09/07/2021 ultrasound. This lymph node is increased in size compared to 02/05/2020. Given somewhat abnormal morphology and increased size, sampling is recommended. 2. Additional prominent right level 3 and left level 2A nodes, which have increased in prominence compared to the prior exam   She denies hx cancer. She is scheduled today for image guided biopsy of left parotid nodule/adenopathy. Risks and benefits of procedure was discussed with the patient including, but not limited to bleeding, infection, damage to adjacent structures or low yield requiring additional tests.  All of the questions were answered and there is agreement to proceed.  Consent signed and in chart.     Thank you for this interesting consult.  I greatly enjoyed meeting Maria Glenn and look forward to participating in their care.  A copy of this report was sent to the requesting provider on this date.  Electronically Signed: D. Rowe Robert, PA-C 10/19/2021, 12:04 PM   I spent a total of  20 minutes   in face to face in clinical consultation, greater than 50% of which was counseling/coordinating care for image guided left parotid nodule biopsy

## 2021-10-21 LAB — SURGICAL PATHOLOGY

## 2021-11-08 ENCOUNTER — Encounter (HOSPITAL_BASED_OUTPATIENT_CLINIC_OR_DEPARTMENT_OTHER): Payer: Self-pay | Admitting: Nurse Practitioner

## 2021-11-08 ENCOUNTER — Ambulatory Visit (HOSPITAL_BASED_OUTPATIENT_CLINIC_OR_DEPARTMENT_OTHER): Payer: 59 | Admitting: Nurse Practitioner

## 2021-11-08 ENCOUNTER — Other Ambulatory Visit (HOSPITAL_BASED_OUTPATIENT_CLINIC_OR_DEPARTMENT_OTHER): Payer: Self-pay

## 2021-11-08 ENCOUNTER — Other Ambulatory Visit: Payer: Self-pay

## 2021-11-08 VITALS — BP 128/88 | HR 85

## 2021-11-08 DIAGNOSIS — R7303 Prediabetes: Secondary | ICD-10-CM | POA: Diagnosis not present

## 2021-11-08 DIAGNOSIS — Z9189 Other specified personal risk factors, not elsewhere classified: Secondary | ICD-10-CM | POA: Diagnosis not present

## 2021-11-08 DIAGNOSIS — E782 Mixed hyperlipidemia: Secondary | ICD-10-CM | POA: Diagnosis not present

## 2021-11-08 DIAGNOSIS — Z6838 Body mass index (BMI) 38.0-38.9, adult: Secondary | ICD-10-CM

## 2021-11-08 DIAGNOSIS — R221 Localized swelling, mass and lump, neck: Secondary | ICD-10-CM

## 2021-11-08 MED ORDER — SEMAGLUTIDE (1 MG/DOSE) 4 MG/3ML ~~LOC~~ SOPN
1.0000 mg | PEN_INJECTOR | SUBCUTANEOUS | 4 refills | Status: DC
  Filled 2021-11-08: qty 3, 28d supply, fill #0

## 2021-11-08 MED ORDER — OZEMPIC (0.25 OR 0.5 MG/DOSE) 2 MG/1.5ML ~~LOC~~ SOPN
PEN_INJECTOR | SUBCUTANEOUS | 1 refills | Status: DC
  Filled 2021-11-08: qty 1.5, 28d supply, fill #0
  Filled 2021-12-08: qty 1.5, 28d supply, fill #1
  Filled 2022-01-13: qty 1.5, 28d supply, fill #2

## 2021-11-08 NOTE — Patient Instructions (Signed)
We will plan to start the Ozempic at 0.25mg  for 4 weeks then increase to the 0.5mg . You can either do this dose for 6 weeks or 8 weeks. If you do 8 weeks, the last two doses will need to be given on the same day to equal 1mg .

## 2021-11-11 ENCOUNTER — Encounter (HOSPITAL_BASED_OUTPATIENT_CLINIC_OR_DEPARTMENT_OTHER): Payer: Self-pay | Admitting: Nurse Practitioner

## 2021-11-11 NOTE — Assessment & Plan Note (Signed)
See Obesity.

## 2021-11-11 NOTE — Assessment & Plan Note (Signed)
Chronic. Efforts to loose weight have been unsuccessful with diet and exercise alone. Discussed medication options and patient is interested in semaglutide for management.  Most recent A1c 6.0% with lab evidence of insulin resistance, which is likely causing difficulty in her efforts.  Will plan to start ozempic at 0.25mg  dose and taper as usual. Consider remaining at 1mg  dose if blood sugar and weight control are achieved and taper up if stagnation develops.  Will plan to follow-up in 3 months or sooner if needed.

## 2021-11-11 NOTE — Assessment & Plan Note (Signed)
Not currently on statin therapy.  Will work with patient to obtain lipid control through diet and activity as well as medication management.  Will plan for labs in 3 months.

## 2021-11-11 NOTE — Progress Notes (Signed)
Established Patient Office Visit  Subjective:  Patient ID: Maria Glenn, female    DOB: 1965/11/20  Age: 56 y.o. MRN: [id number]  CC:  Chief Complaint  Patient presents with   Obesity    HPI Maria Glenn presents for weight management and pre-dm.  Maria Glenn endorses difficulty losing weight with diet and exercise alone. She reports increased hunger and sugar cravings are present. She was prescribed Ozempic in the past and had to stop the medication due to neck mass requiring biopsy. This was found to be benign and she would like to restart the medication, if possible. She does have a hx of prediabetes, elevated lipids, and hyperglycemia. She would like to ensure better health for her future.    Outpatient Medications Prior to Visit  Medication Sig Dispense Refill   aspirin 81 MG chewable tablet Chew 81 mg by mouth daily.     fexofenadine (ALLEGRA) 180 MG tablet Take 1 tablet (180 mg total) by mouth daily. 90 tablet 3   Insulin Pen Needle (PEN NEEDLES) 32G X 4 MM MISC Use one new needle per week with injection of Ozempic 30 each 11   rosuvastatin (CRESTOR) 10 MG tablet Take 1 tablet (10 mg total) by mouth daily. 90 tablet 3   Semaglutide, 1 MG/DOSE, 4 MG/3ML SOPN Inject 1 mg as directed once a week. Pen 3- use after completion of Pen 2. 3 mL 0   SYNTHROID 88 MCG tablet Take 1 tablet by mouth once daily 30 tablet 6   No facility-administered medications prior to visit.    Allergies  Allergen Reactions   Penicillins Shortness Of Breath    ROS Review of Systems All review of systems negative except what is listed in the HPI    Objective:    Physical Exam Vitals and nursing note reviewed.  Constitutional:      Appearance: Normal appearance. She is obese.  HENT:     Head: Normocephalic.  Eyes:     Extraocular Movements: Extraocular movements intact.     Conjunctiva/sclera: Conjunctivae normal.     Pupils: Pupils are equal, round, and reactive to light.  Neck:      Vascular: No carotid bruit.  Cardiovascular:     Rate and Rhythm: Normal rate and regular rhythm.     Pulses: Normal pulses.     Heart sounds: Normal heart sounds.  Pulmonary:     Effort: Pulmonary effort is normal.     Breath sounds: Normal breath sounds.  Musculoskeletal:     Cervical back: Normal range of motion.     Right lower leg: No edema.     Left lower leg: No edema.  Skin:    General: Skin is warm and dry.     Capillary Refill: Capillary refill takes less than 2 seconds.  Neurological:     General: No focal deficit present.     Mental Status: She is alert and oriented to person, place, and time.  Psychiatric:        Mood and Affect: Mood normal.        Behavior: Behavior normal.        Thought Content: Thought content normal.        Judgment: Judgment normal.    There were no vitals taken for this visit. Wt Readings from Last 3 Encounters:  09/06/21 245 lb (111.1 kg)  08/10/21 245 lb (111.1 kg)  04/20/21 219 lb (99.3 kg)     Assessment & Plan:   Problem List  Items Addressed This Visit     Class 2 severe obesity with serious comorbidity and body mass index (BMI) of 38.0 to 38.9 in adult, unspecified obesity type (HCC)    Chronic. Efforts to loose weight have been unsuccessful with diet and exercise alone. Discussed medication options and patient is interested in semaglutide for management.  Most recent A1c 6.0% with lab evidence of insulin resistance, which is likely causing difficulty in her efforts.  Will plan to start ozempic at 0.25mg  dose and taper as usual. Consider remaining at 1mg  dose if blood sugar and weight control are achieved and taper up if stagnation develops.  Will plan to follow-up in 3 months or sooner if needed.       Relevant Medications   Semaglutide,0.25 or 0.5MG /DOS, (OZEMPIC, 0.25 OR 0.5 MG/DOSE,) 2 MG/1.5ML SOPN   Semaglutide, 1 MG/DOSE, 4 MG/3ML SOPN   Prediabetes - Primary    A1c 6.0% Given significant obesity and historical HLD, I  do feel that aggressive treatments are necessary to help achieve weight loss and subsequent controlled blood glucose levels to prevent risks of CV disease and other complications associated with DM progression.  Will begin tx with 2.5mg  and plan to taper as usual. May consider stopping at 1mg  dose if BG control and weight management are achieved. May continue taper if medication becomes ineffective.  F/U in 3 months or sooner if needed.       Relevant Medications   Semaglutide,0.25 or 0.5MG /DOS, (OZEMPIC, 0.25 OR 0.5 MG/DOSE,) 2 MG/1.5ML SOPN   Semaglutide, 1 MG/DOSE, 4 MG/3ML SOPN   At risk for heart disease    See Obesity.       Relevant Medications   Semaglutide,0.25 or 0.5MG /DOS, (OZEMPIC, 0.25 OR 0.5 MG/DOSE,) 2 MG/1.5ML SOPN   Semaglutide, 1 MG/DOSE, 4 MG/3ML SOPN   Moderate mixed hyperlipidemia not requiring statin therapy    Not currently on statin therapy.  Will work with patient to obtain lipid control through diet and activity as well as medication management.  Will plan for labs in 3 months.       Relevant Medications   Semaglutide,0.25 or 0.5MG /DOS, (OZEMPIC, 0.25 OR 0.5 MG/DOSE,) 2 MG/1.5ML SOPN   Semaglutide, 1 MG/DOSE, 4 MG/3ML SOPN   Neck mass    Biopsy benign with no concerns at this time.  Recommend continued surveillance and notify if any changes are noted.       At risk for impaired metabolic function   Relevant Medications   Semaglutide,0.25 or 0.5MG /DOS, (OZEMPIC, 0.25 OR 0.5 MG/DOSE,) 2 MG/1.5ML SOPN   Semaglutide, 1 MG/DOSE, 4 MG/3ML SOPN    Meds ordered this encounter  Medications   Semaglutide,0.25 or 0.5MG /DOS, (OZEMPIC, 0.25 OR 0.5 MG/DOSE,) 2 MG/1.5ML SOPN    Sig: Inject 0.25 mg into the skin once a week for 28 days, THEN 0.5 mg once a week.    Dispense:  3 mL    Refill:  1   Semaglutide, 1 MG/DOSE, 4 MG/3ML SOPN    Sig: Inject 1 mg as directed once a week.    Dispense:  3 mL    Refill:  4    Follow-up: Return in about 3 months (around  02/05/2022) for f/u ozempic.    Orma Render, NP

## 2021-11-11 NOTE — Assessment & Plan Note (Signed)
A1c 6.0% Given significant obesity and historical HLD, I do feel that aggressive treatments are necessary to help achieve weight loss and subsequent controlled blood glucose levels to prevent risks of CV disease and other complications associated with DM progression.  Will begin tx with 2.5mg  and plan to taper as usual. May consider stopping at 1mg  dose if BG control and weight management are achieved. May continue taper if medication becomes ineffective.  F/U in 3 months or sooner if needed.

## 2021-11-11 NOTE — Assessment & Plan Note (Signed)
Biopsy benign with no concerns at this time.  Recommend continued surveillance and notify if any changes are noted.

## 2021-12-08 ENCOUNTER — Other Ambulatory Visit (HOSPITAL_BASED_OUTPATIENT_CLINIC_OR_DEPARTMENT_OTHER): Payer: Self-pay

## 2021-12-08 ENCOUNTER — Telehealth (HOSPITAL_BASED_OUTPATIENT_CLINIC_OR_DEPARTMENT_OTHER): Payer: Self-pay

## 2021-12-09 ENCOUNTER — Other Ambulatory Visit (HOSPITAL_BASED_OUTPATIENT_CLINIC_OR_DEPARTMENT_OTHER): Payer: Self-pay | Admitting: Nurse Practitioner

## 2021-12-09 ENCOUNTER — Other Ambulatory Visit (HOSPITAL_BASED_OUTPATIENT_CLINIC_OR_DEPARTMENT_OTHER): Payer: Self-pay

## 2021-12-09 DIAGNOSIS — R110 Nausea: Secondary | ICD-10-CM

## 2021-12-09 MED ORDER — ONDANSETRON HCL 8 MG PO TABS
8.0000 mg | ORAL_TABLET | Freq: Three times a day (TID) | ORAL | 2 refills | Status: DC | PRN
  Filled 2021-12-09: qty 20, 7d supply, fill #0

## 2021-12-09 NOTE — Telephone Encounter (Signed)
Left msg for patient advising medication sent to pharmacy. AS, CMA ?

## 2022-01-13 ENCOUNTER — Other Ambulatory Visit (HOSPITAL_BASED_OUTPATIENT_CLINIC_OR_DEPARTMENT_OTHER): Payer: Self-pay

## 2022-02-06 ENCOUNTER — Encounter (HOSPITAL_BASED_OUTPATIENT_CLINIC_OR_DEPARTMENT_OTHER): Payer: Self-pay | Admitting: Nurse Practitioner

## 2022-02-06 ENCOUNTER — Other Ambulatory Visit (HOSPITAL_BASED_OUTPATIENT_CLINIC_OR_DEPARTMENT_OTHER): Payer: Self-pay

## 2022-02-06 ENCOUNTER — Ambulatory Visit (HOSPITAL_BASED_OUTPATIENT_CLINIC_OR_DEPARTMENT_OTHER): Payer: 59 | Admitting: Nurse Practitioner

## 2022-02-06 VITALS — BP 122/88 | HR 93 | Ht 66.0 in | Wt 244.9 lb

## 2022-02-06 DIAGNOSIS — R7303 Prediabetes: Secondary | ICD-10-CM | POA: Diagnosis not present

## 2022-02-06 DIAGNOSIS — D6851 Activated protein C resistance: Secondary | ICD-10-CM

## 2022-02-06 DIAGNOSIS — I82532 Chronic embolism and thrombosis of left popliteal vein: Secondary | ICD-10-CM

## 2022-02-06 DIAGNOSIS — N951 Menopausal and female climacteric states: Secondary | ICD-10-CM

## 2022-02-06 DIAGNOSIS — E782 Mixed hyperlipidemia: Secondary | ICD-10-CM

## 2022-02-06 DIAGNOSIS — Z6838 Body mass index (BMI) 38.0-38.9, adult: Secondary | ICD-10-CM | POA: Diagnosis not present

## 2022-02-06 DIAGNOSIS — Z6839 Body mass index (BMI) 39.0-39.9, adult: Secondary | ICD-10-CM

## 2022-02-06 DIAGNOSIS — Z9189 Other specified personal risk factors, not elsewhere classified: Secondary | ICD-10-CM

## 2022-02-06 DIAGNOSIS — Z780 Asymptomatic menopausal state: Secondary | ICD-10-CM | POA: Insufficient documentation

## 2022-02-06 DIAGNOSIS — E039 Hypothyroidism, unspecified: Secondary | ICD-10-CM

## 2022-02-06 MED ORDER — SEMAGLUTIDE (1 MG/DOSE) 4 MG/3ML ~~LOC~~ SOPN
1.0000 mg | PEN_INJECTOR | SUBCUTANEOUS | 4 refills | Status: DC
  Filled 2022-02-06: qty 3, 28d supply, fill #0
  Filled 2022-03-15: qty 3, 28d supply, fill #1
  Filled 2022-04-11: qty 3, 28d supply, fill #2

## 2022-02-06 NOTE — Progress Notes (Signed)
?Worthy Keeler, DNP, AGNP-c ?[facility] ?[address] ?[address] ?[place], [place] [address] ?[phone] Office [phone] Fax ? ?ESTABLISHED PATIENT- Chronic Health and/or Follow-Up Visit ? ?Blood pressure 122/88, pulse 93, height '5\' 6"'$  (1.676 m), weight 244 lb 14.4 oz (111.1 kg), SpO2 98 %. ? ?Hyperglycemia and Obesity ? ? ?HPI ? ?Maria Glenn  is a 56 y.o. year old female presenting today for evaluation and management of the following: ?Pre-diabetes and Weight ?Three month F/U  ?Not having any weight loss with the 0.'5mg'$   ?Is having some mild nausea ?Not working out ?Cravings are not reduced  ?She is getting more full with smaller portions ?Still eating unhealthy options ?Hot flashes ?"All the time" ?She reports they are terrible at night ?Not having periods since November ?Would like to know if Black Cohosh is a safe option ? ?ROS ?All ROS negative with exception of what is listed in HPI ? ?PHYSICAL EXAM ?Physical Exam ?Vitals and nursing note reviewed.  ?Constitutional:   ?   General: She is not in acute distress. ?   Appearance: Normal appearance.  ?HENT:  ?   Head: Normocephalic.  ?Eyes:  ?   Extraocular Movements: Extraocular movements intact.  ?   Conjunctiva/sclera: Conjunctivae normal.  ?   Pupils: Pupils are equal, round, and reactive to light.  ?Neck:  ?   Vascular: No carotid bruit.  ?Cardiovascular:  ?   Rate and Rhythm: Normal rate and regular rhythm.  ?   Pulses: Normal pulses.  ?   Heart sounds: Normal heart sounds. No murmur heard. ?Pulmonary:  ?   Effort: Pulmonary effort is normal.  ?   Breath sounds: Normal breath sounds. No wheezing.  ?Abdominal:  ?   General: Bowel sounds are normal. There is no distension.  ?   Palpations: Abdomen is soft.  ?   Tenderness: There is no abdominal tenderness. There is no guarding.  ?Musculoskeletal:     ?   General: Normal range of motion.  ?   Cervical back: Normal range of motion and neck supple.  ?   Right lower  leg: No edema.  ?   Left lower leg: No edema.  ?Lymphadenopathy:  ?   Cervical: No cervical adenopathy.  ?Skin: ?   General: Skin is warm and dry.  ?   Capillary Refill: Capillary refill takes less than 2 seconds.  ?Neurological:  ?   General: No focal deficit present.  ?   Mental Status: She is alert and oriented to person, place, and time.  ?Psychiatric:     ?   Mood and Affect: Mood normal.     ?   Behavior: Behavior normal.     ?   Thought Content: Thought content normal.     ?   Judgment: Judgment normal.  ? ? ?ASSESSMENT & PLAN ?Problem List Items Addressed This Visit   ? ? Heterozygous factor V Leiden mutation ([place]) (Chronic)  ?  Chronic. No alarm sx present at this time. Labs today. Will monitor.  ? ?  ?  ? Acquired hypothyroidism  ?  Chronic. Labs today. No alarm sx present at this time. Continue current regimen.  ? ?  ?  ? Relevant Orders  ? TSH  ? T4, free  ? Class 2 severe obesity due to excess calories with serious comorbidity and body mass index (BMI) of 39.0 to 39.9 in adult [facility])  ?  BMI 39.53 Encourage diet and activity management to  help reduce risks and improve overall health status.  ?Will increase Ozempic dosing today to '1mg'$  to see if we can get improved control. Labs today.  ? ?  ?  ? Relevant Medications  ? Semaglutide, 1 MG/DOSE, 4 MG/3ML SOPN  ? Prediabetes - Primary  ?  Chronic. Labs today. No significant changes with Ozempic 0.'5mg'$  weekly dosing. Will increase dose to '1mg'$  daily and monitor. Patient encouraged to reach out if she feels the dose is not effective.  ?Encourage adherence to diet and exercise to help with overall management of BG and weight.  ? ?  ?  ? Relevant Medications  ? Semaglutide, 1 MG/DOSE, 4 MG/3ML SOPN  ? Other Relevant Orders  ? CBC with Differential/Platelet  ? Comprehensive metabolic panel  ? Hemoglobin A1c  ? Lipid panel  ? RESOLVED: Moderate mixed hyperlipidemia not requiring statin therapy  ? Relevant Medications  ? Semaglutide, 1 MG/DOSE, 4 MG/3ML SOPN  ? Other  Relevant Orders  ? CBC with Differential/Platelet  ? Comprehensive metabolic panel  ? Hemoglobin A1c  ? Lipid panel  ? Chronic deep vein thrombosis (DVT) of popliteal vein of left lower extremity (HCC)  ?  Chronic. Daily ASA. No alarm sx at this time. Stable at this time. Will continue to monitor.  ? ?  ?  ? Vasomotor symptoms due to menopause  ?  Discussion with patient today on recommendations. At this time appears that [facility] may decrease the absorption of statin medication. Recommend taking at opposite time of day for best results and avoidance of interference. She will plan to start this and follow-up if no improvement with use.  ? ?  ?  ? At risk for heart disease  ? Relevant Medications  ? Semaglutide, 1 MG/DOSE, 4 MG/3ML SOPN  ? At risk for impaired metabolic function  ? Relevant Medications  ? Semaglutide, 1 MG/DOSE, 4 MG/3ML SOPN  ? ?Other Visit Diagnoses   ? ? BMI 38.0-38.9,adult      ? Relevant Orders  ? CBC with Differential/Platelet  ? Comprehensive metabolic panel  ? Hemoglobin A1c  ? Lipid panel  ? ?  ? ? ? ?FOLLOW-UP ?Return in about 6 months (around 08/09/2022) for HLD, Weight, Pre-DM. ? ?Worthy Keeler, DNP, AGNP-c ?

## 2022-02-06 NOTE — Assessment & Plan Note (Signed)
Chronic. Daily ASA. No alarm sx at this time. Stable at this time. Will continue to monitor.  ?

## 2022-02-06 NOTE — Assessment & Plan Note (Signed)
Chronic. Labs today. No significant changes with Ozempic 0.'5mg'$  weekly dosing. Will increase dose to '1mg'$  daily and monitor. Patient encouraged to reach out if she feels the dose is not effective.  ?Encourage adherence to diet and exercise to help with overall management of BG and weight.  ?

## 2022-02-06 NOTE — Assessment & Plan Note (Addendum)
BMI 39.53 Encourage diet and activity management to help reduce risks and improve overall health status.  ?Will increase Ozempic dosing today to '1mg'$  to see if we can get improved control. Labs today.  ?

## 2022-02-06 NOTE — Assessment & Plan Note (Signed)
Discussion with patient today on recommendations. At this time appears that [facility] may decrease the absorption of statin medication. Recommend taking at opposite time of day for best results and avoidance of interference. She will plan to start this and follow-up if no improvement with use.  ?

## 2022-02-06 NOTE — Assessment & Plan Note (Signed)
Chronic. Labs today. No alarm sx present at this time. Continue current regimen.  ?

## 2022-02-06 NOTE — Patient Instructions (Signed)
It was a pleasure seeing you today. I hope your time spent with [place] was pleasant and helpful. Please let us know if there is anything we can do to improve the service you receive.  ? ?Today we discussed concerns with: ? ?Prediabetes ? ?Class 2 severe obesity with serious comorbidity and body mass index (BMI) of 38.0 to 38.9 in adult, unspecified obesity type ([place]) ? ?Moderate mixed hyperlipidemia not requiring statin therapy ? ?BMI 38.0-38.9,adult ? ?Acquired hypothyroidism ? ?You may try the black cohosh for the hot flashes. This at the opposite time of the day of the rosuvastatin to allow the rosuvastatin to work best. Dennis Bast can start with '40mg'$  up to '80mg'$  ? ?The following orders have been placed for you today: ? ?No orders of the defined types were placed in this encounter. ? ? ? ?Important Office Information ?Lab Results ?If labs were ordered, please note that you will see results through [place] as soon as they come available from [facility].  ?It takes up to 5 business days for the results to be routed to me and for me to review them once all of the lab results have come through from [facility]. I will make recommendations based on your results and send these through [place] or someone from the office will call you to discuss. If your labs are abnormal, we may contact you to schedule a visit to discuss the results and make recommendations.  ?If you have not heard from [place] within 5 business days or you have waited longer than a week and your lab results have not come through on [place], please feel free to call the office or send a message through [place] to follow-up on these labs.  ? ?Referrals ?If referrals were placed today, the office where the referral was sent will contact you either by phone or through [place] to set up scheduling. Please note that it can take up to a week for the referral office to contact you. If you do not hear from them in a week, please contact the referral office directly to inquire about  scheduling.  ? ?Condition Treated ?If your condition worsens or you begin to have new symptoms, please schedule a follow-up appointment for further evaluation. If you are not sure if an appointment is needed, you may call the office to leave a message for the nurse and someone will contact you with recommendations.  ?If you have an urgent or life threatening emergency, please do not call the office, but seek emergency evaluation by calling 911 or going to the nearest emergency room for evaluation.  ? ?MyChart and Phone Calls ?Please do not use MyChart for urgent messages. It may take up to 3 business days for MyChart messages to be read by staff and if they are unable to handle the request, an additional 3 business days for them to be routed to me and for my response.  ?Messages sent to the provider through [place] do not come directly to the provider, please allow time for these messages to be routed and for me to respond.  ?We get a large volume of MyChart messages daily and these are responded to in the order received.  ? ?For urgent messages, please call the office at [phone] and speak with the front office staff or leave a message on the line of my assistant for guidance.  ?We are seeing patients from the hours of 8:00 am through 5:00 pm and calls directly to the nurse may not be answered  immediately due to seeing patients, but your call will be returned as soon as possible.  ?Phone  messages received after 4:00 PM Monday through Thursday may not be returned until the following business day. Phone messages received after 11:00 AM on Friday may not be returned until Monday.  ? ?After Hours ?We share on call hours with providers from other offices. If you have an urgent need after hours that cannot wait until the next business day, please contact the on call provider by calling the office number. A nurse will speak with you and contact the provider if needed for recommendations.  ?If you have an urgent or  life threatening emergency after hours, please do not call the on call provider, but seek emergency evaluation by calling 911 or going to the nearest emergency room for evaluation.  ? ?Paperwork ?All paperwork requires a minimum of 5 days to complete and return to you or the designated personnel. Please keep this in mind when bringing in forms or sending requests for paperwork completion to the office.  ?  ?

## 2022-02-06 NOTE — Assessment & Plan Note (Signed)
Chronic. No alarm sx present at this time. Labs today. Will monitor.  ?

## 2022-02-07 LAB — CBC WITH DIFFERENTIAL/PLATELET
Basophils Absolute: 0.1 10*3/uL (ref 0.0–0.2)
Basos: 1 %
EOS (ABSOLUTE): 0.3 10*3/uL (ref 0.0–0.4)
Eos: 4 %
Hematocrit: 42.8 % (ref 34.0–46.6)
Hemoglobin: 14.2 g/dL (ref 11.1–15.9)
Immature Grans (Abs): 0 10*3/uL (ref 0.0–0.1)
Immature Granulocytes: 0 %
Lymphocytes Absolute: 2.7 10*3/uL (ref 0.7–3.1)
Lymphs: 39 %
MCH: 28.2 pg (ref 26.6–33.0)
MCHC: 33.2 g/dL (ref 31.5–35.7)
MCV: 85 fL (ref 79–97)
Monocytes Absolute: 0.4 10*3/uL (ref 0.1–0.9)
Monocytes: 6 %
Neutrophils Absolute: 3.5 10*3/uL (ref 1.4–7.0)
Neutrophils: 50 %
Platelets: 239 10*3/uL (ref 150–450)
RBC: 5.03 x10E6/uL (ref 3.77–5.28)
RDW: 13.9 % (ref 11.7–15.4)
WBC: 7 10*3/uL (ref 3.4–10.8)

## 2022-02-07 LAB — COMPREHENSIVE METABOLIC PANEL
ALT: 27 IU/L (ref 0–32)
AST: 21 IU/L (ref 0–40)
Albumin/Globulin Ratio: 1.7 (ref 1.2–2.2)
Albumin: 4.6 g/dL (ref 3.8–4.9)
Alkaline Phosphatase: 89 IU/L (ref 44–121)
BUN/Creatinine Ratio: 15 (ref 9–23)
BUN: 13 mg/dL (ref 6–24)
Bilirubin Total: 0.4 mg/dL (ref 0.0–1.2)
CO2: 22 mmol/L (ref 20–29)
Calcium: 10.1 mg/dL (ref 8.7–10.2)
Chloride: 102 mmol/L (ref 96–106)
Creatinine, Ser: 0.85 mg/dL (ref 0.57–1.00)
Globulin, Total: 2.7 g/dL (ref 1.5–4.5)
Glucose: 114 mg/dL — ABNORMAL HIGH (ref 70–99)
Potassium: 4.5 mmol/L (ref 3.5–5.2)
Sodium: 142 mmol/L (ref 134–144)
Total Protein: 7.3 g/dL (ref 6.0–8.5)
eGFR: 81 mL/min/{1.73_m2} (ref >59)

## 2022-02-07 LAB — LIPID PANEL
Chol/HDL Ratio: 2.6 ratio (ref 0.0–4.4)
Cholesterol, Total: 152 mg/dL (ref 100–199)
HDL: 59 mg/dL (ref >39)
LDL Chol Calc (NIH): 71 mg/dL (ref 0–99)
Triglycerides: 126 mg/dL (ref 0–149)
VLDL Cholesterol Cal: 22 mg/dL (ref 5–40)

## 2022-02-07 LAB — HEMOGLOBIN A1C
Est. average glucose Bld gHb Est-mCnc: 134 mg/dL
Hgb A1c MFr Bld: 6.3 % — ABNORMAL HIGH (ref 4.8–5.6)

## 2022-02-07 LAB — T4, FREE: Free T4: 1.43 ng/dL (ref 0.82–1.77)

## 2022-02-07 LAB — TSH: TSH: 2.5 u[IU]/mL (ref 0.450–4.500)

## 2022-02-21 IMAGING — US US SOFT TISSUE HEAD/NECK
1 series · 14 of 22 positions shown · non-contrast
Comparison: CT 02/05/2020

CLINICAL DATA: Neck mass at the left jawline

EXAM:
ULTRASOUND OF HEAD/NECK SOFT TISSUES
TECHNIQUE: Ultrasound examination of the head and neck soft tissues was
performed in the area of clinical concern.

[Series 1: us soft tissue head/neck · 0.08mm/px · 14 of 22 slices shown]
[im 1/22]
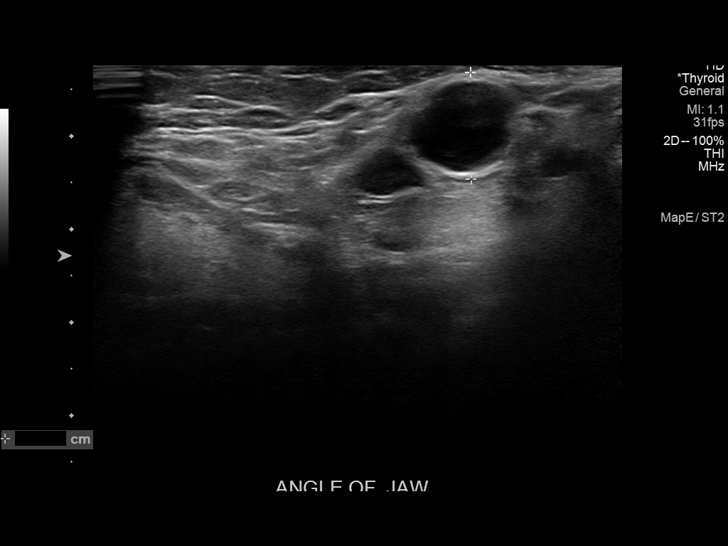
[im 3/22]
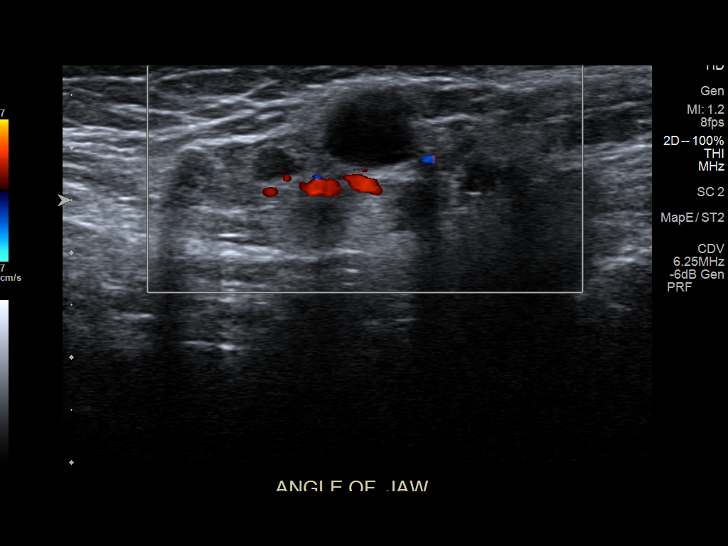
[im 4/22]
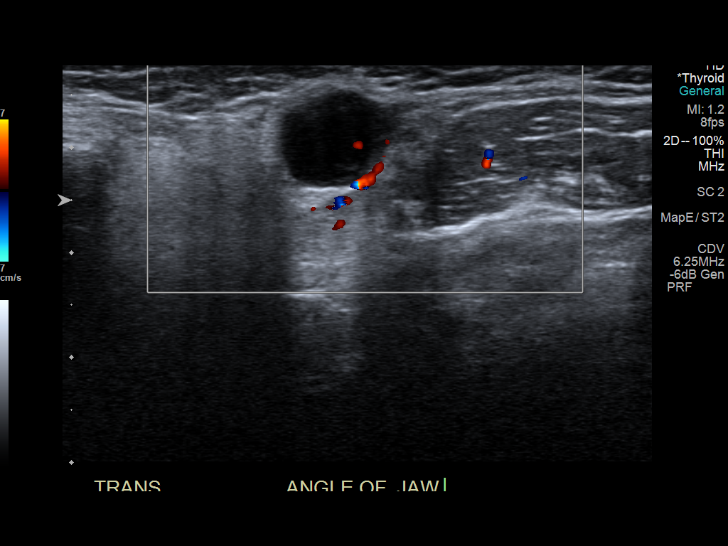
[im 6/22]
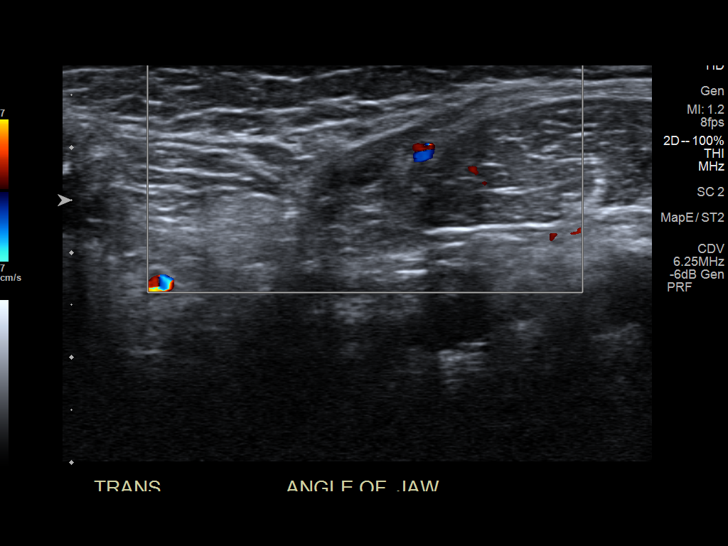
[im 8/22]
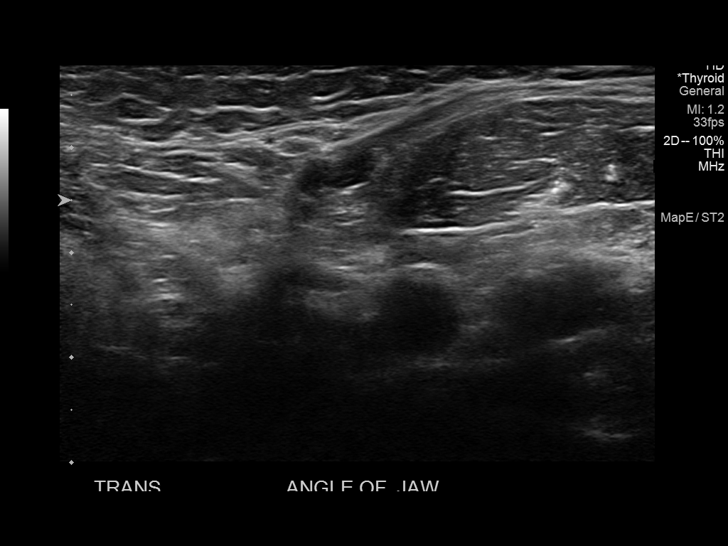
[im 9/22]
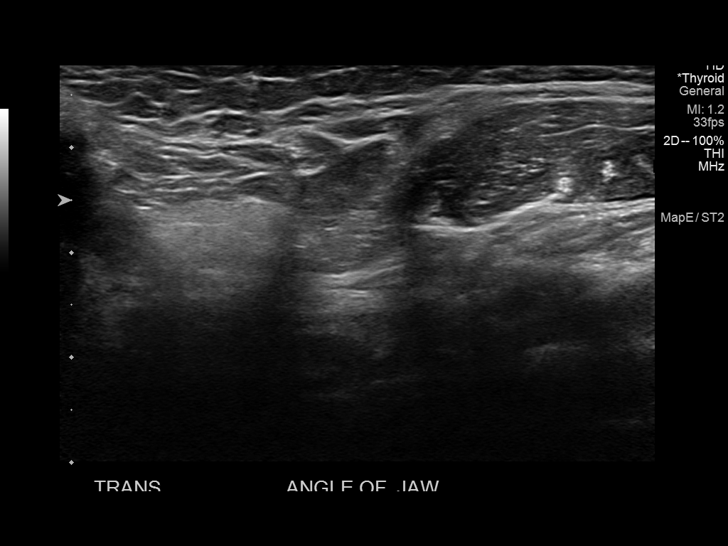
[im 11/22]
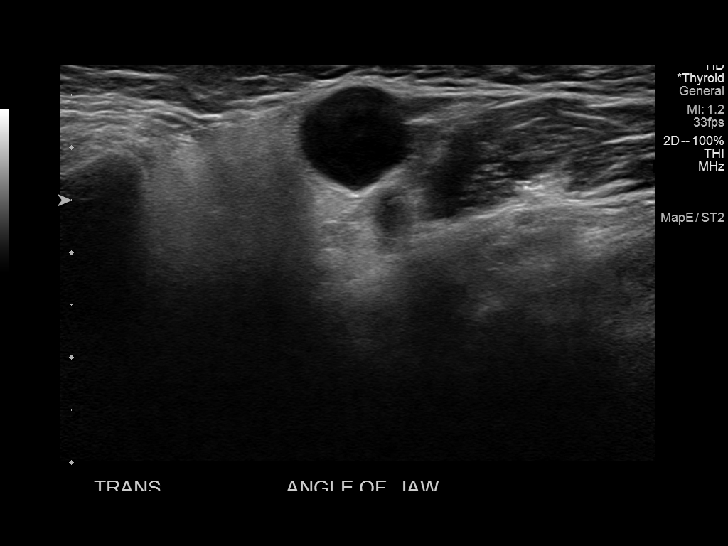
[im 12/22]
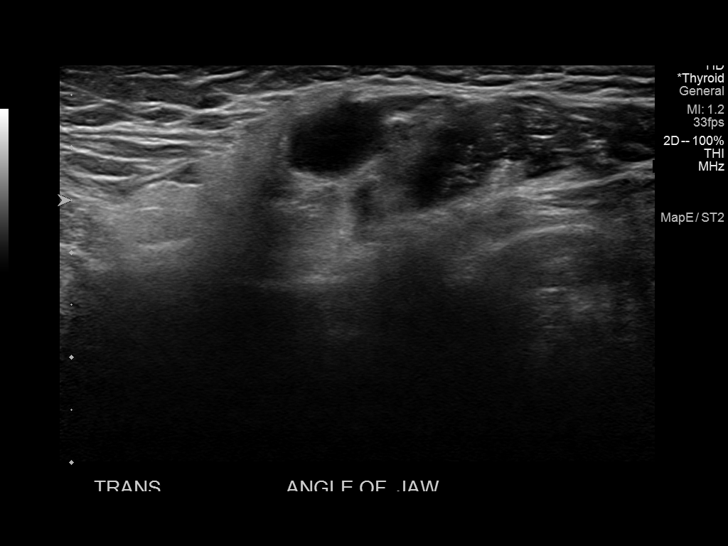
[im 14/22]
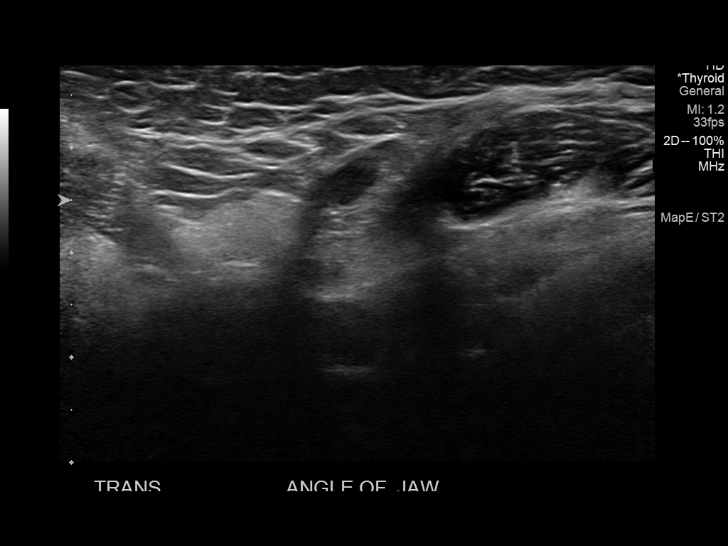
[im 15/22]
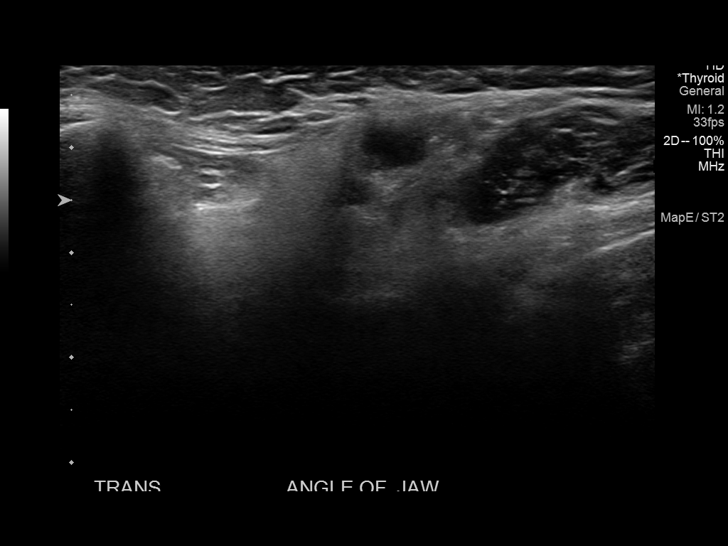
[im 17/22]
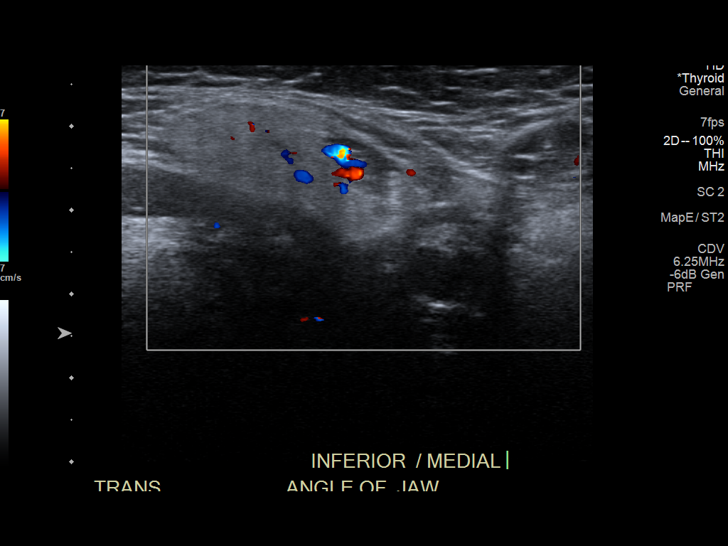
[im 19/22]
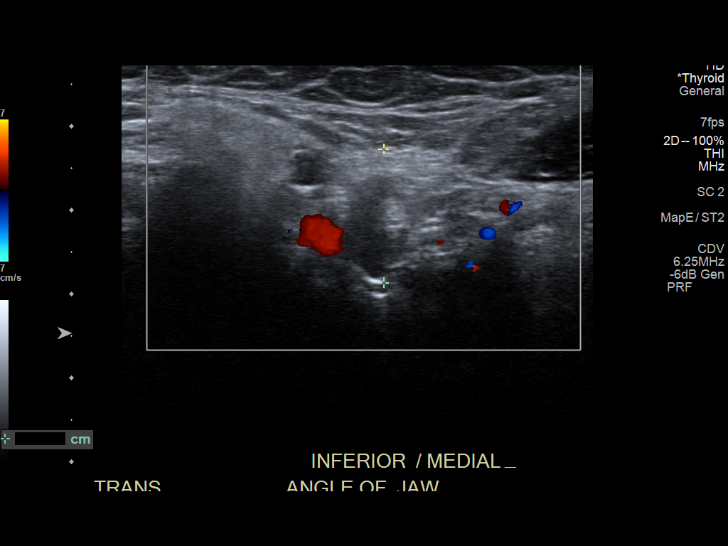
[im 20/22]
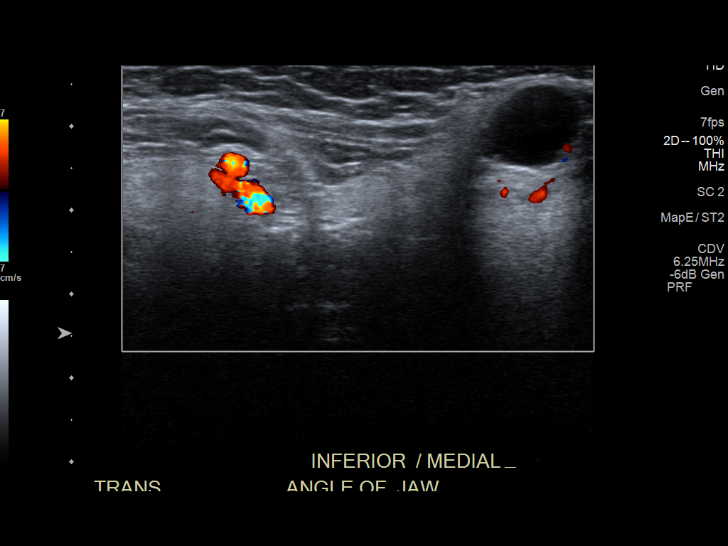
[im 22/22]
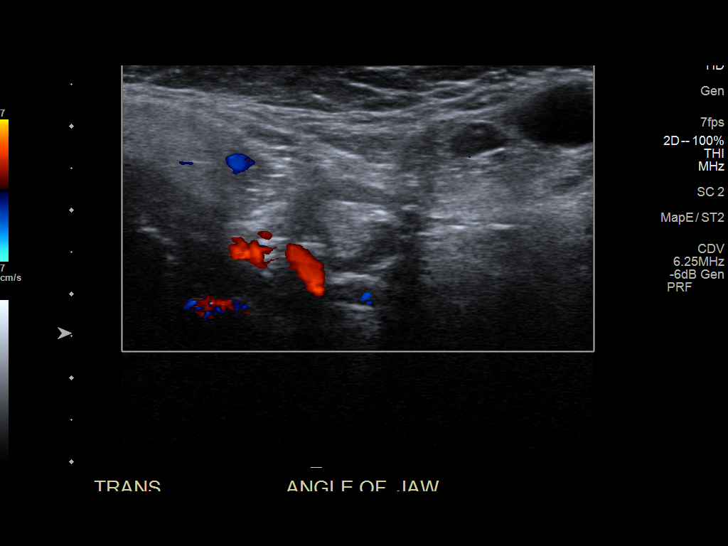

[14 of 22 positions shown; findings below may reference images not displayed]

FINDINGS: At the site of concern, there is a grouping of at least 3 lymph
nodes which appear pathologically enlarged. One appears possibly
necrotic. Short axis dimensions range from 1.1 cm to 1.6 cm. CT of
the neck is suggested for further characterization.
IMPRESSION: Grouping of abnormal lymph nodes in the left submandibular to level
2 region, presumed to represent pathologic nodal enlargement. CT of
the neck with contrast recommended.

## 2022-03-07 IMAGING — CT CT NECK W/ CM
4 series · 15 of 33 positions shown, 18 images · IV contrast (iopamidol)
Comparison: 02/05/2020 CT neck, 09/07/2021 ultrasound of the neck.

CLINICAL DATA: Enlarged lymph nodes, left neck

EXAM:
CT NECK WITH CONTRAST
TECHNIQUE: Multidetector CT imaging of the neck was performed using the
standard protocol following the bolus administration of intravenous
contrast.
CONTRAST:  75mL [id number] IOPAMIDOL ([id number]) INJECTION 61%

[Series 3: neck 2.0 st · axial · 0.43mm/px · z∈[-285,-247]mm · 2 of 115 slices shown (1 of 2)]
[im 20/115  bone]
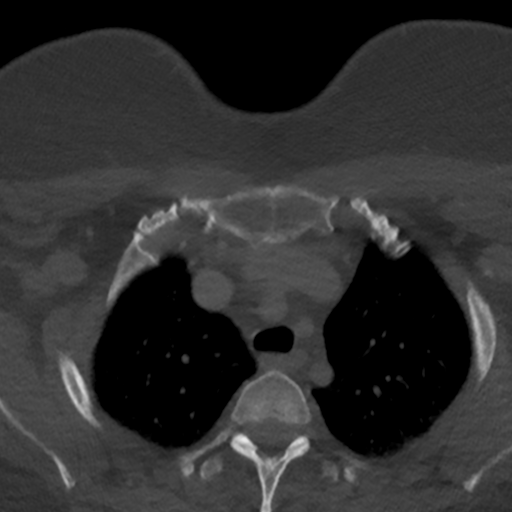
[im 39/115  bone]
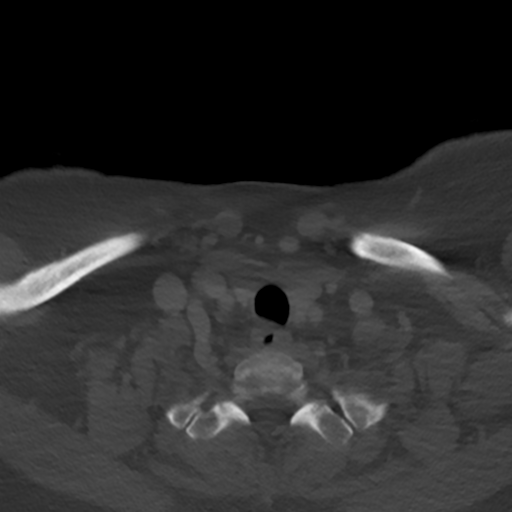

[Series 5: neck 2.0 st · sagittal · 0.46mm/px · 5 of 116 slices shown, 6 images (2 of 2)]
[im 39/116  bone]
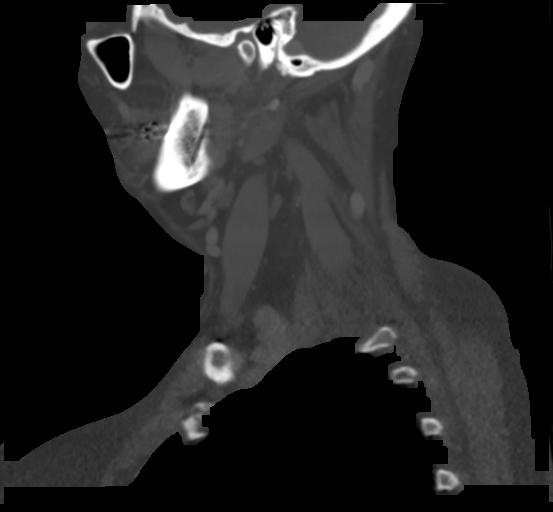
[im 48/116  bone]
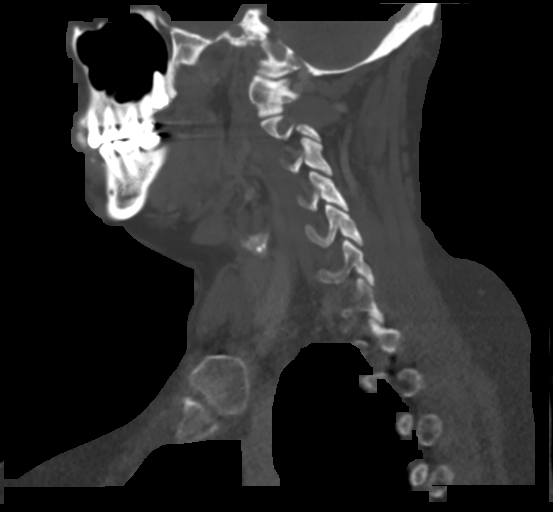
[im 58/116  soft-tissue]
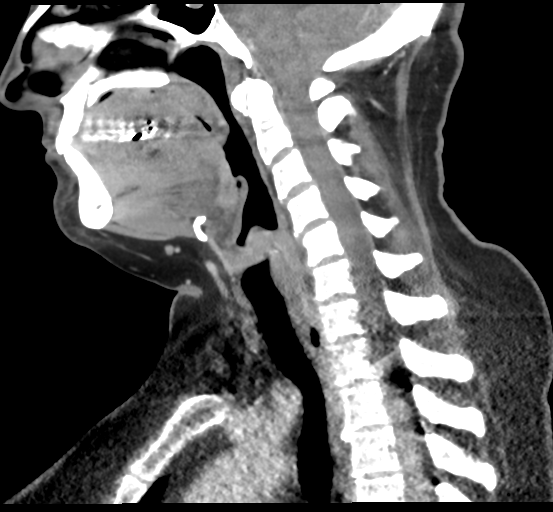
[im 58/116  bone]
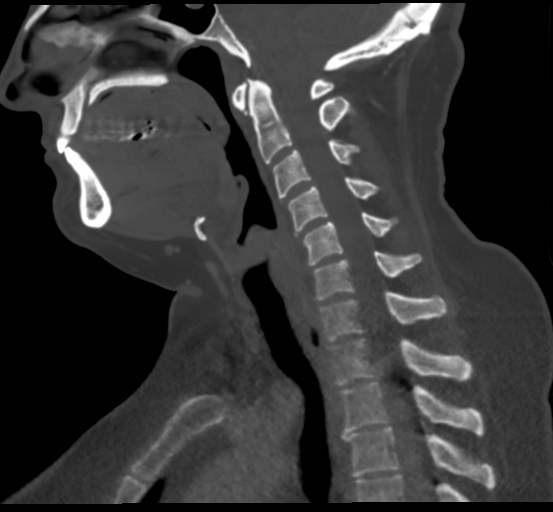
[im 68/116  bone]
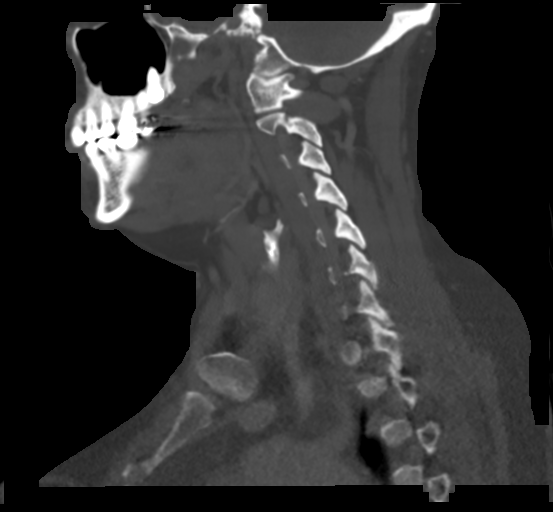
[im 77/116  bone]
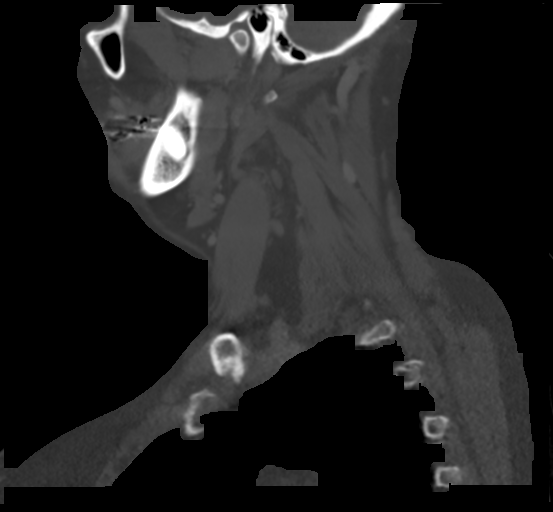

[Series 6: neck cor · coronal · 0.47mm/px · 3 of 102 slices shown]
[im 21/102  bone]
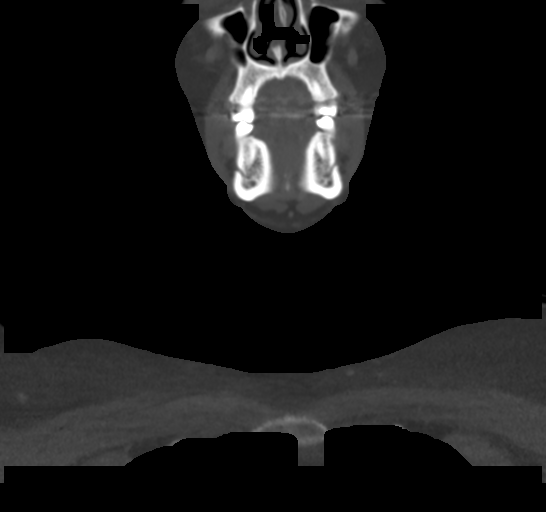
[im 41/102  bone]
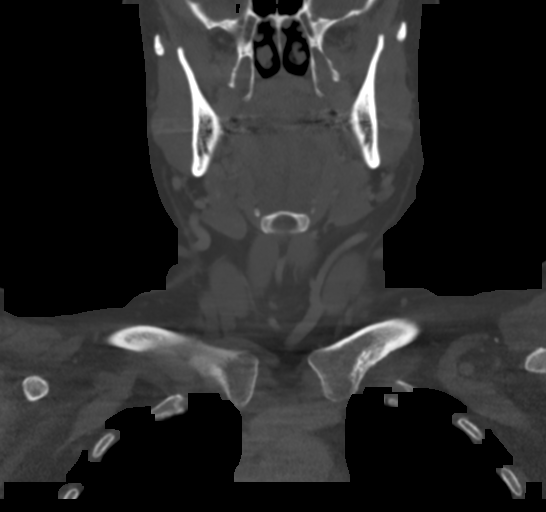
[im 61/102  bone]
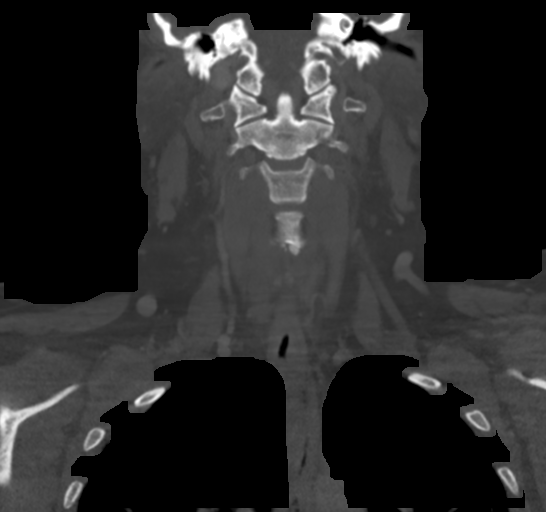

[Series 7: neck 2.0 st orthogonal · axial · 0.39mm/px · z∈[-304,-152]mm · 5 of 117 slices shown, 7 images]
[im 20/117  soft-tissue]
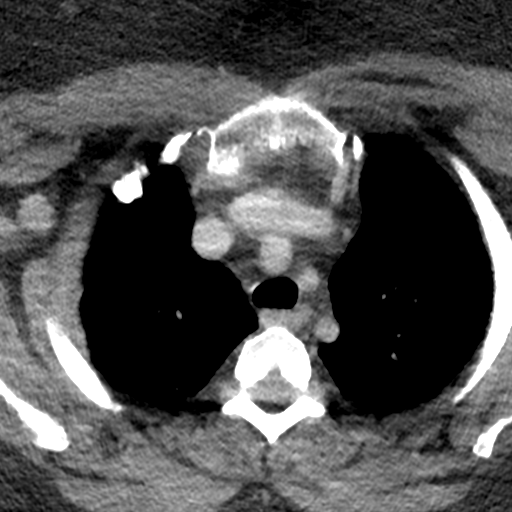
[im 20/117  bone]
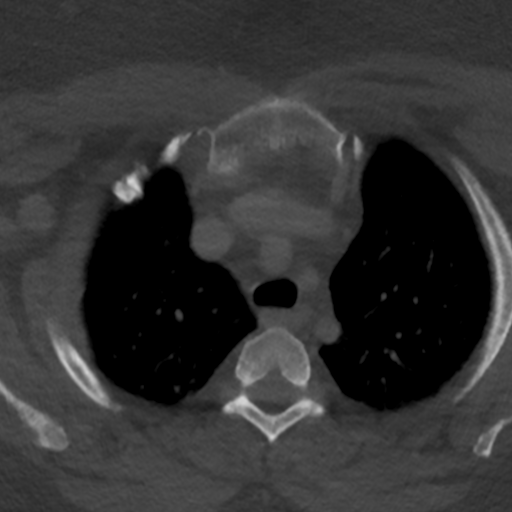
[im 39/117  bone]
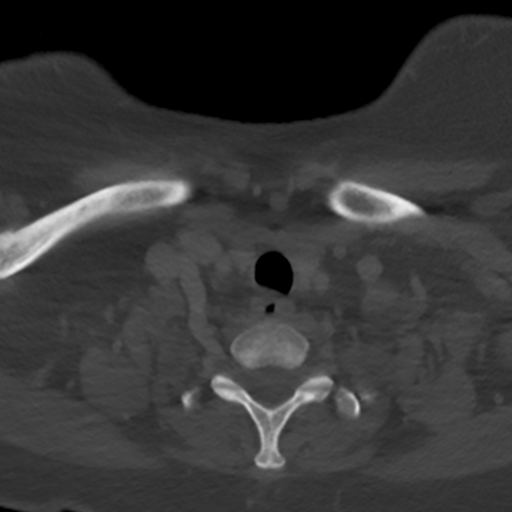
[im 59/117  bone]
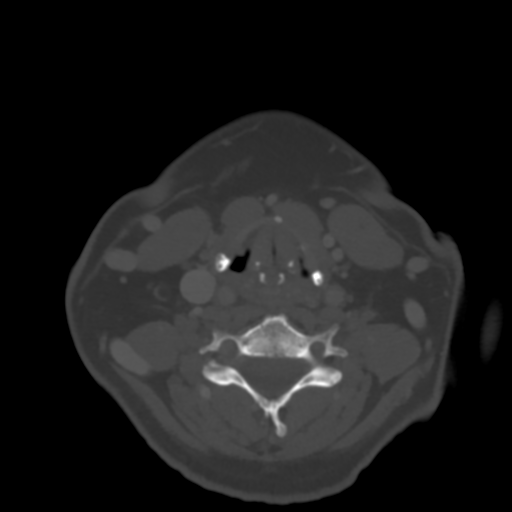
[im 78/117  bone]
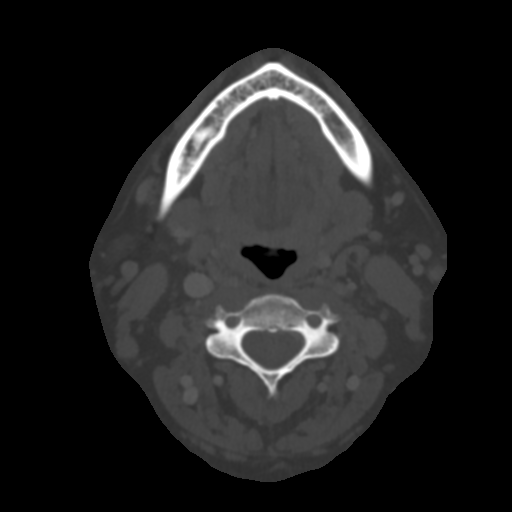
[im 97/117  soft-tissue]
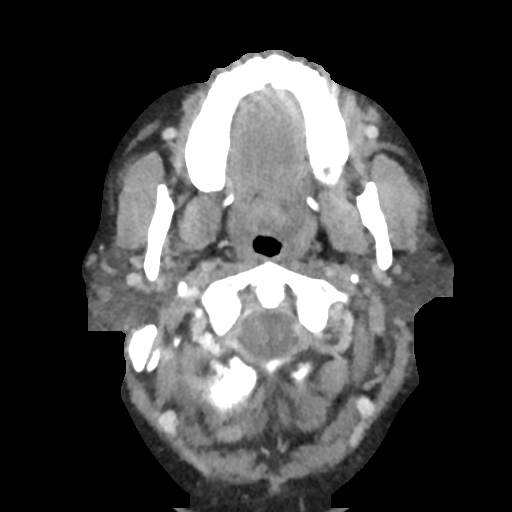
[im 97/117  bone]
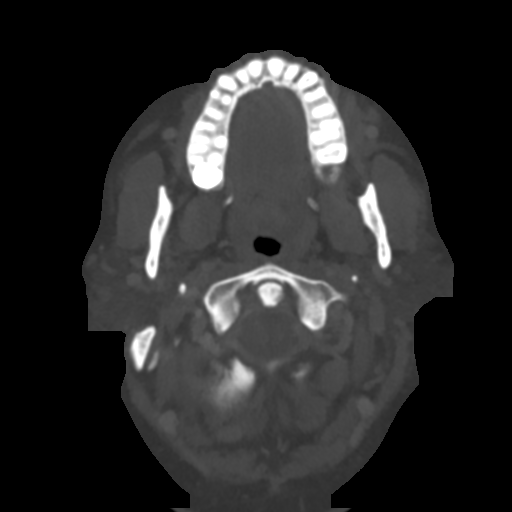

[15 of 33 positions shown; findings below may reference images not displayed]

FINDINGS: Pharynx and larynx: Normal. No mass or swelling.

Salivary glands: No inflammation, mass, or stone.

Thyroid: Normal.

Lymph nodes: In the left parotid tail, slightly superior to the
marker, there is an enlarged, rounded lymph node that measures 1.2 x
1.1 x 1.2 cm (AP x TR x CC) (series 3, image 37 and series 6, image
55) this lymph node likely measured up to 0.3 cm on the prior exam.
This likely correlates with the largest lymph node seen on the
09/07/2021 ultrasound, with additional slightly more inferior (level
2A) lymph nodes measuring up to 0.4 and 0.3 cm in short axis (series
3, images 40 and 43, respectively) correlating with the other lymph
nodes seen on that exam, slightly increased in size from the prior
exam.

Mildly rounded right level 3 lymph node, which measures 0.6 x 1.0 x
1.1 cm (series 3, image 51 and series 6, image 50), previously 0.6 x
0.5 x 1.1 cm.

No other lymphadenopathy in the neck. The previously described lymph
node in the posterior left neck is unchanged, measuring 0.4 cm in
short axis (series 7, image 36).

Vascular: Patent.

Limited intracranial: Negative.

Visualized orbits: Negative.

Mastoids and visualized paranasal sinuses: Clear.

Skeleton: No acute osseous abnormality.

Upper chest: Negative.

Other: None.
IMPRESSION: 1. Enlarged, rounded lymph node in the tail of the left parotid,
which likely correlates with the reported palpable abnormality and
the enlarged lymph node seen on the 09/07/2021 ultrasound. This
lymph node is increased in size compared to 02/05/2020. Given
somewhat abnormal morphology and increased size, sampling is
recommended.
2. Additional prominent right level 3 and left level 2A nodes, which
have increased in prominence compared to the prior exam.
3. No other acute abnormality in the neck.

## 2022-03-15 ENCOUNTER — Other Ambulatory Visit (HOSPITAL_BASED_OUTPATIENT_CLINIC_OR_DEPARTMENT_OTHER): Payer: Self-pay

## 2022-03-21 ENCOUNTER — Other Ambulatory Visit (HOSPITAL_BASED_OUTPATIENT_CLINIC_OR_DEPARTMENT_OTHER): Payer: Self-pay

## 2022-03-21 DIAGNOSIS — E038 Other specified hypothyroidism: Secondary | ICD-10-CM

## 2022-03-21 MED ORDER — SYNTHROID 88 MCG PO TABS: 88.0000 ug | ORAL_TABLET | Freq: Every day | ORAL | 0 refills | Status: DC

## 2022-04-04 IMAGING — US US BIOPSY FNA W/ IMAGING
1 series · 14 of 14 positions shown · non-contrast
Comparison: none

INDICATION: 55-year-old with a palpable nodule along the inferior aspect of the
left parotid gland. Tissue diagnosis is needed.

[Series 1: us biopsy (kidney) mc & wl · 14 of 14 slices shown]
[im 1/14]
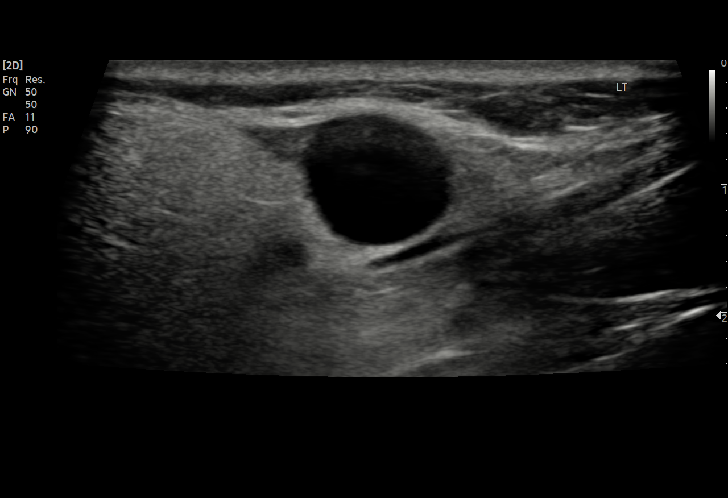
[im 2/14]
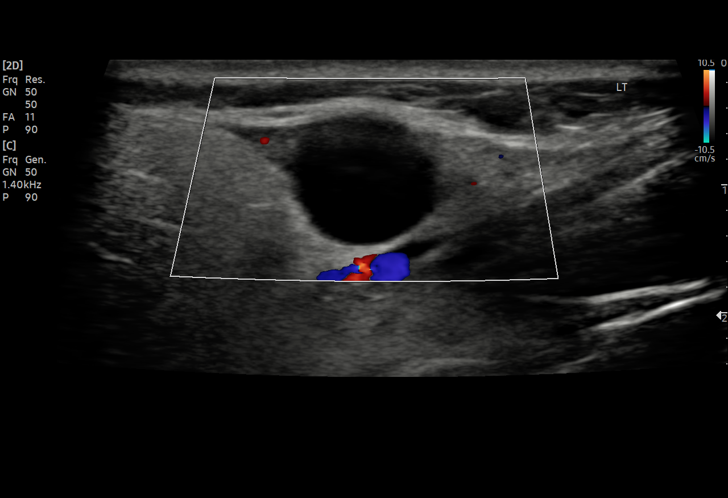
[im 3/14]
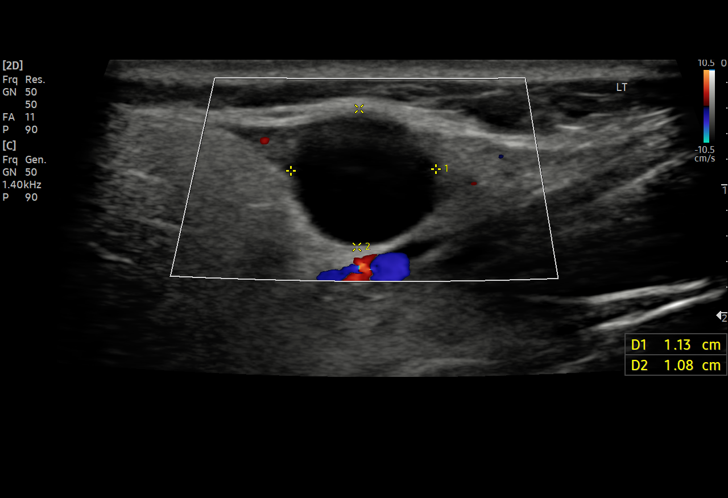
[im 4/14]
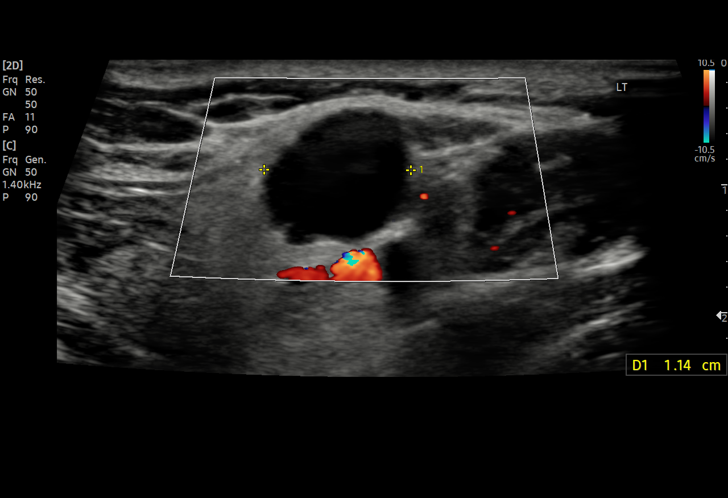
[im 5/14]
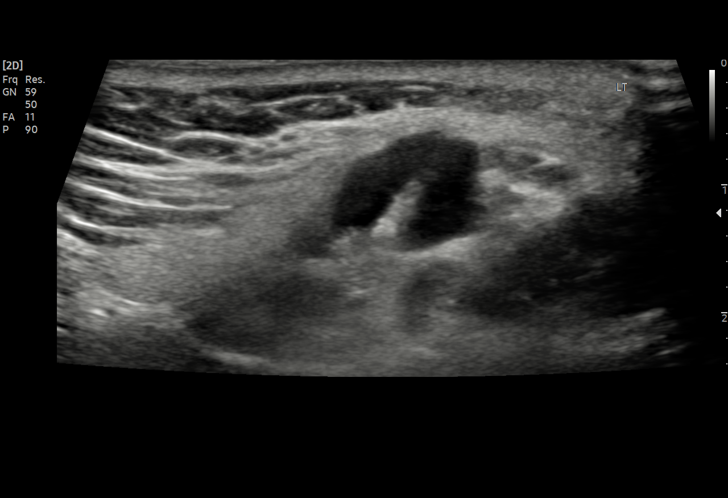
[im 6/14]
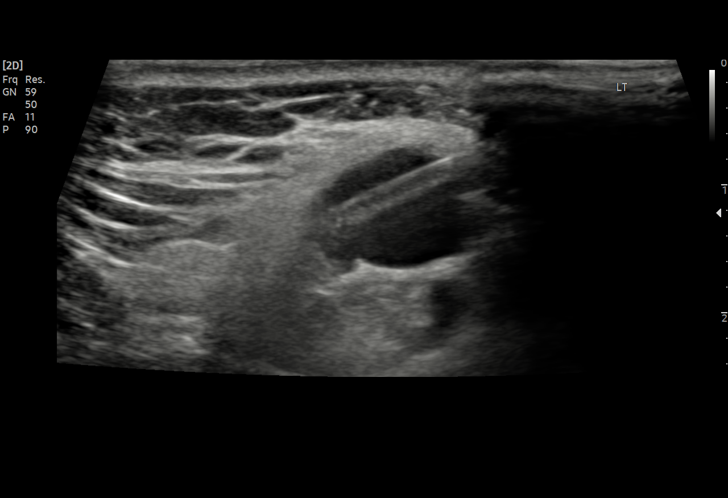
[im 7/14]
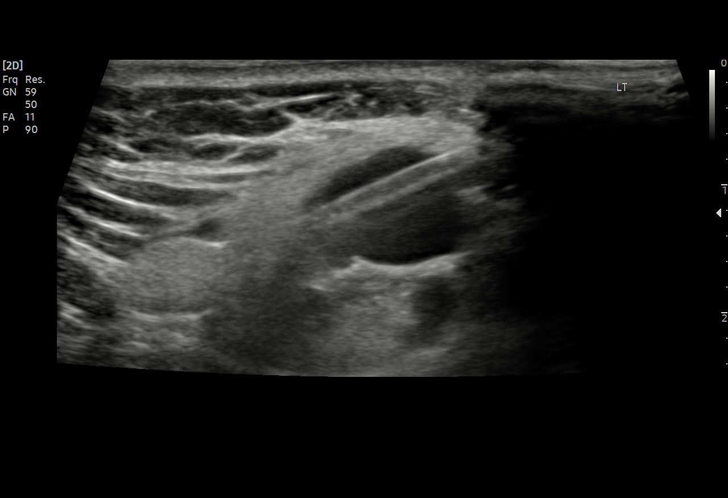
[im 8/14]
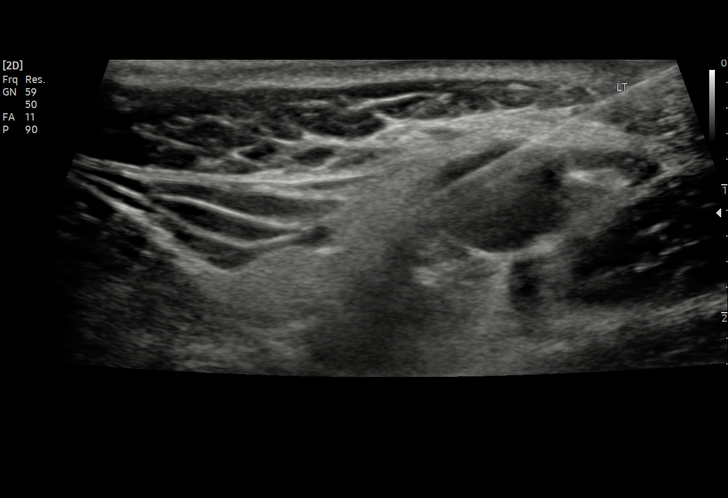
[im 9/14]
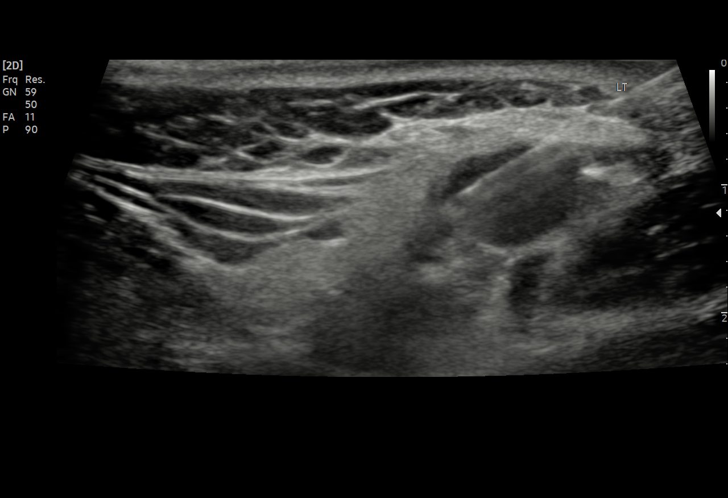
[im 10/14]
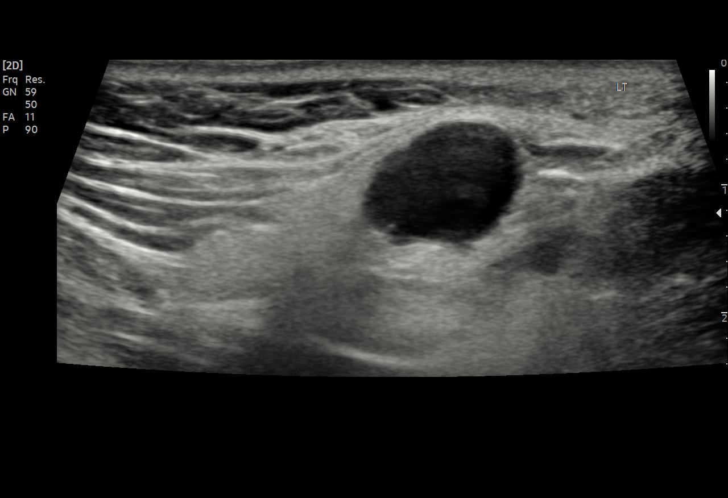
[im 11/14]
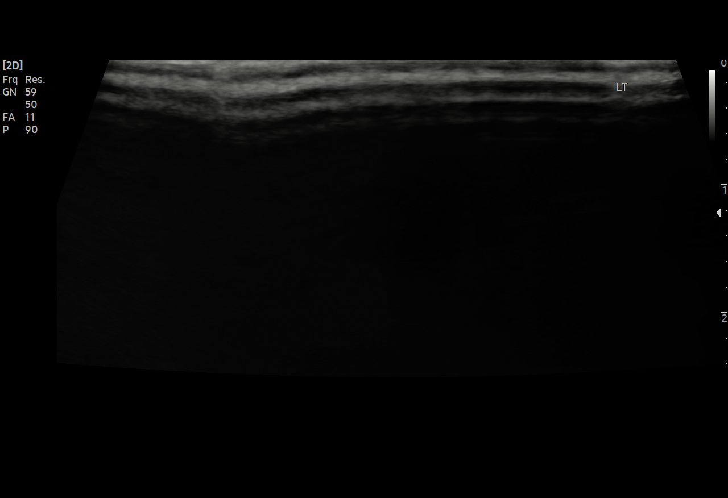
[im 12/14]
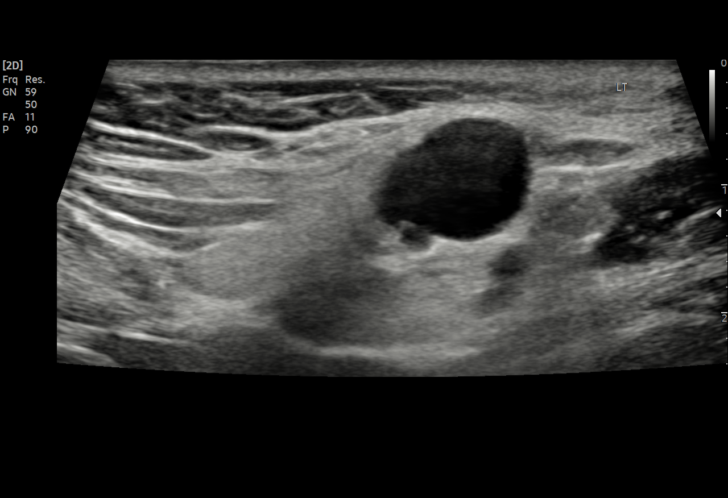
[im 13/14]
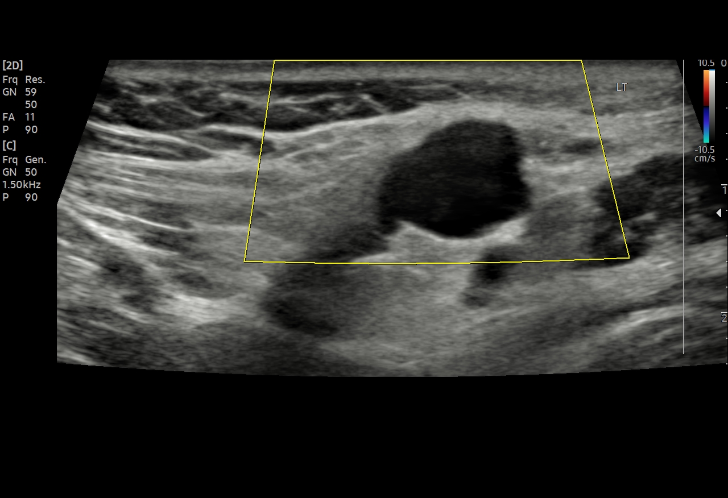
[im 14/14]
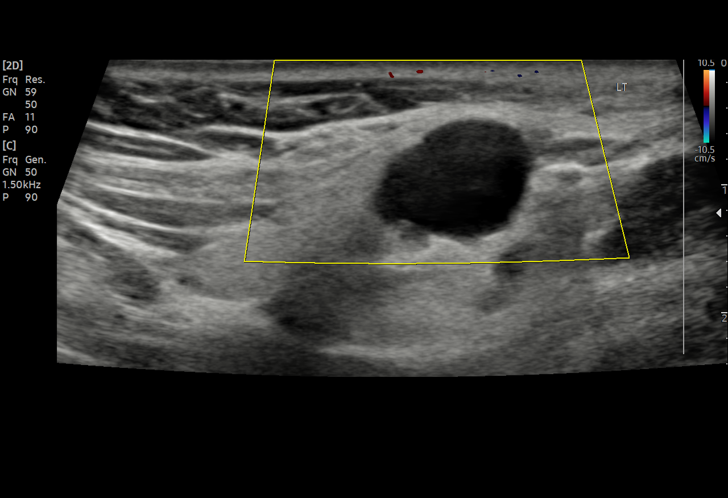

[14 of 14 positions shown; findings below may reference images not displayed]

EXAM:
ULTRASOUND-GUIDED BIOPSY OF LEFT PAROTID NODULE/LYMPH NODE

MEDICATIONS:
Moderate sedation

ANESTHESIA/SEDATION:
Moderate (conscious) sedation was employed during this procedure. A
total of Versed 2.0mg and fentanyl 100 mcg was administered
intravenously at the order of the provider performing the procedure.

Total intra-service moderate sedation time: 12 minutes.

Patient's level of consciousness and vital signs were monitored
continuously by radiology nurse throughout the procedure under the
supervision of the provider performing the procedure.

FLUOROSCOPY TIME:  None

COMPLICATIONS:
None immediate.

PROCEDURE:
Informed written consent was obtained from the patient after a
thorough discussion of the procedural risks, benefits and
alternatives. All questions were addressed. A timeout was performed
prior to the initiation of the procedure.

Area of concern in the left upper neck was identified. The skin was
prepped with chlorhexidine and sterile field was created. Skin was
anesthetized using 1% lidocaine. Small incision was made. Using
ultrasound guidance, an 18 gauge core device was directed into the
hypoechoic lesion. Core biopsy obtained and placed in saline. Total
of 3 core biopsies were obtained. Bandage placed over the puncture
site.
FINDINGS: Well-defined very hypoechoic nodule along the inferior aspect of the
left parotid gland that measures 1.1 x 1.1 x 1.1 cm. Core biopsy
needle was confirmed within the lesion. Three adequate specimens
were obtained. No immediate bleeding or hematoma formation.
IMPRESSION: Ultrasound-guided core biopsy of the hypoechoic nodule involving or
adjacent to the left parotid gland.

## 2022-04-11 ENCOUNTER — Other Ambulatory Visit (HOSPITAL_BASED_OUTPATIENT_CLINIC_OR_DEPARTMENT_OTHER): Payer: Self-pay

## 2022-05-03 ENCOUNTER — Encounter (INDEPENDENT_AMBULATORY_CARE_PROVIDER_SITE_OTHER): Payer: Self-pay

## 2022-05-06 ENCOUNTER — Other Ambulatory Visit (HOSPITAL_BASED_OUTPATIENT_CLINIC_OR_DEPARTMENT_OTHER): Payer: Self-pay | Admitting: Nurse Practitioner

## 2022-05-06 DIAGNOSIS — E038 Other specified hypothyroidism: Secondary | ICD-10-CM

## 2022-05-08 ENCOUNTER — Other Ambulatory Visit (HOSPITAL_BASED_OUTPATIENT_CLINIC_OR_DEPARTMENT_OTHER): Payer: Self-pay | Admitting: Nurse Practitioner

## 2022-05-08 DIAGNOSIS — E038 Other specified hypothyroidism: Secondary | ICD-10-CM

## 2022-05-08 MED ORDER — SYNTHROID 88 MCG PO TABS: 88.0000 ug | ORAL_TABLET | Freq: Every day | ORAL | 0 refills | Status: DC

## 2022-06-07 ENCOUNTER — Other Ambulatory Visit (HOSPITAL_BASED_OUTPATIENT_CLINIC_OR_DEPARTMENT_OTHER): Payer: Self-pay | Admitting: Nurse Practitioner

## 2022-06-07 DIAGNOSIS — E038 Other specified hypothyroidism: Secondary | ICD-10-CM

## 2022-06-07 MED ORDER — SYNTHROID 88 MCG PO TABS: 88.0000 ug | ORAL_TABLET | Freq: Every day | ORAL | 0 refills | Status: DC

## 2022-08-09 ENCOUNTER — Ambulatory Visit (HOSPITAL_BASED_OUTPATIENT_CLINIC_OR_DEPARTMENT_OTHER): Payer: 59 | Admitting: Nurse Practitioner

## 2022-08-09 ENCOUNTER — Encounter (HOSPITAL_BASED_OUTPATIENT_CLINIC_OR_DEPARTMENT_OTHER): Payer: Self-pay | Admitting: Nurse Practitioner

## 2022-08-09 VITALS — BP 137/85 | HR 85 | Temp 98.6°F | Ht 66.5 in | Wt 247.0 lb

## 2022-08-09 DIAGNOSIS — E782 Mixed hyperlipidemia: Secondary | ICD-10-CM | POA: Diagnosis not present

## 2022-08-09 DIAGNOSIS — E039 Hypothyroidism, unspecified: Secondary | ICD-10-CM | POA: Diagnosis not present

## 2022-08-09 DIAGNOSIS — D6851 Activated protein C resistance: Secondary | ICD-10-CM

## 2022-08-09 DIAGNOSIS — Z6838 Body mass index (BMI) 38.0-38.9, adult: Secondary | ICD-10-CM | POA: Diagnosis not present

## 2022-08-09 DIAGNOSIS — E559 Vitamin D deficiency, unspecified: Secondary | ICD-10-CM

## 2022-08-09 DIAGNOSIS — E038 Other specified hypothyroidism: Secondary | ICD-10-CM

## 2022-08-09 DIAGNOSIS — Z9189 Other specified personal risk factors, not elsewhere classified: Secondary | ICD-10-CM

## 2022-08-09 DIAGNOSIS — E538 Deficiency of other specified B group vitamins: Secondary | ICD-10-CM

## 2022-08-09 DIAGNOSIS — R7303 Prediabetes: Secondary | ICD-10-CM | POA: Diagnosis not present

## 2022-08-09 DIAGNOSIS — E7849 Other hyperlipidemia: Secondary | ICD-10-CM

## 2022-08-09 DIAGNOSIS — N951 Menopausal and female climacteric states: Secondary | ICD-10-CM

## 2022-08-09 MED ORDER — SYNTHROID 88 MCG PO TABS: 88.0000 ug | ORAL_TABLET | Freq: Every day | ORAL | 3 refills | Status: DC

## 2022-08-09 MED ORDER — ROSUVASTATIN CALCIUM 10 MG PO TABS: 10.0000 mg | ORAL_TABLET | Freq: Every day | ORAL | 3 refills | Status: DC

## 2022-08-09 MED ORDER — FLUOXETINE HCL 10 MG PO TABS: 10.0000 mg | ORAL_TABLET | Freq: Every day | ORAL | 3 refills | Status: DC

## 2022-08-09 NOTE — Patient Instructions (Signed)
Work on diet and exercise and we will see how your blood sugars and cholesterol are looking.   I have sent in refills, but will let you know how the labs are looking and if we need to change anything.   If the prozac does not seem to help with the hotflashes after about 6 weeks, please let me know and we can look at other options.

## 2022-08-09 NOTE — Progress Notes (Signed)
Maria Keeler, DNP, AGNP-c Maria Glenn, [place] [address] [phone] Office [phone] Fax  ESTABLISHED PATIENT- Chronic Health and/or Follow-Up Visit  Blood pressure 137/85, pulse 85, temperature 98.6 F (37 C), height 5' 6.5" (1.689 m), weight 247 lb (112 kg), SpO2 95 %.   Maria Glenn is a 56 y.o. year old female presenting today for evaluation and management of the following: Follow-up (Ozempic made her nauseas. Also 90 day refills on medications)  Vasomotor menopause symptoms Was taking black co-hosh- worked well the first time, but stopped because of her concern with blood clotting d/o. Restarted when GYN said OK. Not having effect this time. GYN mentioned prozac. Up all night on/off with hot flashes  Pre-DM No CP, ShOB, LE edema, increased thirst, increased urination, increased hunger  She was on ozempic, but this was making her too sick. Currently managing with diet and exercise monitoring.   Thyroid Taking levothyroxine 22mg. She is not having any new or concerning symptoms.   All ROS negative with exception of what is listed above.   PHYSICAL EXAM Physical Exam Vitals and nursing note reviewed.  Constitutional:      General: She is not in acute distress.    Appearance: Normal appearance.  HENT:     Head: Normocephalic.  Eyes:     Extraocular Movements: Extraocular movements intact.     Conjunctiva/sclera: Conjunctivae normal.     Pupils: Pupils are equal, round, and reactive to light.  Neck:     Vascular: No carotid bruit.  Cardiovascular:     Rate and Rhythm: Normal rate and regular rhythm.     Pulses: Normal pulses.     Heart sounds: Normal heart sounds. No murmur heard. Pulmonary:     Effort: Pulmonary effort is normal.     Breath sounds: Normal breath sounds. No wheezing.  Abdominal:     General: Bowel sounds are normal. There is no  distension.     Palpations: Abdomen is soft.     Tenderness: There is no abdominal tenderness. There is no guarding.  Musculoskeletal:        General: Normal range of motion.     Cervical back: Normal range of motion and neck supple.     Right lower leg: No edema.     Left lower leg: No edema.  Lymphadenopathy:     Cervical: No cervical adenopathy.  Skin:    General: Skin is warm and dry.     Capillary Refill: Capillary refill takes less than 2 seconds.  Neurological:     General: No focal deficit present.     Mental Status: She is alert and oriented to person, place, and time.  Psychiatric:        Mood and Affect: Mood normal.        Behavior: Behavior normal.        Thought Content: Thought content normal.        Judgment: Judgment normal.     PLAN Problem List Items Addressed This Visit     Heterozygous factor V Leiden mutation ([place] (Chronic)    Labs pending today. No symptoms. Continue to monitor.       Relevant Orders   CBC with Differential/Platelet (Completed)   Comprehensive metabolic panel (Completed)   Hemoglobin A1c (Completed)   Lipid panel (Completed)   TSH (Completed)   T4, free (Completed)   VITAMIN D 25 Hydroxy (Vit-D Deficiency, Fractures) (  Completed)   B12 and Folate Panel (Completed)   Acquired hypothyroidism    Chronic. No new or concerning symptoms present at this time. Labs pending today. Refill for synthroid sent to pharmacy. Will make changes to the plan of care as necessary.       Relevant Medications   SYNTHROID 88 MCG tablet   Other Relevant Orders   CBC with Differential/Platelet (Completed)   Comprehensive metabolic panel (Completed)   Hemoglobin A1c (Completed)   Lipid panel (Completed)   TSH (Completed)   T4, free (Completed)   VITAMIN D 25 Hydroxy (Vit-D Deficiency, Fractures) (Completed)   B12 and Folate Panel (Completed)   Vitamin D deficiency    Labs pending today.       Relevant Orders   CBC with Differential/Platelet  (Completed)   Comprehensive metabolic panel (Completed)   Hemoglobin A1c (Completed)   Lipid panel (Completed)   TSH (Completed)   T4, free (Completed)   VITAMIN D 25 Hydroxy (Vit-D Deficiency, Fractures) (Completed)   B12 and Folate Panel (Completed)   Other hyperlipidemia    Chronic. Refills on statin therapy provided. Labs pending. No current symptoms.       Relevant Medications   rosuvastatin (CRESTOR) 10 MG tablet   B12 deficiency    Chronic. Repeat labs for monitoring today.       Relevant Orders   CBC with Differential/Platelet (Completed)   Comprehensive metabolic panel (Completed)   Hemoglobin A1c (Completed)   Lipid panel (Completed)   TSH (Completed)   T4, free (Completed)   VITAMIN D 25 Hydroxy (Vit-D Deficiency, Fractures) (Completed)   B12 and Folate Panel (Completed)   Vasomotor symptoms due to menopause    Not currently well managed with black co-hosh treatment. We will plan to start prozac today to see if this helps with her symptoms. Recommend giving the medication at least 6 weeks for maximum effectiveness. We can increase dose as needed if partial control is received. If no improvement after 6 weeks, recommend f/u for further evaluation and management.       Relevant Medications   FLUoxetine (PROZAC) 10 MG tablet   Other Visit Diagnoses     Prediabetes    -  Primary   Relevant Medications   rosuvastatin (CRESTOR) 10 MG tablet   Other Relevant Orders   CBC with Differential/Platelet (Completed)   Comprehensive metabolic panel (Completed)   Hemoglobin A1c (Completed)   Lipid panel (Completed)   TSH (Completed)   T4, free (Completed)   VITAMIN D 25 Hydroxy (Vit-D Deficiency, Fractures) (Completed)   B12 and Folate Panel (Completed)   BMI 38.0-38.9,adult       Relevant Medications   rosuvastatin (CRESTOR) 10 MG tablet   Other Relevant Orders   CBC with Differential/Platelet (Completed)   Comprehensive metabolic panel (Completed)   Hemoglobin A1c  (Completed)   Lipid panel (Completed)   TSH (Completed)   T4, free (Completed)   VITAMIN D 25 Hydroxy (Vit-D Deficiency, Fractures) (Completed)   B12 and Folate Panel (Completed)   Moderate mixed hyperlipidemia not requiring statin therapy       Relevant Medications   rosuvastatin (CRESTOR) 10 MG tablet   Other Relevant Orders   CBC with Differential/Platelet (Completed)   Comprehensive metabolic panel (Completed)   Hemoglobin A1c (Completed)   Lipid panel (Completed)   TSH (Completed)   T4, free (Completed)   VITAMIN D 25 Hydroxy (Vit-D Deficiency, Fractures) (Completed)   B12 and Folate Panel (Completed)   Other specified hypothyroidism  Relevant Medications   SYNTHROID 88 MCG tablet   At risk for impaired metabolic function       Relevant Medications   rosuvastatin (CRESTOR) 10 MG tablet       Return in about 6 months (around 02/07/2023) for Thyroid, PreDM, HLD.   Maria Keeler, DNP, AGNP-c 08/09/2022  8:57 AM

## 2022-08-10 LAB — COMPREHENSIVE METABOLIC PANEL
ALT: 34 IU/L — ABNORMAL HIGH (ref 0–32)
AST: 28 IU/L (ref 0–40)
Albumin/Globulin Ratio: 1.6 (ref 1.2–2.2)
Albumin: 4.6 g/dL (ref 3.8–4.9)
Alkaline Phosphatase: 93 IU/L (ref 44–121)
BUN/Creatinine Ratio: 19 (ref 9–23)
BUN: 14 mg/dL (ref 6–24)
Bilirubin Total: 0.4 mg/dL (ref 0.0–1.2)
CO2: 22 mmol/L (ref 20–29)
Calcium: 10.1 mg/dL (ref 8.7–10.2)
Chloride: 102 mmol/L (ref 96–106)
Creatinine, Ser: 0.73 mg/dL (ref 0.57–1.00)
Globulin, Total: 2.8 g/dL (ref 1.5–4.5)
Glucose: 136 mg/dL — ABNORMAL HIGH (ref 70–99)
Potassium: 4.9 mmol/L (ref 3.5–5.2)
Sodium: 140 mmol/L (ref 134–144)
Total Protein: 7.4 g/dL (ref 6.0–8.5)
eGFR: 98 mL/min/{1.73_m2} (ref >59)

## 2022-08-10 LAB — CBC WITH DIFFERENTIAL/PLATELET
Basophils Absolute: 0.1 10*3/uL (ref 0.0–0.2)
Basos: 1 %
EOS (ABSOLUTE): 0.3 10*3/uL (ref 0.0–0.4)
Eos: 4 %
Hematocrit: 41.2 % (ref 34.0–46.6)
Hemoglobin: 13.6 g/dL (ref 11.1–15.9)
Immature Grans (Abs): 0 10*3/uL (ref 0.0–0.1)
Immature Granulocytes: 0 %
Lymphocytes Absolute: 2.6 10*3/uL (ref 0.7–3.1)
Lymphs: 40 %
MCH: 28.2 pg (ref 26.6–33.0)
MCHC: 33 g/dL (ref 31.5–35.7)
MCV: 86 fL (ref 79–97)
Monocytes Absolute: 0.4 10*3/uL (ref 0.1–0.9)
Monocytes: 6 %
Neutrophils Absolute: 3.2 10*3/uL (ref 1.4–7.0)
Neutrophils: 49 %
Platelets: 231 10*3/uL (ref 150–450)
RBC: 4.82 x10E6/uL (ref 3.77–5.28)
RDW: 14.4 % (ref 11.7–15.4)
WBC: 6.6 10*3/uL (ref 3.4–10.8)

## 2022-08-10 LAB — HEMOGLOBIN A1C
Est. average glucose Bld gHb Est-mCnc: 157 mg/dL
Hgb A1c MFr Bld: 7.1 % — ABNORMAL HIGH (ref 4.8–5.6)

## 2022-08-10 LAB — LIPID PANEL
Chol/HDL Ratio: 2.5 ratio (ref 0.0–4.4)
Cholesterol, Total: 176 mg/dL (ref 100–199)
HDL: 70 mg/dL (ref >39)
LDL Chol Calc (NIH): 83 mg/dL (ref 0–99)
Triglycerides: 135 mg/dL (ref 0–149)
VLDL Cholesterol Cal: 23 mg/dL (ref 5–40)

## 2022-08-10 LAB — TSH: TSH: 2.42 u[IU]/mL (ref 0.450–4.500)

## 2022-08-10 LAB — B12 AND FOLATE PANEL
Folate: 7.3 ng/mL (ref >3.0)
Vitamin B-12: 384 pg/mL (ref 232–1245)

## 2022-08-10 LAB — T4, FREE: Free T4: 1.23 ng/dL (ref 0.82–1.77)

## 2022-08-10 LAB — VITAMIN D 25 HYDROXY (VIT D DEFICIENCY, FRACTURES): Vit D, 25-Hydroxy: 30.2 ng/mL (ref 30.0–100.0)

## 2022-08-17 ENCOUNTER — Encounter (HOSPITAL_BASED_OUTPATIENT_CLINIC_OR_DEPARTMENT_OTHER): Payer: Self-pay | Admitting: Emergency Medicine

## 2022-08-17 ENCOUNTER — Emergency Department (HOSPITAL_BASED_OUTPATIENT_CLINIC_OR_DEPARTMENT_OTHER)
Admission: EM | Admit: 2022-08-17 | Discharge: 2022-08-17 | Disposition: A | Discharge status: Home / Self Care | Payer: 59 | Attending: Emergency Medicine | Admitting: Emergency Medicine

## 2022-08-17 ENCOUNTER — Emergency Department (HOSPITAL_BASED_OUTPATIENT_CLINIC_OR_DEPARTMENT_OTHER): Payer: 59

## 2022-08-17 ENCOUNTER — Other Ambulatory Visit: Payer: Self-pay

## 2022-08-17 DIAGNOSIS — R1011 Right upper quadrant pain: Secondary | ICD-10-CM

## 2022-08-17 DIAGNOSIS — N3090 Cystitis, unspecified without hematuria: Secondary | ICD-10-CM | POA: Insufficient documentation

## 2022-08-17 DIAGNOSIS — Z7982 Long term (current) use of aspirin: Secondary | ICD-10-CM | POA: Insufficient documentation

## 2022-08-17 DIAGNOSIS — R102 Pelvic and perineal pain: Secondary | ICD-10-CM | POA: Diagnosis present

## 2022-08-17 LAB — COMPREHENSIVE METABOLIC PANEL
ALT: 23 U/L (ref 0–44)
AST: 18 U/L (ref 15–41)
Albumin: 4.3 g/dL (ref 3.5–5.0)
Alkaline Phosphatase: 63 U/L (ref 38–126)
Anion gap: 10 (ref 5–15)
BUN: 10 mg/dL (ref 6–20)
CO2: 28 mmol/L (ref 22–32)
Calcium: 9.9 mg/dL (ref 8.9–10.3)
Chloride: 102 mmol/L (ref 98–111)
Creatinine, Ser: 0.72 mg/dL (ref 0.44–1.00)
GFR, Estimated: >60 mL/min (ref >60)
Glucose, Bld: 155 mg/dL — ABNORMAL HIGH (ref 70–99)
Potassium: 4.1 mmol/L (ref 3.5–5.1)
Sodium: 140 mmol/L (ref 135–145)
Total Bilirubin: 0.5 mg/dL (ref 0.3–1.2)
Total Protein: 7.3 g/dL (ref 6.5–8.1)

## 2022-08-17 LAB — CBC WITH DIFFERENTIAL/PLATELET
Abs Immature Granulocytes: 0.03 10*3/uL (ref 0.00–0.07)
Basophils Absolute: 0 10*3/uL (ref 0.0–0.1)
Basophils Relative: 0 %
Eosinophils Absolute: 0.2 10*3/uL (ref 0.0–0.5)
Eosinophils Relative: 2 %
HCT: 38.7 % (ref 36.0–46.0)
Hemoglobin: 12.8 g/dL (ref 12.0–15.0)
Immature Granulocytes: 0 %
Lymphocytes Relative: 30 %
Lymphs Abs: 2 10*3/uL (ref 0.7–4.0)
MCH: 27.9 pg (ref 26.0–34.0)
MCHC: 33.1 g/dL (ref 30.0–36.0)
MCV: 84.3 fL (ref 80.0–100.0)
Monocytes Absolute: 0.4 10*3/uL (ref 0.1–1.0)
Monocytes Relative: 6 %
Neutro Abs: 4.2 10*3/uL (ref 1.7–7.7)
Neutrophils Relative %: 62 %
Platelets: 216 10*3/uL (ref 150–400)
RBC: 4.59 MIL/uL (ref 3.87–5.11)
RDW: 14.6 % (ref 11.5–15.5)
WBC: 6.8 10*3/uL (ref 4.0–10.5)
nRBC: 0 % (ref 0.0–0.2)

## 2022-08-17 LAB — URINALYSIS, ROUTINE W REFLEX MICROSCOPIC
Glucose, UA: NEGATIVE mg/dL
Hgb urine dipstick: NEGATIVE
Ketones, ur: NEGATIVE mg/dL
Nitrite: POSITIVE — AB
Protein, ur: NEGATIVE mg/dL
Specific Gravity, Urine: 1.007 (ref 1.005–1.030)
pH: 6 (ref 5.0–8.0)

## 2022-08-17 LAB — LIPASE, BLOOD: Lipase: 115 U/L — ABNORMAL HIGH (ref 11–51)

## 2022-08-17 MED ORDER — NITROFURANTOIN MONOHYD MACRO 100 MG PO CAPS: 100.0000 mg | ORAL_CAPSULE | Freq: Two times a day (BID) | ORAL | 0 refills | Status: AC

## 2022-08-17 NOTE — Discharge Instructions (Signed)
You were seen in the emergency department today for abdominal pain.  Your labs show that you do have cystitis which is inflammation of the bladder.  We are prescribing you Macrobid for this.  Additionally the ultrasound that we did of your liver and gallbladder show that you have some abnormalities in your liver that require follow-up imaging with an MRI.  Please follow-up with your primary care provider to have this ordered and completed.  Please return to the emergency department for worsening abdominal pain with fever, nausea and vomiting that is not allowing you to keep liquids down.

## 2022-08-17 NOTE — ED Notes (Signed)
Pt's IV removed, per Linus Orn - RN.

## 2022-08-17 NOTE — ED Provider Notes (Signed)
[facility] EMERGENCY DEPT Provider Note   CSN: [id number] Arrival date & time: 08/17/22  9562     History  Chief Complaint  Patient presents with   Pelvic Pain    Maria Glenn is a 56 y.o. female.  With past medical history of hyperlipidemia, factor V Leiden and DVT who presents to the emergency department with abdominal pain  States symptoms began yesterday evening. She describes having generalized abdominal discomfort yesterday. She states symptoms persisted overnight and then this morning felt ongoing pain that was more concentrated to her right side. She states she is having lower pelvic pain on the right side and having pressure when she urinates. States she tried Azo without relief of symptoms. She denies having nausea, vomiting, diarrhea, fevers, dysuria, vaginal discharge.    Pelvic Pain Associated symptoms include abdominal pain.       Home Medications Prior to Admission medications   Medication Sig Start Date End Date Taking? Authorizing Provider  nitrofurantoin, macrocrystal-monohydrate, (MACROBID) 100 MG capsule Take 1 capsule (100 mg total) by mouth 2 (two) times daily for 5 days. 08/17/22 08/22/22 Yes Mickie Hillier, PA-C  aspirin 81 MG chewable tablet Chew 81 mg by mouth daily.    [provider]  fexofenadine (ALLEGRA) 180 MG tablet Take 1 tablet (180 mg total) by mouth daily. 01/17/21   Opalski, Neoma Laming, DO  FLUoxetine (PROZAC) 10 MG tablet Take 1 tablet (10 mg total) by mouth daily. 08/09/22   Orma Render, NP  rosuvastatin (CRESTOR) 10 MG tablet Take 1 tablet (10 mg total) by mouth daily. 08/09/22   Orma Render, NP  SYNTHROID 88 MCG tablet Take 1 tablet (88 mcg total) by mouth daily. 08/09/22   Orma Render, NP      Allergies    Penicillins    Review of Systems   Review of Systems  Constitutional:  Negative for fever.  Gastrointestinal:  Positive for abdominal pain. Negative for diarrhea, nausea and vomiting.   Genitourinary:  Positive for pelvic pain. Negative for dysuria and vaginal discharge.  All other systems reviewed and are negative.   Physical Exam Updated Vital Signs BP 138/62 (BP Location: Left Arm)   Pulse 66   Temp 98.4 F (36.9 C)   Resp 15   SpO2 98%  Physical Exam Vitals and nursing note reviewed.  Constitutional:      General: She is not in acute distress.    Appearance: Normal appearance. She is obese. She is not ill-appearing or toxic-appearing.  HENT:     Head: Normocephalic.  Eyes:     General: No scleral icterus.    Extraocular Movements: Extraocular movements intact.  Cardiovascular:     Rate and Rhythm: Normal rate and regular rhythm.     Pulses: Normal pulses.  Pulmonary:     Effort: Pulmonary effort is normal. No respiratory distress.     Breath sounds: Normal breath sounds.  Abdominal:     General: Abdomen is protuberant. Bowel sounds are normal. There is no distension.     Palpations: Abdomen is soft.     Tenderness: There is abdominal tenderness in the right upper quadrant and epigastric area. There is no right CVA tenderness, left CVA tenderness, guarding or rebound. Negative signs include McBurney's sign.  Skin:    General: Skin is warm and dry.     Capillary Refill: Capillary refill takes less than 2 seconds.  Neurological:     General: No focal deficit present.  Mental Status: She is alert and oriented to person, place, and time.  Psychiatric:        Mood and Affect: Mood normal.        Behavior: Behavior normal.     ED Results / Procedures / Treatments   Labs (all labs ordered are listed, but only abnormal results are displayed) Labs Reviewed  COMPREHENSIVE METABOLIC PANEL - Abnormal; Notable for the following components:      Result Value   Glucose, Bld 155 (*)    All other components within normal limits  LIPASE, BLOOD - Abnormal; Notable for the following components:   Lipase 115 (*)    All other components within normal limits   URINALYSIS, ROUTINE W REFLEX MICROSCOPIC - Abnormal; Notable for the following components:   Bilirubin Urine SMALL (*)    Nitrite POSITIVE (*)    Leukocytes,Ua TRACE (*)    All other components within normal limits  CBC WITH DIFFERENTIAL/PLATELET   EKG None  Radiology US Abdomen Limited RUQ (LIVER/GB)  Result Date: 08/17/2022 CLINICAL DATA:  RIGHT upper quadrant abdominal pain a 56 year old female. EXAM: ULTRASOUND ABDOMEN LIMITED RIGHT UPPER QUADRANT COMPARISON:  Prior CT imaging from December 28, 2010. FINDINGS: Gallbladder: No gallstones or wall thickening visualized. No sonographic Murphy sign noted by sonographer. Common bile duct: Diameter: 3 mm Liver: Exam markedly limited by patient body habitus shows markedly heterogeneous hepatic echotexture with variable echogenicity. Potential focal lesion along the gallbladder fossa a versus is focal fat measuring 2.6 x 2.1 cm (image 39). Also with focal area of variable echogenicity near the porta hepatis (image 30) this measures 9 mm portal vein is patent on color Doppler imaging with normal direction of blood flow towards the liver. Other: None. IMPRESSION: Markedly heterogeneous hepatic echotexture. Question fissural widening. Findings raise the question of liver disease/cirrhosis. Potential focal hepatic lesions. Suggest nonemergent but prompt evaluation utilizing MR imaging, preferably as an outpatient when the patient is able to follow instructions and hold their breath. If further imaging is desired in the acute setting CT could be utilized as warranted. Electronically Signed   By: Zetta Bills M.D.   On: 08/17/2022 13:16    Procedures Procedures   Medications Ordered in ED Medications - No data to display  ED Course/ Medical Decision Making/ A&P                           Medical Decision Making Amount and/or Complexity of Data Reviewed Labs: ordered. Radiology: ordered.  Risk Prescription drug management.  Initial Impression and  Ddx 56 year old female who presents to the emergency department with abdominal pain.  She is well-appearing, nonseptic, nontoxic in appearance and hemodynamically stable and afebrile.  On my exam she does have abdominal tenderness to palpation most notably in the epigastric and right upper quadrant regions.  She describes having right lower quadrant/pelvic pressure when she is urinating but on palpation she is not particularly tender in this area. Ddx: Acute hepatobiliary disease, pancreatitis, appendicitis, PUD, gastritis, SBO, diverticulitis, colitis, viral gastroenteritis, Crohn's, UC, vascular catastrophe, UTI, pyelonephritis, renal stone, obstructed stone, infected stone, ovarian torsion, ectopic pregnancy, TOA, PID, STD, etc.  Patient PMH that increases complexity of ED encounter: Factor V Leiden  Interpretation of Diagnostics I independent reviewed and interpreted the labs as followed: CBC within normal limits, CMP with mildly elevated glucose, UA with positive nitrites and trace leukocytes, lipase is 115  - I independently visualized the following imaging with scope of  interpretation limited to determining acute life threatening conditions related to emergency care: Ultrasound right upper quadrant, which revealed markedly heterogenous hepatic echotexture question about liver disease versus cirrhosis.  Requests nonemergent and MR imaging  Patient Reassessment and Ultimate Disposition/Management On reassessment continues to be nonseptic, nontoxic in appearance. We discussed the findings of her labs which are consistent with cystitis.  Will prescribe her Macrobid for this. Discussed the findings of her right upper quadrant ultrasound which includes need for nonemergent MRI imaging with PCP.  She verbalized understanding.  Answered all of her questions about this.  Discussed return precautions for worsening symptoms.  Feel that her pressure symptoms in her pelvis are likely related to cystitis.   This is being treated.  On exam as noted above she had right upper quadrant tenderness.  This was evaluated by myself and the attending, Dr. Anne Shutter.  We had shared decision making where she agreed with right upper quadrant ultrasound and watchful waiting for any symptoms to worsen.  This was completed.  She is overall well-appearing and feel that she is safe for discharge at this time.  Her symptoms are inconsistent with other etiologies like a small bowel obstruction, vascular catastrophe, pyelonephritis, stone, obstructed stone, infected stone, ovarian torsion, TOA.  Symptoms are also consistent with a diverticulitis, colitis.  Her lipase is elevated 115 but symptoms are consistent with a pancreatitis.  Ruled out cholecystitis with right upper quadrant ultrasound.  Patient management required discussion with the following services or consulting groups:  None  Complexity of Problems Addressed Acute complicated illness or Injury  Additional Data Reviewed and Analyzed Further history obtained from: Recent PCP notes  Patient Encounter Risk Assessment Prescriptions  Final Clinical Impression(s) / ED Diagnoses Final diagnoses:  Right upper quadrant abdominal pain  Cystitis    Rx / DC Orders ED Discharge Orders          Ordered    nitrofurantoin, macrocrystal-monohydrate, (MACROBID) 100 MG capsule  2 times daily        08/17/22 1345              Mickie Hillier, PA-C 08/17/22 Flora, Ankit, MD 08/17/22 1443

## 2022-08-17 NOTE — ED Triage Notes (Signed)
Pt with right side pain, andpelvis area. Feels pressure when she voids.

## 2022-08-28 NOTE — Progress Notes (Signed)
Pt has been scheduled for 12-12 @ 1:30

## 2022-09-05 ENCOUNTER — Ambulatory Visit (HOSPITAL_BASED_OUTPATIENT_CLINIC_OR_DEPARTMENT_OTHER): Payer: 59 | Admitting: Family Medicine

## 2022-09-05 ENCOUNTER — Encounter (HOSPITAL_BASED_OUTPATIENT_CLINIC_OR_DEPARTMENT_OTHER): Payer: Self-pay | Admitting: Family Medicine

## 2022-09-05 DIAGNOSIS — R748 Abnormal levels of other serum enzymes: Secondary | ICD-10-CM

## 2022-09-05 DIAGNOSIS — Z800 Family history of malignant neoplasm of digestive organs: Secondary | ICD-10-CM

## 2022-09-05 DIAGNOSIS — K769 Liver disease, unspecified: Secondary | ICD-10-CM

## 2022-09-05 DIAGNOSIS — E119 Type 2 diabetes mellitus without complications: Secondary | ICD-10-CM | POA: Diagnosis not present

## 2022-09-05 NOTE — Assessment & Plan Note (Signed)
During ED evaluation, she was found to have slightly elevated lipase.  Given that symptoms were most likely related to urinary tract infection, feel that this mild elevation of lipase was less likely related to acute pancreatitis.  We can recheck labs today to monitor and ensure that this has returned to normal range.  Additionally, patient does report family history of pancreatic cancer and we will proceed with referral to specialist as above related to that

## 2022-09-05 NOTE — Assessment & Plan Note (Signed)
Patient did have labs recently with prior PCP and hemoglobin A1c was found to have increased from prediabetes range and is now at 7.1%.  This result was communicated by her previous PCP.  We also discussed of these findings today and that this indicates diagnosis of diabetes.  Related to this, recommendations are for close monitoring of hemoglobin A1c as well as consideration of pharmacotherapy to help with controlling blood sugars.  We did discuss metformin today as a potential consideration.  She has tried Ozempic in the past, however did not tolerate this due to side effects. Additionally, we will need to complete screening related to neuropathy, nephropathy.  We will also have to ensure ophthalmology evaluation for retinopathy screening At future visit we will additionally discuss pneumococcal vaccination recommendation

## 2022-09-05 NOTE — Progress Notes (Signed)
Procedures performed today:    None.  Independent interpretation of notes and tests performed by another provider:   None.  Brief History, Exam, Impression, and Recommendations:    BP 108/83 (BP Location: Right Arm, Patient Position: Sitting, Cuff Size: Large)   Pulse 72   Temp 97.8 F (36.6 C) (Oral)   Ht 5' 6.5" (1.689 m)   Wt 244 lb 3.2 oz (110.8 kg)   SpO2 100%   BMI 38.82 kg/m   Hepatic lesion Patient recently seen in the emergency department related to UTI.  During evaluation, she did have abdominal ultrasound completed which showed evidence of hepatic lesion.  Due to appearance on ultrasound, recommendation was for further evaluation with expedited MRI.  She is not currently having any abdominal symptoms. Reviewed imaging findings and recommendation for MRI to further evaluate/characterize findings seen on ultrasound.  She has some questions regarding this imaging as well as additional imaging related to reported family history of pancreatic cancer, this is discussed below  Family history of pancreatic cancer Patient reports family history of pancreatic cancer in sister.  She indicates that she did have genetic testing completed in the past which reportedly was normal and that he did not detect evidence of presence of known genetic mutations associated with pancreatic cancer.  She does indicate that similar findings were observed when her sister underwent genetic testing.  She reported that as a result of this, there were potential considerations for imaging as well as evaluation with gastroenterologist She has last seen gastroenterologist about 4 years ago. We discussed options today, she would prefer to hold off on MR imaging in case this would be recommended for pancreatic cancer screening.  We will place referral for patient to reestablish with gastroenterologist for further discussion of family history of pancreatic cancer and recommendations related to this We discussed  recommendation for obtaining MRI of liver to further evaluate findings from recent ultrasound.  She does not want to proceed with this at this time and would prefer to meet with gastroenterologist first and then determine next steps related to imaging to be completed  Diabetes mellitus ([place]) Patient did have labs recently with prior PCP and hemoglobin A1c was found to have increased from prediabetes range and is now at 7.1%.  This result was communicated by her previous PCP.  We also discussed of these findings today and that this indicates diagnosis of diabetes.  Related to this, recommendations are for close monitoring of hemoglobin A1c as well as consideration of pharmacotherapy to help with controlling blood sugars.  We did discuss metformin today as a potential consideration.  She has tried Ozempic in the past, however did not tolerate this due to side effects. Additionally, we will need to complete screening related to neuropathy, nephropathy.  We will also have to ensure ophthalmology evaluation for retinopathy screening At future visit we will additionally discuss pneumococcal vaccination recommendation  Elevated lipase During ED evaluation, she was found to have slightly elevated lipase.  Given that symptoms were most likely related to urinary tract infection, feel that this mild elevation of lipase was less likely related to acute pancreatitis.  We can recheck labs today to monitor and ensure that this has returned to normal range.  Additionally, patient does report family history of pancreatic cancer and we will proceed with referral to specialist as above related to that  Return in about 2 months (around 11/06/2022) for DM, GI referral.  Plan to check hemoglobin A1c around time of next  office visit   ___________________________________________ Lien Lyman de [place], MD, ABFM, [facility] and [place]

## 2022-09-05 NOTE — Assessment & Plan Note (Signed)
Patient recently seen in the emergency department related to UTI.  During evaluation, she did have abdominal ultrasound completed which showed evidence of hepatic lesion.  Due to appearance on ultrasound, recommendation was for further evaluation with expedited MRI.  She is not currently having any abdominal symptoms. Reviewed imaging findings and recommendation for MRI to further evaluate/characterize findings seen on ultrasound.  She has some questions regarding this imaging as well as additional imaging related to reported family history of pancreatic cancer, this is discussed below

## 2022-09-05 NOTE — Assessment & Plan Note (Signed)
Patient reports family history of pancreatic cancer in sister.  She indicates that she did have genetic testing completed in the past which reportedly was normal and that he did not detect evidence of presence of known genetic mutations associated with pancreatic cancer.  She does indicate that similar findings were observed when her sister underwent genetic testing.  She reported that as a result of this, there were potential considerations for imaging as well as evaluation with gastroenterologist She has last seen gastroenterologist about 4 years ago. We discussed options today, she would prefer to hold off on MR imaging in case this would be recommended for pancreatic cancer screening.  We will place referral for patient to reestablish with gastroenterologist for further discussion of family history of pancreatic cancer and recommendations related to this We discussed recommendation for obtaining MRI of liver to further evaluate findings from recent ultrasound.  She does not want to proceed with this at this time and would prefer to meet with gastroenterologist first and then determine next steps related to imaging to be completed

## 2022-09-06 LAB — LIPASE: Lipase: 28 U/L (ref 14–72)

## 2022-09-11 ENCOUNTER — Encounter: Payer: Self-pay | Admitting: Gastroenterology

## 2022-09-24 NOTE — Assessment & Plan Note (Signed)
Chronic. No new or concerning symptoms present at this time. Labs pending today. Refill for synthroid sent to pharmacy. Will make changes to the plan of care as necessary.

## 2022-09-24 NOTE — Assessment & Plan Note (Signed)
Labs pending today. No symptoms. Continue to monitor.

## 2022-09-24 NOTE — Assessment & Plan Note (Signed)
Not currently well managed with black co-hosh treatment. We will plan to start prozac today to see if this helps with her symptoms. Recommend giving the medication at least 6 weeks for maximum effectiveness. We can increase dose as needed if partial control is received. If no improvement after 6 weeks, recommend f/u for further evaluation and management.

## 2022-09-24 NOTE — Assessment & Plan Note (Signed)
Chronic. Refills on statin therapy provided. Labs pending. No current symptoms.

## 2022-09-24 NOTE — Assessment & Plan Note (Signed)
Chronic. Repeat labs for monitoring today.

## 2022-09-24 NOTE — Assessment & Plan Note (Signed)
Labs pending today.  

## 2022-10-16 ENCOUNTER — Ambulatory Visit: Payer: 59 | Admitting: Gastroenterology

## 2022-10-16 ENCOUNTER — Encounter: Payer: Self-pay | Admitting: Gastroenterology

## 2022-10-16 VITALS — BP 140/88 | HR 94 | Ht 66.0 in | Wt 252.0 lb

## 2022-10-16 DIAGNOSIS — K760 Fatty (change of) liver, not elsewhere classified: Secondary | ICD-10-CM | POA: Diagnosis not present

## 2022-10-16 DIAGNOSIS — R9389 Abnormal findings on diagnostic imaging of other specified body structures: Secondary | ICD-10-CM

## 2022-10-16 DIAGNOSIS — F419 Anxiety disorder, unspecified: Secondary | ICD-10-CM

## 2022-10-16 DIAGNOSIS — Z800 Family history of malignant neoplasm of digestive organs: Secondary | ICD-10-CM

## 2022-10-16 MED ORDER — DIAZEPAM 2 MG PO TABS: ORAL_TABLET | ORAL | 0 refills | Status: DC

## 2022-10-16 NOTE — Patient Instructions (Addendum)
You have been scheduled for an MRI / MRCP at [facility]  on 10/24/22. Your appointment time is 9:00 AM. Please arrive to admitting (at main entrance of the hospital) 30 minutes prior to your appointment time for registration purposes. Please make certain not to have anything to eat or drink 4 hours prior to your test. In addition, if you have any metal in your body, have a pacemaker or defibrillator, please be sure to let your ordering physician know. This test typically takes 45 minutes to 1 hour to complete. Should you need to reschedule, please call [phone] to do so.   You have been scheduled for an MRI at [facility] on 10/24/22. Your appointment time is 10:00 AM  We have sent the following medications to your pharmacy for you to pick up at your convenience: Valium 2 MG  ______________________________________________________  If your blood pressure at your visit was 140/90 or greater, please contact your primary care physician to follow up on this.  _______________________________________________________  We have sent the following medications to your pharmacy for you to pick up at your convenience:    If you are age 22 or younger, your body mass index should be between 19-25. Your Body mass index is 40.67 kg/m. If this is out of the aformentioned range listed, please consider follow up with your Primary Care Provider.   __________________________________________________________  The [place] [facility] providers would like to encourage you to use [facility] to communicate with providers for non-urgent requests or questions.  Due to long hold times on the telephone, sending your provider a message by [facility] may be a faster and more efficient way to get a response.  Please allow 48 business hours for a response.  Please remember that this is for non-urgent requests.   Due to recent changes in healthcare laws, you may see the results of your imaging and laboratory studies on MyChart before your  provider has had a chance to review them.  We understand that in some cases there may be results that are confusing or concerning to you. Not all laboratory results come back in the same time frame and the provider may be waiting for multiple results in order to interpret others.  Please give [place] 48 hours in order for your provider to thoroughly review all the results before contacting the office for clarification of your results.    Thank you for choosing me and [facility].  Vito Cirigliano, D.O.

## 2022-10-16 NOTE — Progress Notes (Signed)
Chief Complaint: Family history of Pancreatic Cancer, abnormal imaging study   Referring Provider:     de [place], Blondell Reveal, MD    HPI:     Maria Glenn is a 57 y.o. female with a history of diabetes (A1c 7.1%), HLD, factor V Leiden with prior DVT (on ASA 81 mg), hypothyroidism, referred to the Gastroenterology Clinic for evaluation of recent abnormal imaging study along with family history of Pancreatic Cancer.  Sister diagnosed with Pancreatic CA about 3 years ago (~age 43), now s/p Whipple and doing well. No other Pancreatic CA in the family. She reports that her sister had genetic testing completed and was normal/negative.  The patient herself is a underwent genetic testing through her GYN office and was negative/normal, although I cannot find the report anywhere.   Labs checked on 08/09/2022: - Normal CBC, CMP (BG 136), TSH, vitamin D, B12, folate - Hemoglobin A1c 7.1% (up from 6.3%)  Was seen in the ER on 08/17/2022 with LLQ abdominal pain - Normal CBC, BMP (BG 155) - Lipase 115 - UA with small bilirubin, positive nitrites, trace leukocytes - RUQ [place]: Marked hepatic steatosis, possible focal hepatic lesions along GB fossa vs focal fat measuring 2.6 x 2.1 cm.  Focal area of variable echogenicity near porta hepatis measuring 9 mm.  Normal Doppler - Was discharged home with Macrobid for possible cystitis  Was seen in follow-up by her PCM on 09/05/2022.  Repeat lipase was normal at 28.    Endoscopic History: - 08/09/2018: Colonoscopy: 2 mm rectal hyperplastic polyp, hypertrophied anal papilla, otherwise normal.  Repeat 10 years   Past Medical History:  Diagnosis Date   Allergy    At risk for dehydration 02/14/2021   B12 deficiency    BMI 38.0-38.9,adult 12/13/2020   Class 2 severe obesity with serious comorbidity and body mass index (BMI) of 39.0 to 39.9 in adult [facility]) 06/09/2020   Clotting disorder ([place])    factor V Leiden   DVT (deep venous thrombosis)  (HCC)    Factor V Leiden ([place])    History of blood clots    History of DVT (deep vein thrombosis) 08/09/2015   Hyperlipidemia    Hypothyroidism    Left leg DVT ([employer]) 08/09/2015   Non-recurrent acute allergic otitis media of right ear 12/13/2020   Other fatigue 12/07/2020   Other hyperlipidemia 12/07/2020   Other specified hypothyroidism 12/07/2020   Prediabetes    Sinobronchitis 12/13/2020   SOBOE (shortness of breath on exertion)    SOBOE (shortness of breath on exertion) 12/07/2020   Thyroid disease    Trigeminal neuralgia    Visit for preventive health examination 11/30/2016   Vitamin D deficiency      Past Surgical History:  Procedure Laterality Date   CESAREAN SECTION     Family History  Problem Relation Age of Onset   Hypertension Mother    Hyperlipidemia Mother    Cancer Father        Lung   Pancreatic cancer Sister    Colon cancer Neg Hx    Colon polyps Neg Hx    Esophageal cancer Neg Hx    Stomach cancer Neg Hx    Rectal cancer Neg Hx    Social History   Tobacco Use   Smoking status: Never   Smokeless tobacco: Never  Vaping Use   Vaping Use: Never used  Substance Use Topics   Alcohol use: No  Alcohol/week: 0.0 standard drinks of alcohol   Drug use: No   Current Outpatient Medications  Medication Sig Dispense Refill   aspirin 81 MG chewable tablet Chew 81 mg by mouth daily.     fexofenadine (ALLEGRA) 180 MG tablet Take 1 tablet (180 mg total) by mouth daily. 90 tablet 3   FLUoxetine (PROZAC) 10 MG tablet Take 1 tablet (10 mg total) by mouth daily. 90 tablet 3   rosuvastatin (CRESTOR) 10 MG tablet Take 1 tablet (10 mg total) by mouth daily. 90 tablet 3   SYNTHROID 88 MCG tablet Take 1 tablet (88 mcg total) by mouth daily. 90 tablet 3   No current facility-administered medications for this visit.   Allergies  Allergen Reactions   Penicillins Shortness Of Breath     Review of Systems: All systems reviewed and negative except where noted in HPI.      Physical Exam:    Wt Readings from Last 3 Encounters:  10/16/22 252 lb (114.3 kg)  09/05/22 244 lb 3.2 oz (110.8 kg)  08/09/22 247 lb (112 kg)    BP (!) 140/88   Pulse 94   Ht '5\' 6"'$  (1.676 m)   Wt 252 lb (114.3 kg)   SpO2 98%   BMI 40.67 kg/m  Constitutional:  Pleasant, in no acute distress. Psychiatric: Normal mood and affect. Behavior is normal. EENT: Pupils normal.  Conjunctivae are normal. No scleral icterus. Neck supple. No cervical LAD. Cardiovascular: Normal rate, regular rhythm. No edema Pulmonary/chest: Effort normal and breath sounds normal. No wheezing, rales or rhonchi. Abdominal: Soft, nondistended, nontender. Bowel sounds active throughout. There are no masses palpable. No hepatomegaly. Neurological: Alert and oriented to person place and time. Skin: Skin is warm and dry. No rashes noted.   ASSESSMENT AND PLAN;   1) Family History of Pancreatic Cancer: From most recent Spelter, ASGE, and AGA guidelines, screening for pancreatic cancer recommended for select high risk individuals.  This patient meets the high risk screening cohort based on the following:  -Family history of Pancreatic Cancer.  While we typically start PC screening for patients with family kindred syndrome or family history plus genetic abnormality, there is a paucity of literature with regards to first-degree relatives who are genetically negative for pathologic mutation.  We discussed that it is possible that her sister had a sporadic pancreatic cancer  vs genetically predisposed from a gene that has yet to be identified.  We discussed those possibilities at length today, and the patient is highly concerned about the latter and would like to proceed with PC screening.  Based on current literature and this rapidly evolving field, I think this is a very reasonable approach.   Recommended age to start surveillance varies by gene mutation status and family history, as  follows: -Familial pancreatic cancer kindred (without a known germline mutation)-start at age 14, or 64 years younger than the youngest affected blood relative  Screening techniques include the following: -Baseline: MRI/MRCP plus EUS plus fasting blood glucose and/or hemoglobin A1c  -Follow-up/surveillance phase: Alternate MRI/MRCP and EUS plus routine fasting blood glucose and/or hemoglobin A1c -If concerning features on imaging, check CA 19-9 -EUS with FNA for solid lesions ?5 mm, cystic lesions with worrisome features, or asymptomatic main pancreatic duct (MPD) strictures (with or without mass) -CT only for solid lesions, regardless of size, or asymptomatic MPD strictures of unknown etiology (without mass)  -Screening interval every 12 months in patients with no abnormalities/nonconcerning abnormalities -Screening interval every 3-6 months in  patients with abnormalities that are NOT suspicious for malignancy, but are concerning -EUS evaluation should be performed within 3-6 months for indeterminate lesions (abnormalities that are NOT suspicious for malignancy, but are concerning) -EUS evaluation should be performed within 3 months for high-risk lesions, if surgical resection is not planned. -Immediate surgical referral for abnormality suspicious for malignancy  -New-onset diabetes in a high-risk individual should lead to additional diagnostic studies or change in surveillance interval  -Genetic testing and counseling should be considered for familial pancreas cancer relatives who are eligible for surveillance. A positive germline mutation is associated with an increased risk of neoplastic progression and may also lead to screening for other relevant associated cancers -Participation in a registry or referral to a pancreas Center of Excellence should be pursued when possible for high-risk patients undergoing pancreas cancer screening -The target detectable pancreatic neoplasms are resectable  stage I pancreatic ductal adenocarcinoma and high-risk precursor neoplasms, such as intraductal papillary mucinous neoplasms with high-grade dysplasia and some enlarged pancreatic intraepithelial neoplasias  -We discussed the limitations and potential risks of pancreas cancer screening prior to initiating any screening program, and the patient wishes to proceed with screening. Plan for the following:  - MRI/MRCP now [imaging should be ordered using C25.9 pancreatic preventative screening code] - Hgb A1c recently done and 7.1% - EUS in 6 months - RTC every 6 months - If index MRI/MRCP and EUS are unrevealing/normal, per protocol, will liberalize to annual screening, alternating between MRI/MRCP and EUS  2) Hepatic steatosis 3) Abnormal imaging study Recent ultrasound with hepatic steatosis.  Otherwise preserved hepatic synthetic function by serologies and imaging.  Ultrasound also with possible hepatic lesions vs focal fatty sparing. - Plan for MRI three-phase liver at the time of MRI/MRCP as above for further characterization - Depending on results of the study, discussed fib 4 score and possible ultrasound elastography  4) Anxiety - Prescribed Valium to take prior to MRI for procedures/imaging related anxiety  RTC in 6 months or sooner as needed     Lavena Bullion, DO, FACG  10/16/2022, 10:17 AM   [place], Blondell Reveal, MD

## 2022-10-17 ENCOUNTER — Other Ambulatory Visit (HOSPITAL_BASED_OUTPATIENT_CLINIC_OR_DEPARTMENT_OTHER): Payer: Self-pay

## 2022-10-24 ENCOUNTER — Ambulatory Visit (HOSPITAL_COMMUNITY)
Admission: RE | Admit: 2022-10-24 | Discharge: 2022-10-24 | Disposition: A | Discharge status: Home / Self Care | Payer: 59 | Source: Ambulatory Visit | Attending: Gastroenterology | Admitting: Gastroenterology

## 2022-10-24 DIAGNOSIS — Z800 Family history of malignant neoplasm of digestive organs: Secondary | ICD-10-CM

## 2022-10-24 DIAGNOSIS — F419 Anxiety disorder, unspecified: Secondary | ICD-10-CM | POA: Diagnosis present

## 2022-10-24 DIAGNOSIS — R9389 Abnormal findings on diagnostic imaging of other specified body structures: Secondary | ICD-10-CM | POA: Diagnosis not present

## 2022-10-24 DIAGNOSIS — K760 Fatty (change of) liver, not elsewhere classified: Secondary | ICD-10-CM | POA: Diagnosis not present

## 2022-10-24 MED ORDER — GADOBUTROL 1 MMOL/ML IV SOLN
10.0000 mL | Freq: Once | INTRAVENOUS | Status: AC | PRN
  Administered 2022-10-24: 10 mL via INTRAVENOUS

## 2022-11-06 ENCOUNTER — Ambulatory Visit (HOSPITAL_BASED_OUTPATIENT_CLINIC_OR_DEPARTMENT_OTHER): Payer: 59 | Admitting: Family Medicine

## 2022-12-18 DIAGNOSIS — Z0289 Encounter for other administrative examinations: Secondary | ICD-10-CM

## 2022-12-20 ENCOUNTER — Encounter (INDEPENDENT_AMBULATORY_CARE_PROVIDER_SITE_OTHER): Payer: 59 | Admitting: Family Medicine

## 2022-12-27 ENCOUNTER — Encounter (INDEPENDENT_AMBULATORY_CARE_PROVIDER_SITE_OTHER): Payer: Self-pay | Admitting: Family Medicine

## 2022-12-27 ENCOUNTER — Ambulatory Visit (INDEPENDENT_AMBULATORY_CARE_PROVIDER_SITE_OTHER): Payer: 59 | Admitting: Family Medicine

## 2022-12-27 VITALS — BP 136/87 | HR 82 | Temp 98.0°F | Ht 66.0 in | Wt 252.4 lb

## 2022-12-27 DIAGNOSIS — E038 Other specified hypothyroidism: Secondary | ICD-10-CM | POA: Diagnosis not present

## 2022-12-27 DIAGNOSIS — E7849 Other hyperlipidemia: Secondary | ICD-10-CM | POA: Diagnosis not present

## 2022-12-27 DIAGNOSIS — K760 Fatty (change of) liver, not elsewhere classified: Secondary | ICD-10-CM | POA: Insufficient documentation

## 2022-12-27 DIAGNOSIS — R0602 Shortness of breath: Secondary | ICD-10-CM | POA: Diagnosis not present

## 2022-12-27 DIAGNOSIS — R7303 Prediabetes: Secondary | ICD-10-CM | POA: Diagnosis not present

## 2022-12-27 DIAGNOSIS — R5383 Other fatigue: Secondary | ICD-10-CM | POA: Diagnosis not present

## 2022-12-27 DIAGNOSIS — Z1331 Encounter for screening for depression: Secondary | ICD-10-CM | POA: Diagnosis not present

## 2022-12-27 DIAGNOSIS — F5089 Other specified eating disorder: Secondary | ICD-10-CM

## 2022-12-27 DIAGNOSIS — Z6841 Body Mass Index (BMI) 40.0 and over, adult: Secondary | ICD-10-CM

## 2022-12-27 DIAGNOSIS — F509 Eating disorder, unspecified: Secondary | ICD-10-CM | POA: Insufficient documentation

## 2022-12-27 NOTE — Progress Notes (Signed)
Maria Glenn, D.O.  ABFM, ABOM Specializing in Clinical Bariatric Medicine Office located at: [address] [place], [place]  [address]     Initial Bariatric Medicine Consultation Visit  Dear de [place], Buren Kos, MD   Thank you for referring Maria Glenn to our clinic today for evaluation.  We performed a consultation to discuss her options for treatment and educate the patient on her disease state.  The following note includes my evaluation and treatment recommendations.   Please do not hesitate to reach out to me directly if you have any further concerns.    Assessment and Plan:   Orders Placed This Encounter  Procedures   CBC with Differential/Platelet   Comprehensive metabolic panel   Hemoglobin A1c   Insulin, random   Lipid Panel With LDL/HDL Ratio   T4, free   TSH   Vitamin B12   VITAMIN D 25 Hydroxy (Vit-D Deficiency, Fractures)   EKG 12-Lead    Medications Discontinued During This Encounter  Medication Reason   diazepam (VALIUM) 2 MG tablet Completed Course   FLUoxetine (PROZAC) 10 MG tablet Completed Course      1) Fatigue Assessment: Condition is Not optimized. Maria Glenn does feel that her weight is causing her energy to be lower than it should be. Fatigue may be related to obesity, depression or many other causes. she does not appear to have any red flag symptoms and this appears to most likely related to her current lifestyle habits and dietary intake.  Plan: Labs will be ordered and reviewed with her at their next office visit in two weeks In the meanwhile, Maria Glenn will focus on self care including making healthy food choices by following their meal plan, improving sleep quality and focusing on stress reduction.  Once we are assured she is on an appropriate meal plan, we will start discussing exercise to increase cardiovascular fitness levels.    2) Shortness of breath on exertion Assessment: Condition is Not optimized. Maria Glenn does not feel that  she gets out of breath more easily than she used to when she exercises and seems to be worsening over time with weight gain.  This has  gotten worse recently. Maria Glenn denies shortness of breath at rest or orthopnea. Maria Glenn's shortness of breath appears to be obesity related and exercise induced, as they do not appear to have any "red flag" symptoms/ concerns today.   Plan: Patient agreed to work on weight loss at this time.  As Maria Glenn progresses through our weight loss program, we will gradually increase exercise as tolerated to treat her current condition.   If Maria Glenn follows our recommendations and loses 5-10% of their weight without improvement of her shortness of breath or if at any time, symptoms become more concerning, they agree to urgently follow up with their PCP/ specialist for further consideration/ evaluation.   Maria Glenn verbalizes agreement with this plan.    Other specified hypothyroidism Assessment: Condition is stable.  Lab Results  Component Value Date   TSH 2.420 08/09/2022   FREET4 1.23 08/09/2022  Patient is compliant with Synthroid 88 mcg daily. Denies any side effects or concerns  Plan: Check labs. Continue with med as recommended by PCP.      Other hyperlipidemia Assessment: Condition is stable.  Lab Results  Component Value Date   CHOL 176 08/09/2022   HDL 70 08/09/2022   LDLCALC 83 08/09/2022   TRIG 135 08/09/2022   CHOLHDL 2.5 08/09/2022  No issues with Crestor 10 mg  daily, compliance good.   Plan: Check labs. Continue with med as recommended by PCP/specialist.  - I stressed the importance that patient start with our prudent nutritional plan to improve this condition - She will advance exercise and cardiovascular fitness as tolerated.    Prediabetes Assessment: Condition is Not optimized. Lab Results  Component Value Date   HGBA1C 6.3 (H) 02/06/2022   HGBA1C 6.0 (H) 08/10/2021   INSULIN 8.3 03/14/2021   INSULIN 13.0 12/07/2020   INSULIN 8.7 03/23/2020  Pt  denies every being told she has DM.  Her Hemoglobin A1c was 6.2 on March 2018.   Patient is not on any medication currently for prediabetes. This is diet controlled.  Patient endorses that she skips breakfast 2-3 times a week.    Plan: Check labs - Begin her prudent nutritional plan and continue to advance exercise and cardiovascular fitness as tolerated.  - Begin to decrease simple carbs/ sugars; increase fiber and proteins -> follow her meal plan.     NAFLD (nonalcoholic fatty liver disease) Assessment:   Patient has a family history of pancreatic cancer. On January 2024, she had an MRI of her abdomen which showed mild hepatic steatosis. She drinks alcohol occasionally.   Plan: Continue with specialist regarding treatment of NAFLD. Start her prudent nutritional plan that is low in simple carbohydrates, saturated fats and trans fats to goal of 5-10% weight loss to achieve significant health benefits.  Pt encouraged to advance exercise and cardiovascular fitness as tolerated throughout weight loss journey.    Other disorder of eating-Emotional Eating Assessment: Condition is stable. Patient endorses that she eats when stressed, bored, and to help comfort herself.   Plan: Check labs - I informed patient about Dr.Barker, our bariatric psychologist, who helps patients with emotional eating.  - Patient declined to see Dr.Barker for now.  - Begin her prudent nutritional plan and continue to advance exercise and cardiovascular fitness as tolerated.    TREATMENT PLAN FOR OBESITY: BMI 40.0-44.9, adult-surrent bmi 40.7 Morbid obesity-start bmi 40.7/date4/3/24 Assessment: Condition is Not optimized.  Fat mass is 114.4 lbs. Muscle mass is 131 lbs. Total body water is 87.6 lbs.   Plan: Maria Glenn will begin the Category 2 meal plan with breakfast and lunch options. She will journal as desired (100+ protein, 1300-1400 calories).    Behavioral Intervention Additional resources provided today:  category 2 meal plan information, breakfast options, lunch options, and journaling log Evidence-based interventions for health behavior change were utilized today including the discussion of self monitoring techniques, problem-solving barriers and SMART goal setting techniques.   Regarding patient's less desirable eating habits and patterns, we employed the technique of small changes.  Pt will specifically work on: beginning the Category 2 meal plan with breakfast and lunch options + Journaling as desired (100+ protein and 1300-1400 calories) for next visit.    Recommended Physical Activity Goals Maria Glenn has been advised to work up to 150 minutes of moderate intensity aerobic activity a week and strengthening exercises 2-3 times per week for cardiovascular health, weight loss maintenance and preservation of muscle mass.  She has agreed to continue their current level of activity  FOLLOW UP: Follow up in 2 weeks. She was informed of the importance of frequent follow up visits to maximize her success with intensive lifestyle modifications for her multiple health conditions.  Maria Glenn is aware that we will review all of her lab results at our next visit.  She is aware that if anything is critical/ life threatening  with the results, we will be contacting her via MyChart prior to the office visit to discuss management.     Chief Complaint:   OBESITY Maria Glenn (MR# [id number]) is a 57 y.o. female who presents for evaluation and treatment of obesity and related comorbidities. Current BMI is Body mass index is 40.74 kg/m. Maria Glenn has been struggling with her weight for many years and has been unsuccessful in either losing weight, maintaining weight loss, or reaching her healthy weight goal.  Maria Glenn is currently in the action stage of change and ready to dedicate time achieving and maintaining a healthier weight. Maria Glenn is interested in becoming our patient and working on intensive  lifestyle modifications including (but not limited to) diet and exercise for weight loss.  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 Patient is married to Maria Glenn  and has 1 child. She lives with her husband and her son.  She has tried weight watchers and [employer] sky. She lost 75 lbs on both these programs.   She eats out about 1-2 times per week. She skips breakfast 2-3 times a week.   She craves and snacks on sweets. Her worst eating habit is eating when not hungry.   Depression Screen Her Food and Mood (modified PHQ-9) score was 10.  Sleep habits Her ESS score is 3.  Maria Glenn admits to daytime somnolence and admits to waking up still tired. Patient has a history of symptoms of daytime fatigue. Maria Glenn generally does sleep enough hours per night. and states that she has unrestful sleep. Snoring is sometimes present. Apneic episodes is not present.    Subjective:   This is the patient's first visit at [facility].  The patient's NEW PATIENT PACKET that they filled out prior to today's office visit was reviewed at length and information from that paperwork was included within the following office visit note.    Included in the packet: current and past health history, medications, allergies, ROS, gynecologic history (women only), surgical history, family history, social history, weight history, weight loss surgery history (for those that have had weight loss surgery), nutritional evaluation, mood and food questionnaire along with a depression screening (PHQ9) on all patients, an Epworth questionnaire, sleep habits questionnaire, patient life and health improvement goals questionnaire. These will all be scanned into the patient's chart under the "media" tab.   Review of Systems: Please refer to new patient packet scanned into media. Pertinent positives were addressed with patient today.   Objective:   PHYSICAL EXAM: Blood pressure 136/87, pulse 82, temperature 98 F (36.7 C),  height 5\' 6"  (1.676 m), weight 252 lb 6.4 oz (114.5 kg), SpO2 96 %. Body mass index is 40.74 kg/m. General: Well Developed, well nourished, and in no acute distress.  HEENT: Normocephalic, atraumatic Skin: Warm and dry, cap RF less 2 sec, good turgor Chest:  Normal excursion, shape, no gross abn Respiratory: speaking in full sentences, no conversational dyspnea NeuroM-Sk: Ambulates w/o assistance, moves * 4 Psych: A and O *3, insight good, mood-full  EKG: Normal sinus rhythm, rate 80 bpm. No acute abnormalities.   Indirect Calorimeter completed today shows a VO2 of 261 and a REE of 1800.  Her calculated basal metabolic rate is 5374 thus her measured basal metabolic rate is worse than expected.  Anthropometric Measurements Height: 5\' 6"  (1.676 m) Weight: 252 lb 6.4 oz (114.5 kg) BMI (Calculated): 40.76 Starting Weight: 252.4lb Peak Weight: 252.4lb Waist Measurement : 44 inches   Body  Composition  Body Fat %: 45.3 % Fat Mass (lbs): 114.4 lbs Muscle Mass (lbs): 131 lbs Total Body Water (lbs): 87.6 lbs Visceral Fat Rating : 14   Other Clinical Data RMR: 1800 Fasting: yes Labs: yes Today's Visit #: 1 Starting Date: 12/27/22    DIAGNOSTIC DATA REVIEWED:  BMET    Component Value Date/Time   NA 140 08/17/2022 1016   NA 140 08/09/2022 0934   K 4.1 08/17/2022 1016   CL 102 08/17/2022 1016   CO2 28 08/17/2022 1016   GLUCOSE 155 (H) 08/17/2022 1016   BUN 10 08/17/2022 1016   BUN 14 08/09/2022 0934   CREATININE 0.72 08/17/2022 1016   CALCIUM 9.9 08/17/2022 1016   GFRNONAA >60 08/17/2022 1016   GFRAA 113 03/23/2020 1439   Lab Results  Component Value Date   HGBA1C 7.1 (H) 08/09/2022   HGBA1C 6.2 11/30/2016   Lab Results  Component Value Date   INSULIN 8.3 03/14/2021   INSULIN 8.7 03/23/2020   Lab Results  Component Value Date   TSH 2.420 08/09/2022   CBC    Component Value Date/Time   WBC 6.8 08/17/2022 1016   RBC 4.59 08/17/2022 1016   HGB 12.8  08/17/2022 1016   HGB 13.6 08/09/2022 0934   HGB 12.5 08/09/2015 1020   HCT 38.7 08/17/2022 1016   HCT 41.2 08/09/2022 0934   HCT 37.8 08/09/2015 1020   PLT 216 08/17/2022 1016   PLT 231 08/09/2022 0934   MCV 84.3 08/17/2022 1016   MCV 86 08/09/2022 0934   MCV 86 08/09/2015 1020   MCH 27.9 08/17/2022 1016   MCHC 33.1 08/17/2022 1016   RDW 14.6 08/17/2022 1016   RDW 14.4 08/09/2022 0934   RDW 14.3 08/09/2015 1020   Iron Studies No results found for: "IRON", "TIBC", "FERRITIN", "IRONPCTSAT" Lipid Panel     Component Value Date/Time   CHOL 176 08/09/2022 0934   TRIG 135 08/09/2022 0934   HDL 70 08/09/2022 0934   CHOLHDL 2.5 08/09/2022 0934   CHOLHDL 4 01/22/2020 0906   VLDL 12.4 01/22/2020 0906   LDLCALC 83 08/09/2022 0934   Hepatic Function Panel     Component Value Date/Time   PROT 7.3 08/17/2022 1016   PROT 7.4 08/09/2022 0934   ALBUMIN 4.3 08/17/2022 1016   ALBUMIN 4.6 08/09/2022 0934   AST 18 08/17/2022 1016   ALT 23 08/17/2022 1016   ALKPHOS 63 08/17/2022 1016   BILITOT 0.5 08/17/2022 1016   BILITOT 0.4 08/09/2022 0934      Component Value Date/Time   TSH 2.420 08/09/2022 0934   Nutritional Lab Results  Component Value Date   VD25OH 30.2 08/09/2022   VD25OH 31.1 08/10/2021   VD25OH 34.1 03/14/2021    Attestation Statements:   Reviewed by clinician on day of visit: allergies, medications, problem list, medical history, surgical history, family history, social history, and previous encounter notes.  During the visit, I independently reviewed the patient's EKG, bioimpedance scale results, and indirect calorimeter results. I used this information to tailor a meal plan for the patient that will help VINESSA MACCONNELL to lose weight and will improve her obesity-related conditions going forward.  I performed a medically necessary appropriate examination and/or evaluation. I discussed the assessment and treatment plan with the patient. The patient was provided an  opportunity to ask questions and all were answered. The patient agreed with the plan and demonstrated an understanding of the instructions. Labs were ordered today (unless patient declined them) and  will be reviewed with the patient at our next visit unless more critical results need to be addressed immediately. Clinical information was updated and documented in the EMR.  50+ minutes spent with patient today on the above.  I,Special Puri,acting as a Neurosurgeon for [employer], DO.,have documented all relevant documentation on the behalf of Thomasene Lot, DO,as directed by  Thomasene Lot, DO while in the presence of Thomasene Lot, DO.   I, Thomasene Lot, DO, have reviewed all documentation for this visit. The documentation on 12/27/22 for the exam, diagnosis, procedures, and orders are all accurate and complete.

## 2022-12-28 LAB — VITAMIN B12: Vitamin B-12: 271 pg/mL (ref 232–1245)

## 2022-12-28 LAB — COMPREHENSIVE METABOLIC PANEL
ALT: 23 IU/L (ref 0–32)
AST: 21 IU/L (ref 0–40)
Albumin/Globulin Ratio: 1.7 (ref 1.2–2.2)
Albumin: 4.7 g/dL (ref 3.8–4.9)
Alkaline Phosphatase: 97 IU/L (ref 44–121)
BUN/Creatinine Ratio: 21 (ref 9–23)
BUN: 16 mg/dL (ref 6–24)
Bilirubin Total: 0.3 mg/dL (ref 0.0–1.2)
CO2: 23 mmol/L (ref 20–29)
Calcium: 9.9 mg/dL (ref 8.7–10.2)
Chloride: 102 mmol/L (ref 96–106)
Creatinine, Ser: 0.78 mg/dL (ref 0.57–1.00)
Globulin, Total: 2.8 g/dL (ref 1.5–4.5)
Glucose: 122 mg/dL — ABNORMAL HIGH (ref 70–99)
Potassium: 4.5 mmol/L (ref 3.5–5.2)
Sodium: 142 mmol/L (ref 134–144)
Total Protein: 7.5 g/dL (ref 6.0–8.5)
eGFR: 90 mL/min/{1.73_m2} (ref >59)

## 2022-12-28 LAB — CBC WITH DIFFERENTIAL/PLATELET
Basophils Absolute: 0 10*3/uL (ref 0.0–0.2)
Basos: 0 %
EOS (ABSOLUTE): 0.3 10*3/uL (ref 0.0–0.4)
Eos: 5 %
Hematocrit: 43 % (ref 34.0–46.6)
Hemoglobin: 14 g/dL (ref 11.1–15.9)
Immature Grans (Abs): 0 10*3/uL (ref 0.0–0.1)
Immature Granulocytes: 0 %
Lymphocytes Absolute: 2.7 10*3/uL (ref 0.7–3.1)
Lymphs: 39 %
MCH: 28.5 pg (ref 26.6–33.0)
MCHC: 32.6 g/dL (ref 31.5–35.7)
MCV: 88 fL (ref 79–97)
Monocytes Absolute: 0.4 10*3/uL (ref 0.1–0.9)
Monocytes: 6 %
Neutrophils Absolute: 3.5 10*3/uL (ref 1.4–7.0)
Neutrophils: 50 %
Platelets: 238 10*3/uL (ref 150–450)
RBC: 4.91 x10E6/uL (ref 3.77–5.28)
RDW: 13.6 % (ref 11.7–15.4)
WBC: 7 10*3/uL (ref 3.4–10.8)

## 2022-12-28 LAB — LIPID PANEL WITH LDL/HDL RATIO
Cholesterol, Total: 165 mg/dL (ref 100–199)
HDL: 69 mg/dL
LDL Chol Calc (NIH): 71 mg/dL (ref 0–99)
LDL/HDL Ratio: 1 ratio (ref 0.0–3.2)
Triglycerides: 150 mg/dL — ABNORMAL HIGH (ref 0–149)
VLDL Cholesterol Cal: 25 mg/dL (ref 5–40)

## 2022-12-28 LAB — T4, FREE: Free T4: 1.53 ng/dL (ref 0.82–1.77)

## 2022-12-28 LAB — HEMOGLOBIN A1C
Est. average glucose Bld gHb Est-mCnc: 163 mg/dL
Hgb A1c MFr Bld: 7.3 % — ABNORMAL HIGH (ref 4.8–5.6)

## 2022-12-28 LAB — VITAMIN D 25 HYDROXY (VIT D DEFICIENCY, FRACTURES): Vit D, 25-Hydroxy: 27.4 ng/mL — ABNORMAL LOW (ref 30.0–100.0)

## 2022-12-28 LAB — INSULIN, RANDOM: INSULIN: 15.9 u[IU]/mL (ref 2.6–24.9)

## 2022-12-28 LAB — TSH: TSH: 1.68 u[IU]/mL (ref 0.450–4.500)

## 2023-01-10 ENCOUNTER — Encounter (INDEPENDENT_AMBULATORY_CARE_PROVIDER_SITE_OTHER): Payer: Self-pay | Admitting: Family Medicine

## 2023-01-10 ENCOUNTER — Ambulatory Visit (INDEPENDENT_AMBULATORY_CARE_PROVIDER_SITE_OTHER): Payer: 59 | Admitting: Family Medicine

## 2023-01-10 VITALS — BP 131/81 | HR 76 | Temp 98.5°F | Ht 66.0 in | Wt 243.8 lb

## 2023-01-10 DIAGNOSIS — E538 Deficiency of other specified B group vitamins: Secondary | ICD-10-CM

## 2023-01-10 DIAGNOSIS — Z7984 Long term (current) use of oral hypoglycemic drugs: Secondary | ICD-10-CM

## 2023-01-10 DIAGNOSIS — E1169 Type 2 diabetes mellitus with other specified complication: Secondary | ICD-10-CM | POA: Diagnosis not present

## 2023-01-10 DIAGNOSIS — E038 Other specified hypothyroidism: Secondary | ICD-10-CM | POA: Diagnosis not present

## 2023-01-10 DIAGNOSIS — E7849 Other hyperlipidemia: Secondary | ICD-10-CM | POA: Diagnosis not present

## 2023-01-10 DIAGNOSIS — Z6839 Body mass index (BMI) 39.0-39.9, adult: Secondary | ICD-10-CM

## 2023-01-10 DIAGNOSIS — E559 Vitamin D deficiency, unspecified: Secondary | ICD-10-CM

## 2023-01-10 DIAGNOSIS — E669 Obesity, unspecified: Secondary | ICD-10-CM

## 2023-01-10 DIAGNOSIS — Z6841 Body Mass Index (BMI) 40.0 and over, adult: Secondary | ICD-10-CM

## 2023-01-10 MED ORDER — CYANOCOBALAMIN 500 MCG PO TABS: 500.0000 ug | ORAL_TABLET | Freq: Every day | ORAL | Status: DC

## 2023-01-10 MED ORDER — METFORMIN HCL 500 MG PO TABS: ORAL_TABLET | ORAL | 0 refills | Status: DC

## 2023-01-10 MED ORDER — VITAMIN D (ERGOCALCIFEROL) 1.25 MG (50000 UNIT) PO CAPS: 50000.0000 [IU] | ORAL_CAPSULE | ORAL | 0 refills | Status: DC

## 2023-01-10 NOTE — Progress Notes (Signed)
Maria Glenn, D.O.  ABFM, ABOM Specializing in Clinical Bariatric Medicine  Office located at: [address] [place], [place]  [address]     Assessment and Plan:    Meds ordered this encounter  Medications   Vitamin D, Ergocalciferol, (DRISDOL) 1.25 MG (50000 UNIT) CAPS capsule    Sig: Take 1 capsule (50,000 Units total) by mouth every 7 (seven) days.    Dispense:  4 capsule    Refill:  0   metFORMIN (GLUCOPHAGE) 500 MG tablet    Sig: 1/2 po with lunch and dinner daily    Dispense:  30 tablet    Refill:  0    30 d supply;  ** OV for RF **   Do not send RF request   cyanocobalamin (VITAMIN B12) 500 MCG tablet    Sig: Take 1 tablet (500 mcg total) by mouth daily.      Type 2 diabetes mellitus with obesity Assessment: Condition is new and Not optimized.. Labs were reviewed.  Lab Results  Component Value Date   HGBA1C 7.3 (H) 12/27/2022   HGBA1C 7.1 (H) 08/09/2022   HGBA1C 6.3 (H) 02/06/2022   INSULIN 15.9 12/27/2022   INSULIN 8.3 03/14/2021   INSULIN 13.0 12/07/2020   Lab Results  Component Value Date   WBC 7.0 12/27/2022   HGB 14.0 12/27/2022   HCT 43.0 12/27/2022   MCV 88 12/27/2022   PLT 238 12/27/2022   Lab Results  Component Value Date   CREATININE 0.78 12/27/2022   BUN 16 12/27/2022   NA 142 12/27/2022   K 4.5 12/27/2022   CL 102 12/27/2022   CO2 23 12/27/2022  I informed her that her A1c levels are greater than 6.4, which signifies that she is diabetic.  Her Sodium levels indicate that she may likely be dehydrated. Labs indicate that her kidney function is normal.  Patient is currently not on any prediabetic medication. This is diet controlled.  Patient endorses she has been on Ozempic in the past and it made her constipated and nauseated.  Patient endorses she has occasional carb cravings.  Plan:  We discussed the risks and benefits of Metformin, which can help [place] in the management of this disease process as well as with weight loss. Begin  Metformin 1/2 po with lunch and dinner daily. - Unless pre-existing renal or cardiopulmonary conditions exist which patient was told to limit their fluid intake by another provider, I recommended roughly one half of their weight in pounds, to be the approximate ounces of non-caloric, non-caffeinated beverages they should drink per day; including more if they are engaging in exercise.  - Continue to decrease simple carbs/ sugars; increase fiber and proteins -> follow her meal plan.   - Explained role of simple carbs and insulin levels on hunger and cravings - Maria Glenn will continue to work on weight loss, exercise, via their meal plan we devised to help decrease the risk of progressing to diabetes.     Other hyperlipidemia Assessment: Condition is Not optimized.. Labs were reviewed.  Lab Results  Component Value Date   CHOL 165 12/27/2022   HDL 69 12/27/2022   LDLCALC 71 12/27/2022   TRIG 150 (H) 12/27/2022   CHOLHDL 2.5 08/09/2022      Component Value Date/Time   PROT 7.5 12/27/2022 1013   ALBUMIN 4.7 12/27/2022 1013   AST 21 12/27/2022 1013   ALT 23 12/27/2022 1013   ALKPHOS 97 12/27/2022 1013   BILITOT 0.3 12/27/2022 1013  Her Triglyceride levels are slightly  elevated. Are LDL and HDL levels are normal. Her labs indicate that her liver function is normal. Patient has been compliant with Crestor 10 mg daily. Denies any side effects.   Plan: Continue with med as recommended by specialist - I stressed the importance that patient continue with our prudent nutritional plan that is low in saturated and trans fats, and low in fatty carbs to improve these numbers.  - We recommend: aerobic activity with eventual goal of a minimum of 150+ min wk plus 2 days/ week of resistance or strength training.   - We will continue routine screening as patient continues to achieve health goals along their weight loss journey       Other specified hypothyroidism Assessment: Condition is stable. Labs  were reviewed.  Lab Results  Component Value Date   TSH 1.680 12/27/2022   FREET4 1.53 12/27/2022  Labs indicate that her Thyroid levels are normal. She reports good compliance and tolerance with Synthroid 88 mcg daily. Denies any side effects.   Plan:Continue with med as recommended by specialist.       Vitamin D deficiency Assessment: Condition is new and Not optimized.. Labs were reviewed.  Lab Results  Component Value Date   VD25OH 27.4 (L) 12/27/2022   VD25OH 30.2 08/09/2022   VD25OH 31.1 08/10/2021  Patient's Vitamin D levels are under the recommended range of 50-100. She is currently not taking any Vitamin D supplement. This is diet controlled.   Plan: Begin Ergocalciferol 50K IU weekly  - I discussed the importance of vitamin D to the patient's health and well-being as well as to their ability to lose weight.  - I reviewed possible symptoms of low Vitamin D:  low energy, depressed mood, muscle aches, joint aches, osteoporosis etc. with patient - weight loss will likely improve availability of vitamin D, thus encouraged Maria Glenn to continue with meal plan and their weight loss efforts to further improve this condition.  Thus, we will need to monitor levels regularly (every 3-4 mo on average) to keep levels within normal limits and prevent over supplementation.    B12 deficiency Assessment: Condition is new and Not optimized.. Labs were reviewed.  Lab Results  Component Value Date   VITAMINB12 271 12/27/2022  Patients B12 levels are below the recommended value of 500+. She endorses feeling  tired/sluggish She is not currently taking any B12 supplement.   Plan: Begin Vitamin B12 500 mcg daily.  Continue her prudent nutritional plan that is low in simple carbohydrates, saturated fats and trans fats to goal of 5-10% weight loss to achieve significant health benefits.  Pt encouraged to continually advance exercise and cardiovascular fitness as tolerated throughout weight loss  journey.    TREATMENT PLAN FOR OBESITY: BMI 40.0-44.9, adult-surrent bmi 39.3 Morbid obesity-start bmi 40.7/date 12/27/22 Assessment: Condition is improving, but not optimized. Biometric data collected today, was reviewed with patient.  Fat mass has decreased by 8.6lb. Muscle mass has not changed. Total body water has decreased by 2.8lb.   Plan:  Mikinzie is currently in the action stage of change. As such, her goal is to continue weight management plan. Saryna will work on healthier eating habits and try their best to continue with the Category 2 Plan with breakfast and lunch options and Journaling 100+ protein, 1300-1400 calories  Behavioral Intervention Additional resources provided today:  Metformin  handout  Evidence-based interventions for health behavior change were utilized today including the discussion of self monitoring techniques, problem-solving barriers  and SMART goal setting techniques.   Regarding patient's less desirable eating habits and patterns, we employed the technique of small changes.  Pt will specifically work on: increasing her protein intake  for next visit.    Recommended Physical Activity Goals Francies has been advised to work up to 150 minutes of moderate intensity aerobic activity a week and strengthening exercises 2-3 times per week for cardiovascular health, weight loss maintenance and preservation of muscle mass.  She has agreed to Continue current level of physical activity    FOLLOW UP: Return in about 2 weeks (around 01/24/2023). She was informed of the importance of frequent follow up visits to maximize her success with intensive lifestyle modifications for her multiple health conditions.   Subjective:   Chief complaint: Obesity Kearie is here to discuss her progress with her obesity treatment plan. She is on the the Category 2 Plan with breakfast and lunch options and Journaling 100+ protein, 1300-1400 calories and states she is following her eating plan  approximately 95% of the time. She states she is not exercising.  Interval History:  MONAY HOULTON is here today for her first follow-up office visit since starting the program with [place].  Since last office visit she significantly reduced her junk food intake. She states that it was not very difficult to follow the plan. She endorses feeling hungry in the middle of the night the first week of following the plan, but the hunger decreased in the second week. She endorses occasionally craving carbs. She has been weighing her proteins. At lunch, she eats 4-5 oz of protein at lunch. She reports drinking sweetened coffee during the day.  All blood work/ lab tests that were recently ordered by myself or an outside provider were reviewed with patient today per their request. Extended time was spent counseling her on all new disease processes that were discovered or preexisting ones that are affected by BMI.  she understands that many of these abnormalities will need to monitored regularly along with the current treatment plan of prudent dietary changes, in which we are making each and every office visit, to improve these health parameters.  Review of Systems:  Pertinent positives were addressed with patient today.  Weight Summary and Biometrics   Weight Lost Since Last Visit: 9lb  Weight Gained Since Last Visit: 0   Vitals Temp: 98.5 F (36.9 C) BP: 131/81 Pulse Rate: 76 SpO2: 94 %   Anthropometric Measurements Height:  (1.676 m) Weight: 243 lb 12.8 oz (110.6 kg) BMI (Calculated): 39.37 Weight at Last Visit: 252LB Weight Lost Since Last Visit: 9lb Weight Gained Since Last Visit: 0 Starting Weight: 252.4LB Total Weight Loss (lbs): 9 lb (4.082 kg) Peak Weight: 252.4LB   Body Composition  Body Fat %: 43.4 % Fat Mass (lbs): 105.8 lbs Muscle Mass (lbs): 131 lbs Total Body Water (lbs): 84.8 lbs Visceral Fat Rating : 13   Other Clinical Data Fasting: NO Labs: NO Today's Visit #:  2 Starting Date: 12/27/22     Objective:   PHYSICAL EXAM:  Blood pressure 131/81, pulse 76, temperature 98.5 F (36.9 C), height  (1.676 m), weight 243 lb 12.8 oz (110.6 kg), SpO2 94 %. Body mass index is 39.35 kg/m.  General: Well Developed, well nourished, and in no acute distress.  HEENT: Normocephalic, atraumatic Skin: Warm and dry, cap RF less 2 sec, good turgor Chest:  Normal excursion, shape, no gross abn Respiratory: speaking in full sentences, no conversational dyspnea NeuroM-Sk: Ambulates w/o  assistance, moves * 4 Psych: A and O *3, insight good, mood-full  DIAGNOSTIC DATA REVIEWED:  BMET    Component Value Date/Time   NA 142 12/27/2022 1013   K 4.5 12/27/2022 1013   CL 102 12/27/2022 1013   CO2 23 12/27/2022 1013   GLUCOSE 122 (H) 12/27/2022 1013   GLUCOSE 155 (H) 08/17/2022 1016   BUN 16 12/27/2022 1013   CREATININE 0.78 12/27/2022 1013   CALCIUM 9.9 12/27/2022 1013   GFRNONAA >60 08/17/2022 1016   GFRAA 113 03/23/2020 1439   Lab Results  Component Value Date   HGBA1C 7.3 (H) 12/27/2022   HGBA1C 6.2 11/30/2016   Lab Results  Component Value Date   INSULIN 15.9 12/27/2022   INSULIN 8.7 03/23/2020   Lab Results  Component Value Date   TSH 1.680 12/27/2022   CBC    Component Value Date/Time   WBC 7.0 12/27/2022 1013   WBC 6.8 08/17/2022 1016   RBC 4.91 12/27/2022 1013   RBC 4.59 08/17/2022 1016   HGB 14.0 12/27/2022 1013   HGB 12.5 08/09/2015 1020   HCT 43.0 12/27/2022 1013   HCT 37.8 08/09/2015 1020   PLT 238 12/27/2022 1013   MCV 88 12/27/2022 1013   MCV 86 08/09/2015 1020   MCH 28.5 12/27/2022 1013   MCH 27.9 08/17/2022 1016   MCHC 32.6 12/27/2022 1013   MCHC 33.1 08/17/2022 1016   RDW 13.6 12/27/2022 1013   RDW 14.3 08/09/2015 1020   Iron Studies No results found for: "IRON", "TIBC", "FERRITIN", "IRONPCTSAT" Lipid Panel     Component Value Date/Time   CHOL 165 12/27/2022 1013   TRIG 150 (H) 12/27/2022 1013   HDL 69  12/27/2022 1013   CHOLHDL 2.5 08/09/2022 0934   CHOLHDL 4 01/22/2020 0906   VLDL 12.4 01/22/2020 0906   LDLCALC 71 12/27/2022 1013   Hepatic Function Panel     Component Value Date/Time   PROT 7.5 12/27/2022 1013   ALBUMIN 4.7 12/27/2022 1013   AST 21 12/27/2022 1013   ALT 23 12/27/2022 1013   ALKPHOS 97 12/27/2022 1013   BILITOT 0.3 12/27/2022 1013      Component Value Date/Time   TSH 1.680 12/27/2022 1013   Nutritional Lab Results  Component Value Date   VD25OH 27.4 (L) 12/27/2022   VD25OH 30.2 08/09/2022   VD25OH 31.1 08/10/2021    Attestations:   Reviewed by clinician on day of visit: allergies, medications, problem list, medical history, surgical history, family history, social history, and previous encounter notes.   Patient was in the office today and time spent on visit including pre-visit chart review and post-visit care/coordination of care and electronic medical record documentation was 50 minutes. 50% of the time was in face to face counseling of this patient's medical condition(s) and providing education on treatment options to include the first-line treatment of diet and lifestyle modification.  I,Special Puri,acting as a Neurosurgeon for [employer], DO.,have documented all relevant documentation on the behalf of Thomasene Lot, DO,as directed by  Thomasene Lot, DO while in the presence of Thomasene Lot, DO.   I, Thomasene Lot, DO, have reviewed all documentation for this visit. The documentation on 01/10/23 for the exam, diagnosis, procedures, and orders are all accurate and complete.

## 2023-01-24 ENCOUNTER — Ambulatory Visit (INDEPENDENT_AMBULATORY_CARE_PROVIDER_SITE_OTHER): Payer: 59 | Admitting: Physician Assistant

## 2023-01-24 ENCOUNTER — Encounter (INDEPENDENT_AMBULATORY_CARE_PROVIDER_SITE_OTHER): Payer: Self-pay | Admitting: Physician Assistant

## 2023-01-24 VITALS — BP 119/77 | HR 80 | Temp 98.2°F | Ht 66.0 in | Wt 242.0 lb

## 2023-01-24 DIAGNOSIS — E1169 Type 2 diabetes mellitus with other specified complication: Secondary | ICD-10-CM

## 2023-01-24 DIAGNOSIS — E538 Deficiency of other specified B group vitamins: Secondary | ICD-10-CM

## 2023-01-24 DIAGNOSIS — Z7984 Long term (current) use of oral hypoglycemic drugs: Secondary | ICD-10-CM

## 2023-01-24 DIAGNOSIS — E559 Vitamin D deficiency, unspecified: Secondary | ICD-10-CM

## 2023-01-24 DIAGNOSIS — Z6839 Body mass index (BMI) 39.0-39.9, adult: Secondary | ICD-10-CM

## 2023-01-24 MED ORDER — VITAMIN D (ERGOCALCIFEROL) 1.25 MG (50000 UNIT) PO CAPS: 50000.0000 [IU] | ORAL_CAPSULE | ORAL | 0 refills | Status: DC

## 2023-01-24 MED ORDER — CYANOCOBALAMIN 500 MCG PO TABS: 500.0000 ug | ORAL_TABLET | Freq: Every day | ORAL | Status: DC

## 2023-01-24 NOTE — Assessment & Plan Note (Signed)
Type 2 Diabetes Mellitus with other specified complication, without long-term current use of insulin HgbA1c is not at goal. Last A1c was 7.3 CBGs: Not checking Episodes of hypoglycemia: no Medication(s):  Metformin 250 mg twice daily.  Started last visit. No side effects with metformin.  She is working on Engineer, technical sales to decrease simple carbohydrates, increase lean proteins and exercise to promote weight loss, improve glycemic control and prevent progression to Type 2 diabetes.    Lab Results  Component Value Date   HGBA1C 7.3 (H) 12/27/2022   HGBA1C 7.1 (H) 08/09/2022   HGBA1C 6.3 (H) 02/06/2022   Lab Results  Component Value Date   LDLCALC 71 12/27/2022   CREATININE 0.78 12/27/2022   Lab Results  Component Value Date   GFR 90.84 01/22/2020   GFR 89.50 07/16/2019   GFR 95.73 12/19/2017    Plan: Continue  metformin 250 mg twice daily. Reports no refills needed today.  Continue working on nutrition plan to decrease simple carbohydrates, increase lean proteins and exercise to promote weight loss,and improve glycemic control. Recheck labs in 2-3 months.

## 2023-01-24 NOTE — Assessment & Plan Note (Signed)
Vitamin D Deficiency Vitamin D is not at goal of 50.  Most recent vitamin D level was 27.4. Endorses fatigue. She is on  prescription ergocalciferol 50,000 IU weekly. No N/V or muscle weakness or other side effects with Ergocalciferol.  Lab Results  Component Value Date   VD25OH 27.4 (L) 12/27/2022   VD25OH 30.2 08/09/2022   VD25OH 31.1 08/10/2021    Plan: Continue and refill  prescription ergocalciferol 50,000 IU weekly Low vitamin D levels can be associated with adiposity and may result in leptin resistance and weight gain. Also associated with fatigue. Currently on vitamin D supplementation without any adverse effects.  Recheck Vitamin D level in 2-3 months.

## 2023-01-24 NOTE — Progress Notes (Signed)
Office: [phone]  /  Fax: [phone]  WEIGHT SUMMARY AND BIOMETRICS  Vitals Temp: 98.2 F (36.8 C) BP: 119/77 Pulse Rate: 80 SpO2: 91 %   Anthropometric Measurements Height: 5\' 6"  (1.676 m) Weight: 242 lb (109.8 kg) BMI (Calculated): 39.08 Weight at Last Visit: 243 lb Weight Lost Since Last Visit: 1 lb Weight Gained Since Last Visit: 0 Starting Weight: 252 lb Total Weight Loss (lbs): 10 lb (4.536 kg)   Body Composition  Body Fat %: 42.5 % Fat Mass (lbs): 103 lbs Muscle Mass (lbs): 132.4 lbs Total Body Water (lbs): 85.8 lbs Visceral Fat Rating : 13   Other Clinical Data Fasting: yes Labs: no Today's Visit #: 3 Starting Date: 12/27/22 Comments: returning patient     HPI  Chief Complaint: OBESITY  Maria Glenn is here to discuss her progress with her obesity treatment plan. She is on the the Category 2 Plan and keeping a food journal and adhering to recommended goals of 1300-1400 calories and 100+ grams of protein and states she is following her eating plan approximately 75 % of the time. She states she is exercising 0 minutes 0 times per week.   Interval History:  Since last office visit she is down 1 lb. Hunger/appetite- decreased at times. Skips lunch at times. Struggled with journaling and plans to improve.  Drinks premier protein shake in coffee- 1/2 at breakfast and 1/2 after dinner. Sleep- ? Menopausal hot flashes, trying black cohosh , not always restorative. Hx of Factor V Leiden with DVT hx and not at candidate for HRT.  Exercise- housework, has plans to start walking on treadmill for 30 mins daily.  Hydration- does well, using sugar free additives to water.  Pharmacotherapy: metformin 250 mg twice daily for Type 2 diabetes. No side effects with metformin.   PHYSICAL EXAM:  Blood pressure 119/77, pulse 80, temperature 98.2 F (36.8 C), height 5\' 6"  (1.676 m), weight 242 lb (109.8 kg), SpO2 91 %. Body mass index is 39.06 kg/m.  General: She is  overweight, cooperative, alert, well developed, and in no acute distress. PSYCH: Has normal mood, affect and thought process.   Cardiovascular: HR 80's regular Lungs: Normal breathing effort, no conversational dyspnea. Neuro: no focal deficits  DIAGNOSTIC DATA REVIEWED:  BMET    Component Value Date/Time   NA 142 12/27/2022 1013   K 4.5 12/27/2022 1013   CL 102 12/27/2022 1013   CO2 23 12/27/2022 1013   GLUCOSE 122 (H) 12/27/2022 1013   GLUCOSE 155 (H) 08/17/2022 1016   BUN 16 12/27/2022 1013   CREATININE 0.78 12/27/2022 1013   CALCIUM 9.9 12/27/2022 1013   GFRNONAA >60 08/17/2022 1016   GFRAA 113 03/23/2020 1439   Lab Results  Component Value Date   HGBA1C 7.3 (H) 12/27/2022   HGBA1C 6.2 11/30/2016   Lab Results  Component Value Date   INSULIN 15.9 12/27/2022   INSULIN 8.7 03/23/2020   Lab Results  Component Value Date   TSH 1.680 12/27/2022   CBC    Component Value Date/Time   WBC 7.0 12/27/2022 1013   WBC 6.8 08/17/2022 1016   RBC 4.91 12/27/2022 1013   RBC 4.59 08/17/2022 1016   HGB 14.0 12/27/2022 1013   HGB 12.5 08/09/2015 1020   HCT 43.0 12/27/2022 1013   HCT 37.8 08/09/2015 1020   PLT 238 12/27/2022 1013   MCV 88 12/27/2022 1013   MCV 86 08/09/2015 1020   MCH 28.5 12/27/2022 1013   MCH 27.9 08/17/2022 1016  MCHC 32.6 12/27/2022 1013   MCHC 33.1 08/17/2022 1016   RDW 13.6 12/27/2022 1013   RDW 14.3 08/09/2015 1020   Iron Studies No results found for: "IRON", "TIBC", "FERRITIN", "IRONPCTSAT" Lipid Panel     Component Value Date/Time   CHOL 165 12/27/2022 1013   TRIG 150 (H) 12/27/2022 1013   HDL 69 12/27/2022 1013   CHOLHDL 2.5 08/09/2022 0934   CHOLHDL 4 01/22/2020 0906   VLDL 12.4 01/22/2020 0906   LDLCALC 71 12/27/2022 1013   Hepatic Function Panel     Component Value Date/Time   PROT 7.5 12/27/2022 1013   ALBUMIN 4.7 12/27/2022 1013   AST 21 12/27/2022 1013   ALT 23 12/27/2022 1013   ALKPHOS 97 12/27/2022 1013   BILITOT 0.3  12/27/2022 1013      Component Value Date/Time   TSH 1.680 12/27/2022 1013   Nutritional Lab Results  Component Value Date   VD25OH 27.4 (L) 12/27/2022   VD25OH 30.2 08/09/2022   VD25OH 31.1 08/10/2021    ASSOCIATED CONDITIONS ADDRESSED TODAY  ASSESSMENT AND PLAN  Problem List Items Addressed This Visit     Vitamin D deficiency    Vitamin D Deficiency Vitamin D is not at goal of 50.  Most recent vitamin D level was 27.4. Endorses fatigue. She is on  prescription ergocalciferol 50,000 IU weekly. No N/V or muscle weakness or other side effects with Ergocalciferol.  Lab Results  Component Value Date   VD25OH 27.4 (L) 12/27/2022   VD25OH 30.2 08/09/2022   VD25OH 31.1 08/10/2021   Plan: Continue and refill  prescription ergocalciferol 50,000 IU weekly Low vitamin D levels can be associated with adiposity and may result in leptin resistance and weight gain. Also associated with fatigue. Currently on vitamin D supplementation without any adverse effects.  Recheck Vitamin D level in 2-3 months.        Relevant Medications   Vitamin D, Ergocalciferol, (DRISDOL) 1.25 MG (50000 UNIT) CAPS capsule   B12 deficiency    B 12 level 271. Taking B 12 supplement and we discussed sublingual B 12 may be better absorbed. Endorses fatigue.   Plan: Continue B 12 supplement 500 mcg SL daily .  Recheck B 12 in 2-3 months.       Relevant Medications   cyanocobalamin (VITAMIN B12) 500 MCG tablet   Morbid obesity-start bmi 40.7/date4/3/24   Type 2 diabetes mellitus with obesity (HCC) - Primary    Type 2 Diabetes Mellitus with other specified complication, without long-term current use of insulin HgbA1c is not at goal. Last A1c was 7.3 CBGs: Not checking Episodes of hypoglycemia: no Medication(s):  Metformin 250 mg twice daily.  Started last visit. No side effects with metformin.  She is working on Engineer, technical sales to decrease simple carbohydrates, increase lean proteins and exercise to  promote weight loss, improve glycemic control and prevent progression to Type 2 diabetes.    Lab Results  Component Value Date   HGBA1C 7.3 (H) 12/27/2022   HGBA1C 7.1 (H) 08/09/2022   HGBA1C 6.3 (H) 02/06/2022   Lab Results  Component Value Date   LDLCALC 71 12/27/2022   CREATININE 0.78 12/27/2022   Lab Results  Component Value Date   GFR 90.84 01/22/2020   GFR 89.50 07/16/2019   GFR 95.73 12/19/2017   Plan: Continue  metformin 250 mg twice daily. Reports no refills needed today.  Continue working on nutrition plan to decrease simple carbohydrates, increase lean proteins and exercise to promote weight loss,and improve  glycemic control. Recheck labs in 2-3 months.        Other Visit Diagnoses     BMI 39.0-39.9,adult Current BMI 39.1          Current BMI 39.1 Down 10 lbs since 12/27/2022 TBW loss 3.97% since 12/27/2022 Goals of improving journaling and focusing on getting adequate protein.   TREATMENT PLAN FOR OBESITY:  Recommended Dietary Goals  Kamera is currently in the action stage of change. As such, her goal is to continue weight management plan. She has agreed to the Category 2 Plan and keeping a food journal and adhering to recommended goals of 1300-1400 calories and 100+ grams of protein.  Behavioral Intervention  We discussed the following Behavioral Modification Strategies today: increasing lean protein intake, decreasing simple carbohydrates , increasing vegetables, increasing water intake, continue to practice mindfulness when eating, and planning for success.  Additional resources provided today: NA  Recommended Physical Activity Goals  Odena has been advised to work up to 150 minutes of moderate intensity aerobic activity a week and strengthening exercises 2-3 times per week for cardiovascular health, weight loss maintenance and preservation of muscle mass.   She has agreed to Continue current level of physical activity    Pharmacotherapy We  discussed various medication options to help Zakia with her weight loss efforts and we both agreed to metformin 250 mg twice daily for Type 2 diabetes and continue to work on nutritional and behavioral strategies to promote weight loss.    Return in about 2 weeks (around 02/07/2023).Marland Kitchen She was informed of the importance of frequent follow up visits to maximize her success with intensive lifestyle modifications for her multiple health conditions.   ATTESTASTION STATEMENTS:  Reviewed by clinician on day of visit: allergies, medications, problem list, medical history, surgical history, family history, social history, and previous encounter notes.   I have personally spent 42 minutes total time today in preparation, patient care, nutritional counseling and documentation for this visit, including the following: review of clinical lab tests; review of medical tests/procedures/services.      Marchello Rothgeb, PA-C

## 2023-01-24 NOTE — Assessment & Plan Note (Signed)
B 12 level 271. Taking B 12 supplement and we discussed sublingual B 12 may be better absorbed. Endorses fatigue.   Plan: Continue B 12 supplement 500 mcg SL daily .  Recheck B 12 in 2-3 months.

## 2023-01-29 ENCOUNTER — Other Ambulatory Visit (HOSPITAL_BASED_OUTPATIENT_CLINIC_OR_DEPARTMENT_OTHER): Payer: Self-pay

## 2023-02-02 ENCOUNTER — Other Ambulatory Visit (HOSPITAL_COMMUNITY): Payer: Self-pay

## 2023-02-07 ENCOUNTER — Ambulatory Visit (HOSPITAL_BASED_OUTPATIENT_CLINIC_OR_DEPARTMENT_OTHER): Payer: 59 | Admitting: Family Medicine

## 2023-02-08 ENCOUNTER — Ambulatory Visit (INDEPENDENT_AMBULATORY_CARE_PROVIDER_SITE_OTHER): Payer: 59 | Admitting: Family Medicine

## 2023-02-08 ENCOUNTER — Encounter (INDEPENDENT_AMBULATORY_CARE_PROVIDER_SITE_OTHER): Payer: Self-pay | Admitting: Family Medicine

## 2023-02-08 VITALS — BP 129/79 | HR 73 | Temp 98.0°F | Ht 66.0 in | Wt 238.0 lb

## 2023-02-08 DIAGNOSIS — Z6838 Body mass index (BMI) 38.0-38.9, adult: Secondary | ICD-10-CM | POA: Diagnosis not present

## 2023-02-08 DIAGNOSIS — E559 Vitamin D deficiency, unspecified: Secondary | ICD-10-CM

## 2023-02-08 DIAGNOSIS — E1169 Type 2 diabetes mellitus with other specified complication: Secondary | ICD-10-CM

## 2023-02-08 DIAGNOSIS — Z6839 Body mass index (BMI) 39.0-39.9, adult: Secondary | ICD-10-CM

## 2023-02-08 DIAGNOSIS — Z7984 Long term (current) use of oral hypoglycemic drugs: Secondary | ICD-10-CM

## 2023-02-08 MED ORDER — METFORMIN HCL 500 MG PO TABS: ORAL_TABLET | ORAL | 0 refills | Status: DC

## 2023-02-08 MED ORDER — VITAMIN D (ERGOCALCIFEROL) 1.25 MG (50000 UNIT) PO CAPS: 50000.0000 [IU] | ORAL_CAPSULE | ORAL | 0 refills | Status: DC

## 2023-02-08 NOTE — Progress Notes (Signed)
[name], D.O.  ABFM, ABOM Specializing in Clinical Bariatric Medicine  Office located at: [address] [place], [place]  [address]     Assessment and Plan:   No orders of the defined types were placed in this encounter.   Medications Discontinued During This Encounter  Medication Reason   Vitamin D, Ergocalciferol, (DRISDOL) 1.25 MG (50000 UNIT) CAPS capsule Reorder   metFORMIN (GLUCOPHAGE) 500 MG tablet Reorder     Meds ordered this encounter  Medications   Vitamin D, Ergocalciferol, (DRISDOL) 1.25 MG (50000 UNIT) CAPS capsule    Sig: Take 1 capsule (50,000 Units total) by mouth every 7 (seven) days.    Dispense:  4 capsule    Refill:  0   metFORMIN (GLUCOPHAGE) 500 MG tablet    Sig: 1/2 po with lunch and dinner daily    Dispense:  30 tablet    Refill:  0    30 d supply;  ** OV for RF **   Do not send RF request     Type 2 diabetes mellitus with obesity (HCC) Assessment: Condition is not optimized.  Lab Results  Component Value Date   HGBA1C 7.3 (H) 12/27/2022   HGBA1C 7.1 (H) 08/09/2022   HGBA1C 6.3 (H) 02/06/2022   INSULIN 15.9 12/27/2022   INSULIN 8.3 03/14/2021   INSULIN 13.0 12/07/2020  - She reports good compliance and tolerance with Metformin 500 mg 1/2 po with lunch and dinner daily. Denies any loose stools and GI upset.  - Endorses that her hunger and cravings are controlled.   Plan: - Continue with med. Will refill this today.   -Continue her prudent nutritional plan that is low in simple carbohydrates, saturated fats and trans fats to goal of 5-10% weight loss to achieve significant health benefits.  Pt encouraged to continually advance exercise and cardiovascular fitness as tolerated throughout weight loss journey.    Vitamin D deficiency Assessment: Condition is not optimized.  Lab Results  Component Value Date   VD25OH 27.4 (L) 12/27/2022   VD25OH 30.2 08/09/2022   VD25OH 31.1 08/10/2021  - Reports good compliance and  tolerance with Ergocalciferol 50K IU weekly. Denies any side effects.  Plan: - Continue with med. Will refill this today.   -Will continue to  monitor levels regularly (every 3-4 mo on average) to keep levels within normal limits and prevent over supplementation.   TREATMENT PLAN FOR OBESITY: BMI 39.0-39.9,adult Current BMI 38.43 Morbid obesity-start bmi 40.7/date 12/27/22 Assessment:  Maria Glenn is here to discuss her progress with her obesity treatment plan along with follow-up of her obesity related diagnoses. See Medical Weight Management Flowsheet for complete bioelectrical impedance results.  Condition is improving, but not optimized. Biometric data collected today, was reviewed with patient.   Since last office visit on 01/24/23 patient's Fat mass has decreased by 3.2 lb. Muscle mass has decreased by 1.2 lb. Total body water has decreased by 1.4 lb.  Counseling done on how various foods will affect these numbers and how to maximize success  Total lbs lost to date: 14 Total weight loss percentage to date: 5.56  Plan:  Jerisha is currently in the action stage of change. As such, her goal is to continue her weight management plan. Sadina will work on healthier eating habits and continue the Category 2 Plan and keeping a food journal and adhering to recommended goals of 1300-1400 calories and 90+ protein  Behavioral Intervention Additional resources provided today: patient declined Evidence-based interventions  for health behavior change were utilized today including the discussion of self monitoring techniques, problem-solving barriers and SMART goal setting techniques.   Regarding patient's less desirable eating habits and patterns, we employed the technique of small changes.  Pt will specifically work on: continue adherence to prudent nutritional plan for next visit.    Recommended Physical Activity Goals  Angelmarie has been advised to slowly work up to 150 minutes of moderate  intensity aerobic activity a week and strengthening exercises 2-3 times per week for cardiovascular health, weight loss maintenance and preservation of muscle mass.   She has agreed to Continue current level of physical activity   FOLLOW UP: Return in about 2 weeks (around 02/22/2023). She was informed of the importance of frequent follow up visits to maximize her success with intensive lifestyle modifications for her multiple health conditions.  Subjective:   Chief complaint: Obesity Maria Glenn is here to discuss her progress with her obesity treatment plan. She is on the Category 2 Plan and keeping a food journal and adhering to recommended goals of 1300-1400 calories and 100+ protein and states she is following her eating plan approximately 90% of the time. She states she is not exercising.   Interval History:  Maria Glenn is here for a follow up office visit.  Since last office visit:   - She endorses that she skips lunch and has a late breakfast.  - She states the Metformin has been helping with her hunger/cravings.  - She has been on Ozempic in the past but it was discontinued due to ineffectiveness.   Pharmacotherapy for weight loss: She is currently taking  Metformin  for medical weight loss.  Denies side effects.    Review of Systems:  Pertinent positives were addressed with patient today.  Weight Summary and Biometrics   Weight Lost Since Last Visit: 4 lb  Weight Gained Since Last Visit: 0    Vitals Temp: 98 F (36.7 C) BP: 129/79 Pulse Rate: 73 SpO2: 97 %   Anthropometric Measurements Height: 5\' 6"  (1.676 m) Weight: 238 lb (108 kg) BMI (Calculated): 38.43 Weight at Last Visit: 242 lb Weight Lost Since Last Visit: 4 lb Weight Gained Since Last Visit: 0 Starting Weight: 252 lb Total Weight Loss (lbs): 14 lb (6.35 kg) Peak Weight: 252 lb   Body Composition  Body Fat %: 41.9 % Fat Mass (lbs): 99.8 lbs Muscle Mass (lbs): 131.2 lbs Total Body Water (lbs):  84.4 lbs Visceral Fat Rating : 12   Other Clinical Data Fasting: No Labs: No Today's Visit #: 4 Starting Date: 12/27/22   Objective:   PHYSICAL EXAM: Blood pressure 129/79, pulse 73, temperature 98 F (36.7 C), height 5\' 6"  (1.676 m), weight 238 lb (108 kg), SpO2 97 %. Body mass index is 38.41 kg/m.  General: Well Developed, well nourished, and in no acute distress.  HEENT: Normocephalic, atraumatic Skin: Warm and dry, cap RF less 2 sec, good turgor Chest:  Normal excursion, shape, no gross abn Respiratory: speaking in full sentences, no conversational dyspnea NeuroM-Sk: Ambulates w/o assistance, moves * 4 Psych: A and O *3, insight good, mood-full  DIAGNOSTIC DATA REVIEWED:  BMET    Component Value Date/Time   NA 142 12/27/2022 1013   K 4.5 12/27/2022 1013   CL 102 12/27/2022 1013   CO2 23 12/27/2022 1013   GLUCOSE 122 (H) 12/27/2022 1013   GLUCOSE 155 (H) 08/17/2022 1016   BUN 16 12/27/2022 1013   CREATININE 0.78 12/27/2022 1013  CALCIUM 9.9 12/27/2022 1013   GFRNONAA >60 08/17/2022 1016   GFRAA 113 03/23/2020 1439   Lab Results  Component Value Date   HGBA1C 7.3 (H) 12/27/2022   HGBA1C 6.2 11/30/2016   Lab Results  Component Value Date   INSULIN 15.9 12/27/2022   INSULIN 8.7 03/23/2020   Lab Results  Component Value Date   TSH 1.680 12/27/2022   CBC    Component Value Date/Time   WBC 7.0 12/27/2022 1013   WBC 6.8 08/17/2022 1016   RBC 4.91 12/27/2022 1013   RBC 4.59 08/17/2022 1016   HGB 14.0 12/27/2022 1013   HGB 12.5 08/09/2015 1020   HCT 43.0 12/27/2022 1013   HCT 37.8 08/09/2015 1020   PLT 238 12/27/2022 1013   MCV 88 12/27/2022 1013   MCV 86 08/09/2015 1020   MCH 28.5 12/27/2022 1013   MCH 27.9 08/17/2022 1016   MCHC 32.6 12/27/2022 1013   MCHC 33.1 08/17/2022 1016   RDW 13.6 12/27/2022 1013   RDW 14.3 08/09/2015 1020   Iron Studies No results found for: "IRON", "TIBC", "FERRITIN", "IRONPCTSAT" Lipid Panel     Component Value  Date/Time   CHOL 165 12/27/2022 1013   TRIG 150 (H) 12/27/2022 1013   HDL 69 12/27/2022 1013   CHOLHDL 2.5 08/09/2022 0934   CHOLHDL 4 01/22/2020 0906   VLDL 12.4 01/22/2020 0906   LDLCALC 71 12/27/2022 1013   Hepatic Function Panel     Component Value Date/Time   PROT 7.5 12/27/2022 1013   ALBUMIN 4.7 12/27/2022 1013   AST 21 12/27/2022 1013   ALT 23 12/27/2022 1013   ALKPHOS 97 12/27/2022 1013   BILITOT 0.3 12/27/2022 1013      Component Value Date/Time   TSH 1.680 12/27/2022 1013   Nutritional Lab Results  Component Value Date   VD25OH 27.4 (L) 12/27/2022   VD25OH 30.2 08/09/2022   VD25OH 31.1 08/10/2021    Attestations:   Reviewed by clinician on day of visit: allergies, medications, problem list, medical history, surgical history, family history, social history, and previous encounter notes.  I,Special Puri,acting as a Neurosurgeon for [employer], DO.,have documented all relevant documentation on the behalf of Thomasene Lot, DO,as directed by  Thomasene Lot, DO while in the presence of Thomasene Lot, DO.   I, Thomasene Lot, DO, have reviewed all documentation for this visit. The documentation on 02/08/23 for the exam, diagnosis, procedures, and orders are all accurate and complete.

## 2023-02-22 ENCOUNTER — Other Ambulatory Visit (HOSPITAL_BASED_OUTPATIENT_CLINIC_OR_DEPARTMENT_OTHER): Payer: Self-pay

## 2023-02-22 ENCOUNTER — Ambulatory Visit (INDEPENDENT_AMBULATORY_CARE_PROVIDER_SITE_OTHER): Payer: 59 | Admitting: Family Medicine

## 2023-02-22 ENCOUNTER — Encounter (INDEPENDENT_AMBULATORY_CARE_PROVIDER_SITE_OTHER): Payer: Self-pay | Admitting: Family Medicine

## 2023-02-22 VITALS — BP 129/78 | HR 74 | Temp 97.9°F | Ht 66.0 in | Wt 235.0 lb

## 2023-02-22 DIAGNOSIS — Z6837 Body mass index (BMI) 37.0-37.9, adult: Secondary | ICD-10-CM | POA: Diagnosis not present

## 2023-02-22 DIAGNOSIS — Z7984 Long term (current) use of oral hypoglycemic drugs: Secondary | ICD-10-CM

## 2023-02-22 DIAGNOSIS — E1169 Type 2 diabetes mellitus with other specified complication: Secondary | ICD-10-CM

## 2023-02-22 DIAGNOSIS — Z6839 Body mass index (BMI) 39.0-39.9, adult: Secondary | ICD-10-CM

## 2023-02-22 DIAGNOSIS — E559 Vitamin D deficiency, unspecified: Secondary | ICD-10-CM

## 2023-02-22 MED ORDER — METFORMIN HCL 500 MG PO TABS: ORAL_TABLET | ORAL | 0 refills | Status: DC

## 2023-02-22 MED ORDER — BLOOD GLUCOSE TEST VI STRP
1.0000 | ORAL_STRIP | Freq: Three times a day (TID) | 0 refills | Status: AC
  Filled 2023-02-22 – 2023-02-23 (×2): qty 100, 30d supply, fill #0

## 2023-02-22 MED ORDER — ACCU-CHEK FASTCLIX LANCET KIT
1.0000 | PACK | Freq: Three times a day (TID) | 0 refills | Status: AC
  Filled 2023-02-22 – 2023-02-23 (×2): qty 1, 30d supply, fill #0

## 2023-02-22 MED ORDER — BLOOD GLUCOSE MONITOR SYSTEM W/DEVICE KIT
1.0000 | PACK | Freq: Three times a day (TID) | 0 refills | Status: AC
  Filled 2023-02-22 – 2023-02-23 (×2): qty 1, 30d supply, fill #0

## 2023-02-22 MED ORDER — ACCU-CHEK SOFTCLIX LANCETS MISC
1.0000 | Freq: Three times a day (TID) | 0 refills | Status: AC
  Filled 2023-02-22: qty 102, 30d supply, fill #0
  Filled 2023-02-23: qty 100, 30d supply, fill #0
  Filled 2023-02-23: qty 102, 30d supply, fill #0

## 2023-02-22 MED ORDER — VITAMIN D (ERGOCALCIFEROL) 1.25 MG (50000 UNIT) PO CAPS
50000.0000 [IU] | ORAL_CAPSULE | ORAL | 0 refills | Status: DC
  Filled 2023-02-22: qty 4, 28d supply, fill #0

## 2023-02-22 NOTE — Progress Notes (Signed)
Maria Glenn, D.O.  ABFM, ABOM Specializing in Clinical Bariatric Medicine  Office located at: [address] [place], [place]  [address]     Assessment and Plan:   Medications Discontinued During This Encounter  Medication Reason   Vitamin D, Ergocalciferol, (DRISDOL) 1.25 MG (50000 UNIT) CAPS capsule Reorder   metFORMIN (GLUCOPHAGE) 500 MG tablet Reorder     Meds ordered this encounter  Medications   metFORMIN (GLUCOPHAGE) 500 MG tablet    Sig: 1 po with lunch and dinner daily    Dispense:  60 tablet    Refill:  0    30 d supply;  ** OV for RF **   Do not send RF request   Vitamin D, Ergocalciferol, (DRISDOL) 1.25 MG (50000 UNIT) CAPS capsule    Sig: Take 1 capsule (50,000 Units total) by mouth every 7 (seven) days.    Dispense:  4 capsule    Refill:  0     Type 2 diabetes mellitus with obesity (HCC) Assessment: Condition is not optimized.  Lab Results  Component Value Date   HGBA1C 7.3 (H) 12/27/2022   HGBA1C 7.1 (H) 08/09/2022   HGBA1C 6.3 (H) 02/06/2022   INSULIN 15.9 12/27/2022   INSULIN 8.3 03/14/2021   INSULIN 13.0 12/07/2020  - No issues with Metformin 500 mg 1/2 po with lunch and dinner daily . Denies any adverse effects. - She endorses that she sometimes has hunger and cravings during the night.  - She does not check her sugars at home and denies any symptoms of low/high sugars.   Plan: - Increase Metformin to 500 mg 1 po with lunch and dinner daily. Risks/benefits of increasing Metformin dose were reviewed with pt. All questions were answered appropriately.  - Reminded pt if she feels poorly- check Blood Sugar at that time.    Maria Glenn will continue to work on weight loss, exercise, via their meal plan we devised to help decrease the risk of progressing to diabetes.    Vitamin D deficiency Assessment: Condition is not optimized. Lab Results  Component Value Date   VD25OH 27.4 (L) 12/27/2022   VD25OH 30.2 08/09/2022   VD25OH 31.1 08/10/2021   - No issues with Ergocalciferol 50K IU weekly. Denies any side effects.  Plan: - Continue with med. Will refill this today.   - Will continue to monitor levels regularly (every 3-4 mo on average) to keep levels within normal limits and prevent over supplementation.   TREATMENT PLAN FOR OBESITY: BMI 39.0-39.9,adult Current BMI 37.95 Morbid obesity-start bmi 40.7/date 12/27/22 Assessment:  Maria Glenn is here to discuss her progress with her obesity treatment plan along with follow-up of her obesity related diagnoses. See Medical Weight Management Flowsheet for complete bioelectrical impedance results.  Condition is not optimized. Biometric data collected today, was reviewed with patient.   Since last office visit on 02/08/23 patient's  Muscle mass has decreased by 3.8 lb. Fat mass has increased by 1 lb. Total body water has decreased by 0.6 lb.  Counseling done on how various foods will affect these numbers and how to maximize success  Total lbs lost to date: 17 Total weight loss percentage to date: 6.75   Plan:  - Continue the Category 2 Plan and keeping a food journal and adhering to recommended goals of 1300-1400 calories and 90+ protein   Behavioral Intervention Additional resources provided today: patient declined Evidence-based interventions for health behavior change were utilized today including the discussion of self monitoring  techniques, problem-solving barriers and SMART goal setting techniques.   Regarding patient's less desirable eating habits and patterns, we employed the technique of small changes.  Pt will specifically work on: continue adherence to prudent nutritional plan for next visit.    Recommended Physical Activity Goals  Maria Glenn has been advised to slowly work up to 150 minutes of moderate intensity aerobic activity a week and strengthening exercises 2-3 times per week for cardiovascular health, weight loss maintenance and preservation of muscle mass.   She  has agreed to Think about ways to increase daily physical activity and overcoming barriers to exercise   FOLLOW UP: Return in about 2 weeks (around 03/08/2023). She was informed of the importance of frequent follow up visits to maximize her success with intensive lifestyle modifications for her multiple health conditions.   Subjective:   Chief complaint: Obesity Maria Glenn is here to discuss her progress with her obesity treatment plan. She is on the the Category 2 Plan and keeping a food journal and adhering to recommended goals of 1300-1400 calories and 90+ protein and states she is following her eating plan approximately 50% of the time. She states she is not exercising.   Interval History:  Maria Glenn is here for a follow up office visit.     Since last office visit:   - States that she has been journaling "on and off"; on the days she journals she endorses hitting her daily caloric and protein goals.  - The other 50% of the time, she endorses eating off plan foods that are higher in carbs and lower in protein.   Pharmacotherapy for weight loss: She is currently taking  Metformin  for medical weight loss.  Denies side effects.    Review of Systems:  Pertinent positives were addressed with patient today.  Weight Summary and Biometrics   Weight Lost Since Last Visit: 3 lb0  No data recorded   Vitals Temp: 97.9 F (36.6 C) BP: 129/78 Pulse Rate: 74 SpO2: 97 %   Anthropometric Measurements Height: 5\' 6"  (1.676 m) Weight: 235 lb (106.6 kg) BMI (Calculated): 37.95 Weight at Last Visit: 238 lb Weight Lost Since Last Visit: 3 lb0 Starting Weight: 252 lb Peak Weight: 252lb   Body Composition  Body Fat %: 42.9 % Fat Mass (lbs): 100.8 lbs Muscle Mass (lbs): 127.4 lbs Total Body Water (lbs): 83 lbs Visceral Fat Rating : 13   Other Clinical Data Fasting: No Labs: No Today's Visit #: 5 Starting Date: 12/27/22   Objective:   PHYSICAL EXAM: Blood pressure 129/78,  pulse 74, temperature 97.9 F (36.6 C), height 5\' 6"  (1.676 m), weight 235 lb (106.6 kg), SpO2 97 %. Body mass index is 37.93 kg/m.  General: Well Developed, well nourished, and in no acute distress.  HEENT: Normocephalic, atraumatic Skin: Warm and dry, cap RF less 2 sec, good turgor Chest:  Normal excursion, shape, no gross abn Respiratory: speaking in full sentences, no conversational dyspnea NeuroM-Sk: Ambulates w/o assistance, moves * 4 Psych: A and O *3, insight good, mood-full  DIAGNOSTIC DATA REVIEWED:  BMET    Component Value Date/Time   NA 142 12/27/2022 1013   K 4.5 12/27/2022 1013   CL 102 12/27/2022 1013   CO2 23 12/27/2022 1013   GLUCOSE 122 (H) 12/27/2022 1013   GLUCOSE 155 (H) 08/17/2022 1016   BUN 16 12/27/2022 1013   CREATININE 0.78 12/27/2022 1013   CALCIUM 9.9 12/27/2022 1013   GFRNONAA >60 08/17/2022 1016   GFRAA 113  03/23/2020 1439   Lab Results  Component Value Date   HGBA1C 7.3 (H) 12/27/2022   HGBA1C 6.2 11/30/2016   Lab Results  Component Value Date   INSULIN 15.9 12/27/2022   INSULIN 8.7 03/23/2020   Lab Results  Component Value Date   TSH 1.680 12/27/2022   CBC    Component Value Date/Time   WBC 7.0 12/27/2022 1013   WBC 6.8 08/17/2022 1016   RBC 4.91 12/27/2022 1013   RBC 4.59 08/17/2022 1016   HGB 14.0 12/27/2022 1013   HGB 12.5 08/09/2015 1020   HCT 43.0 12/27/2022 1013   HCT 37.8 08/09/2015 1020   PLT 238 12/27/2022 1013   MCV 88 12/27/2022 1013   MCV 86 08/09/2015 1020   MCH 28.5 12/27/2022 1013   MCH 27.9 08/17/2022 1016   MCHC 32.6 12/27/2022 1013   MCHC 33.1 08/17/2022 1016   RDW 13.6 12/27/2022 1013   RDW 14.3 08/09/2015 1020   Iron Studies No results found for: "IRON", "TIBC", "FERRITIN", "IRONPCTSAT" Lipid Panel     Component Value Date/Time   CHOL 165 12/27/2022 1013   TRIG 150 (H) 12/27/2022 1013   HDL 69 12/27/2022 1013   CHOLHDL 2.5 08/09/2022 0934   CHOLHDL 4 01/22/2020 0906   VLDL 12.4 01/22/2020  0906   LDLCALC 71 12/27/2022 1013   Hepatic Function Panel     Component Value Date/Time   PROT 7.5 12/27/2022 1013   ALBUMIN 4.7 12/27/2022 1013   AST 21 12/27/2022 1013   ALT 23 12/27/2022 1013   ALKPHOS 97 12/27/2022 1013   BILITOT 0.3 12/27/2022 1013      Component Value Date/Time   TSH 1.680 12/27/2022 1013   Nutritional Lab Results  Component Value Date   VD25OH 27.4 (L) 12/27/2022   VD25OH 30.2 08/09/2022   VD25OH 31.1 08/10/2021    Attestations:   Reviewed by clinician on day of visit: allergies, medications, problem list, medical history, surgical history, family history, social history, and previous encounter notes.    I,Special Puri,acting as a Neurosurgeon for [employer], DO.,have documented all relevant documentation on the behalf of Thomasene Lot, DO,as directed by  Thomasene Lot, DO while in the presence of Thomasene Lot, DO.   I, Thomasene Lot, DO, have reviewed all documentation for this visit. The documentation on 02/22/23 for the exam, diagnosis, procedures, and orders are all accurate and complete.

## 2023-02-23 ENCOUNTER — Other Ambulatory Visit (HOSPITAL_BASED_OUTPATIENT_CLINIC_OR_DEPARTMENT_OTHER): Payer: Self-pay

## 2023-02-23 ENCOUNTER — Other Ambulatory Visit: Payer: Self-pay

## 2023-03-08 ENCOUNTER — Encounter (INDEPENDENT_AMBULATORY_CARE_PROVIDER_SITE_OTHER): Payer: Self-pay | Admitting: Family Medicine

## 2023-03-08 ENCOUNTER — Ambulatory Visit (INDEPENDENT_AMBULATORY_CARE_PROVIDER_SITE_OTHER): Payer: 59 | Admitting: Family Medicine

## 2023-03-08 VITALS — BP 122/83 | HR 72 | Temp 97.8°F | Ht 66.0 in | Wt 235.0 lb

## 2023-03-08 DIAGNOSIS — Z6837 Body mass index (BMI) 37.0-37.9, adult: Secondary | ICD-10-CM | POA: Diagnosis not present

## 2023-03-08 DIAGNOSIS — E559 Vitamin D deficiency, unspecified: Secondary | ICD-10-CM

## 2023-03-08 DIAGNOSIS — Z7985 Long-term (current) use of injectable non-insulin antidiabetic drugs: Secondary | ICD-10-CM

## 2023-03-08 DIAGNOSIS — E1169 Type 2 diabetes mellitus with other specified complication: Secondary | ICD-10-CM

## 2023-03-08 DIAGNOSIS — R638 Other symptoms and signs concerning food and fluid intake: Secondary | ICD-10-CM

## 2023-03-08 DIAGNOSIS — Z6839 Body mass index (BMI) 39.0-39.9, adult: Secondary | ICD-10-CM

## 2023-03-08 MED ORDER — VITAMIN D (ERGOCALCIFEROL) 1.25 MG (50000 UNIT) PO CAPS: 50000.0000 [IU] | ORAL_CAPSULE | ORAL | 0 refills | Status: DC

## 2023-03-08 MED ORDER — TOPIRAMATE 50 MG PO TABS: ORAL_TABLET | ORAL | 0 refills | Status: DC

## 2023-03-08 MED ORDER — METFORMIN HCL 500 MG PO TABS: ORAL_TABLET | ORAL | 0 refills | Status: DC

## 2023-03-08 NOTE — Progress Notes (Signed)
[name], D.O.  ABFM, ABOM Specializing in Clinical Bariatric Medicine  Office located at: [address] [place], [place]  [address]     Assessment and Plan:   Medications Discontinued During This Encounter  Medication Reason   FLUoxetine (PROZAC) 10 MG tablet Patient Preference   fexofenadine (ALLEGRA) 180 MG tablet Patient Preference   metFORMIN (GLUCOPHAGE) 500 MG tablet Reorder   Vitamin D, Ergocalciferol, (DRISDOL) 1.25 MG (50000 UNIT) CAPS capsule Reorder     Meds ordered this encounter  Medications   metFORMIN (GLUCOPHAGE) 500 MG tablet    Sig: 1 po with lunch and dinner daily    Dispense:  60 tablet    Refill:  0    30 d supply;  ** OV for RF **   Do not send RF request   Vitamin D, Ergocalciferol, (DRISDOL) 1.25 MG (50000 UNIT) CAPS capsule    Sig: Take 1 capsule (50,000 Units total) by mouth every 7 (seven) days.    Dispense:  4 capsule    Refill:  0   topiramate (TOPAMAX) 50 MG tablet    Sig: One half tab by mouth daily for a week at bedtime, then one tab by mouth daily.    Dispense:  30 tablet    Refill:  0     Abnormal craving Assessment: Patient endorses  having strong carb cravings on some days.  Plan:  Begin Topamax. Start with one half tab by mouth daily at bedtime for 7 days and if tolerating well, then one full tab by mouth daily. Discussed the anticipated benefits and possible risks of starting medication. All questions were answered appropriately.   Educated patient that having protein with each meal is also important for controlling hunger and cravings. Continue her prudent nutritional plan that is low in simple carbohydrates, saturated fats and trans fats to goal of 5-10% weight loss to achieve significant health benefits. Will continue to monitor condition closely.    Type 2 diabetes mellitus with obesity (HCC) Assessment: Condition is not optimized. Maria Glenn endorses feel nauseous when taking the Metformin 500 mg BID. Thus, she  decreased to 500 mg with lunch and 250 mg with dinner daily. She is tolerating this well.   Lab Results  Component Value Date   HGBA1C 7.3 (H) 12/27/2022   HGBA1C 7.1 (H) 08/09/2022   HGBA1C 6.3 (H) 02/06/2022   INSULIN 15.9 12/27/2022   INSULIN 8.3 03/14/2021   INSULIN 13.0 12/07/2020    Plan: Continue with Metformin at current dose. Will refill this today.   Again explained role of simple carbs and insulin levels on hunger and cravings. Maria Glenn will continue to work on weight loss, exercise, via their meal plan we devised to help decrease the risk of progressing to diabetes. We will recheck A1c and fasting insulin level in approximately 3 months from last check, or as deemed appropriate.     Vitamin D deficiency Assessment: Condition is not optimized. Endorses taking Ergocalciferol 50K IU weekly and is tolerating well. No concerns.   Lab Results  Component Value Date   VD25OH 27.4 (L) 12/27/2022   VD25OH 30.2 08/09/2022   VD25OH 31.1 08/10/2021   Plan: Continue with supplement. Will reorder this today.   Weight loss will likely improve availability of vitamin D, thus encouraged Maria Glenn to continue with meal plan and their weight loss efforts to further improve this condition.  Thus, we will need to monitor levels regularly (every 3-4 mo on average) to keep levels within  normal limits and prevent over supplementation.   TREATMENT PLAN FOR OBESITY: BMI 39.0-39.9,adult Current BMI 37.95 Morbid obesity-start bmi 40.7/date 12/27/22 Assessment:  Maria Glenn is here to discuss her progress with her obesity treatment plan along with follow-up of her obesity related diagnoses. See Medical Weight Management Flowsheet for complete bioelectrical impedance results.  Condition is improving.  Biometric data collected today, was reviewed with patient.   Since last office visit on 02/22/23 patient's  Muscle mass has increased by 3 lb. Fat mass has decreased by 2.8 lb. Total body water has  increased by 2.4 lb.  Counseling done on how various foods will affect these numbers and how to maximize success  Total lbs lost to date: 17 Total weight loss percentage to date: 6.75  Plan: Continue the Category 2 Plan and keeping a food journal and adhering to recommended goals of 1300-1400 calories and 90+ protein   Behavioral Intervention Additional resources provided today: patient declined Evidence-based interventions for health behavior change were utilized today including the discussion of self monitoring techniques, problem-solving barriers and SMART goal setting techniques.   Regarding patient's less desirable eating habits and patterns, we employed the technique of small changes.  Pt will specifically work on: continuing prudent nutritional plan for next visit.    Recommended Physical Activity Goals  Maria Glenn has been advised to slowly work up to 150 minutes of moderate intensity aerobic activity a week and strengthening exercises 2-3 times per week for cardiovascular health, weight loss maintenance and preservation of muscle mass.   She has agreed to Continue current level of physical activity   FOLLOW UP: Return in about 2 weeks (around 03/22/2023). She was informed of the importance of frequent follow up visits to maximize her success with intensive lifestyle modifications for her multiple health conditions.   Subjective:   Chief complaint: Obesity Maria Glenn is here to discuss her progress with her obesity treatment plan. She is on the the Category 2 Plan and keeping a food journal and adhering to recommended goals of 1300-1400 calories and 90+ protein and states she is following her eating plan approximately 50% of the time. She states she is not exercising.   Interval History:  Maria Glenn is here for a follow up office visit.     Since last office visit:    - Maria Glenn is doing well. She has been journaling her intake and learns that she needs to "stay on plan  more".  - On some days, she endorses having strong cravings for carbohydrates.   Review of Systems:  Pertinent positives were addressed with patient today.  Reviewed by clinician on day of visit: allergies, medications, problem list, medical history, surgical history, family history, social history, and previous encounter notes.  Weight Summary and Biometrics   Weight Lost Since Last Visit: 0  No data recorded   Vitals Temp: 97.8 F (36.6 C) BP: 122/83 Pulse Rate: 72 SpO2: 96 %   Anthropometric Measurements Height: 5\' 6"  (1.676 m) Weight: 235 lb (106.6 kg) BMI (Calculated): 37.95 Weight at Last Visit: 235lb Weight Lost Since Last Visit: 0 Starting Weight: 252lb Total Weight Loss (lbs): 14 lb (6.35 kg) Peak Weight: 252lb   Body Composition  Body Fat %: 41.6 % Fat Mass (lbs): 98 lbs Muscle Mass (lbs): 130.4 lbs Total Body Water (lbs): 85.4 lbs Visceral Fat Rating : 12   Other Clinical Data Fasting: yes Labs: no Today's Visit #: 6 Starting Date: 12/27/22   Objective:   PHYSICAL EXAM: Blood  pressure 122/83, pulse 72, temperature 97.8 F (36.6 C), height 5\' 6"  (1.676 m), weight 235 lb (106.6 kg), SpO2 96 %. Body mass index is 37.93 kg/m.  General: Well Developed, well nourished, and in no acute distress.  HEENT: Normocephalic, atraumatic Skin: Warm and dry, cap RF less 2 sec, good turgor Chest:  Normal excursion, shape, no gross abn Respiratory: speaking in full sentences, no conversational dyspnea NeuroM-Sk: Ambulates w/o assistance, moves * 4 Psych: A and O *3, insight good, mood-full  DIAGNOSTIC DATA REVIEWED:  BMET    Component Value Date/Time   NA 142 12/27/2022 1013   K 4.5 12/27/2022 1013   CL 102 12/27/2022 1013   CO2 23 12/27/2022 1013   GLUCOSE 122 (H) 12/27/2022 1013   GLUCOSE 155 (H) 08/17/2022 1016   BUN 16 12/27/2022 1013   CREATININE 0.78 12/27/2022 1013   CALCIUM 9.9 12/27/2022 1013   GFRNONAA >60 08/17/2022 1016   GFRAA 113  03/23/2020 1439   Lab Results  Component Value Date   HGBA1C 7.3 (H) 12/27/2022   HGBA1C 6.2 11/30/2016   Lab Results  Component Value Date   INSULIN 15.9 12/27/2022   INSULIN 8.7 03/23/2020   Lab Results  Component Value Date   TSH 1.680 12/27/2022   CBC    Component Value Date/Time   WBC 7.0 12/27/2022 1013   WBC 6.8 08/17/2022 1016   RBC 4.91 12/27/2022 1013   RBC 4.59 08/17/2022 1016   HGB 14.0 12/27/2022 1013   HGB 12.5 08/09/2015 1020   HCT 43.0 12/27/2022 1013   HCT 37.8 08/09/2015 1020   PLT 238 12/27/2022 1013   MCV 88 12/27/2022 1013   MCV 86 08/09/2015 1020   MCH 28.5 12/27/2022 1013   MCH 27.9 08/17/2022 1016   MCHC 32.6 12/27/2022 1013   MCHC 33.1 08/17/2022 1016   RDW 13.6 12/27/2022 1013   RDW 14.3 08/09/2015 1020   Iron Studies No results found for: "IRON", "TIBC", "FERRITIN", "IRONPCTSAT" Lipid Panel     Component Value Date/Time   CHOL 165 12/27/2022 1013   TRIG 150 (H) 12/27/2022 1013   HDL 69 12/27/2022 1013   CHOLHDL 2.5 08/09/2022 0934   CHOLHDL 4 01/22/2020 0906   VLDL 12.4 01/22/2020 0906   LDLCALC 71 12/27/2022 1013   Hepatic Function Panel     Component Value Date/Time   PROT 7.5 12/27/2022 1013   ALBUMIN 4.7 12/27/2022 1013   AST 21 12/27/2022 1013   ALT 23 12/27/2022 1013   ALKPHOS 97 12/27/2022 1013   BILITOT 0.3 12/27/2022 1013      Component Value Date/Time   TSH 1.680 12/27/2022 1013   Nutritional Lab Results  Component Value Date   VD25OH 27.4 (L) 12/27/2022   VD25OH 30.2 08/09/2022   VD25OH 31.1 08/10/2021    Attestations:   I, Special Puri, acting as a Stage manager for Thomasene Lot, DO., have compiled all relevant documentation for today's office visit on behalf of Thomasene Lot, DO, while in the presence of [employer], DO.  I have reviewed the above documentation for accuracy and completeness, and I agree with the above. [name], D.O.  The 21st Century Cures Act was signed into law  in 2016 which includes the topic of electronic health records.  This provides immediate access to information in MyChart.  This includes consultation notes, operative notes, office notes, lab results and pathology reports.  If you have any questions about what you read please let us know at your  next visit so we can discuss your concerns and take corrective action if need be.  We are right here with you.

## 2023-03-22 ENCOUNTER — Encounter (INDEPENDENT_AMBULATORY_CARE_PROVIDER_SITE_OTHER): Payer: Self-pay | Admitting: Family Medicine

## 2023-03-22 ENCOUNTER — Ambulatory Visit (INDEPENDENT_AMBULATORY_CARE_PROVIDER_SITE_OTHER): Payer: 59 | Admitting: Family Medicine

## 2023-03-22 VITALS — BP 103/69 | HR 77 | Temp 98.0°F | Ht 66.0 in | Wt 230.0 lb

## 2023-03-22 DIAGNOSIS — Z6837 Body mass index (BMI) 37.0-37.9, adult: Secondary | ICD-10-CM

## 2023-03-22 DIAGNOSIS — Z7984 Long term (current) use of oral hypoglycemic drugs: Secondary | ICD-10-CM

## 2023-03-22 DIAGNOSIS — E1169 Type 2 diabetes mellitus with other specified complication: Secondary | ICD-10-CM | POA: Diagnosis not present

## 2023-03-22 DIAGNOSIS — E559 Vitamin D deficiency, unspecified: Secondary | ICD-10-CM

## 2023-03-22 MED ORDER — VITAMIN D (ERGOCALCIFEROL) 1.25 MG (50000 UNIT) PO CAPS: 50000.0000 [IU] | ORAL_CAPSULE | ORAL | 0 refills | Status: DC

## 2023-03-22 NOTE — Progress Notes (Signed)
Maria Glenn, D.O.  Maria Glenn, Maria Glenn Specializing in Clinical Bariatric Medicine  Office located at: [address] [place], [place]  [address]     Assessment and Plan:   Medications Discontinued During This Encounter  Medication Reason   Vitamin D, Ergocalciferol, (DRISDOL) 1.25 MG (50000 UNIT) CAPS capsule Reorder     Meds ordered this encounter  Medications   Vitamin D, Ergocalciferol, (DRISDOL) 1.25 MG (50000 UNIT) CAPS capsule    Sig: Take 1 capsule (50,000 Units total) by mouth every 7 (seven) days.    Dispense:  4 capsule    Refill:  0     Review labs obtained from PCP next OV   Type 2 diabetes mellitus with obesity (HCC) Assessment: Her diabetes mellitus is being treated with Metformin 500 mg BID. She endorses feeling nauseous at times with this dose, but that these symptoms are  tolerable. Pt does not wish to discontinue and/or decrease dose of Metformin.  She feels that the medication is helping with her hunger and cravings. Her last fasting blood sugar  was in the 115s-120s, which has improved from prior.   Lab Results  Component Value Date   HGBA1C 7.3 (H) 12/27/2022   HGBA1C 7.1 (H) 08/09/2022   HGBA1C 6.3 (H) 02/06/2022   INSULIN 15.9 12/27/2022   INSULIN 8.3 03/14/2021   INSULIN 13.0 12/07/2020    Plan: Pt desires to continue with Metformin at current dose. Pt denies need for refill.   Reminded Maria Glenn that lifestyle changes are the first line treatment option for most all disease processes.  Education provided that "food is medicine" and I encouraged her to be mindful of how certain foods can improve or worsen her medical conditions.     Reminded Maria Glenn if she feels poorly- check Blood Sugar and Blood Pressure at that time. Continue her prudent nutritional plan that is low in simple carbohydrates, saturated fats and trans fats to goal of 5-10% weight loss to achieve significant health benefits.  Pt encouraged to continually advance  exercise and cardiovascular fitness as tolerated throughout weight loss journey.  Importance of f/up with PCP and all other specialists, as scheduled, was stressed to the patient today     Vitamin D deficiency Assessment: Condition is not optimized. Compliance of Ergocalciferol 50K IU weekly good since last labs were reviewed. No concerns.   Lab Results  Component Value Date   VD25OH 27.4 (L) 12/27/2022   VD25OH 30.2 08/09/2022   VD25OH 31.1 08/10/2021   Plan: Continue with prescribed supplement at current dose. Will refill ERGO today.    Will continue to monitor levels regularly (every 3-4 mo on average) to keep levels within normal limits and prevent over supplementation.  TREATMENT PLAN FOR OBESITY: BMI 37.0-37.9, adult- current BMI 37.14 Morbid obesity-start bmi 40.7/date4/3/24 Assessment: [name] is here to discuss her progress with her obesity treatment plan along with follow-up of her obesity related diagnoses. See Medical Weight Management Flowsheet for complete bioelectrical impedance results.  Condition is improving. Biometric data collected today, was reviewed with patient.   Since last office visit on 03/08/23 patient's  Muscle mass has increased by 0.4 lb. Fat mass has decreased by 5 lb. Total body water has decreased by 2.6 lb.  Counseling done on how various foods will affect these numbers and how to maximize success  Total lbs lost to date: 22  Total weight loss percentage to date: 8.73  Plan: Continue the Category 2 Plan and  keeping a food journal and adhering to recommended goals of 1300-1400 calories and 90+ protein   - I showed [name] how to properly to calf stretches.   Behavioral Intervention Additional resources provided today: patient declined Evidence-based interventions for health behavior change were utilized today including the discussion of self monitoring techniques, problem-solving barriers and SMART goal setting techniques.   Regarding  patient's less desirable eating habits and patterns, we employed the technique of small changes.  Pt will specifically work on: continue with journaling her intake/bringing in food log and increasing walking on treadmill to 30 minutes 4-5 days a wk for next visit.    Recommended Physical Activity Goals  Maria Glenn has been advised to slowly work up to 150 minutes of moderate intensity aerobic activity a week and strengthening exercises 2-3 times per week for cardiovascular health, weight loss maintenance and preservation of muscle mass.   She has agreed to Continue current level of physical activity   FOLLOW UP: Return in about 3 weeks (around 04/12/2023). She was informed of the importance of frequent follow up visits to maximize her success with intensive lifestyle modifications for her multiple health conditions.  Subjective:   Chief complaint: Obesity [name] is here to discuss her progress with her obesity treatment plan. She is on the the Category 2 Plan and keeping a food journal and adhering to recommended goals of 1300-1400 calories and 90+ protein and states she is following her eating plan approximately 85% of the time. She states she is walking on treadmill 30 minutes 3 days per week.  Interval History:  Maria Glenn is here for a follow up office visit. Since last OV, she endorses good tolerance of the Topamax that we started on 03/08/23. Her hunger and cravings have improved with medications and when following the prudent nutritional plan. She's been intentional about increasing her protein intake and decreasing her carbohydrate consumption. She has no concerns with the meal plan today.   Pharmacotherapy for weight loss: She is currently taking  Topamax and Metformin  for medical weight loss.  Denies side effects.    Review of Systems:  Pertinent positives were addressed with patient today.  Reviewed by clinician on day of visit: allergies, medications, problem list, medical history,  surgical history, family history, social history, and previous encounter notes.  Weight Summary and Biometrics   Weight Lost Since Last Visit: 5lb  Weight Gained Since Last Visit: 0lb    Vitals Temp: 98 F (36.7 C) BP: 103/69 Pulse Rate: 77 SpO2: 97 %   Anthropometric Measurements Height: 5\' 6"  (1.676 m) Weight: 230 lb (104.3 kg) BMI (Calculated): 37.14 Weight at Last Visit: 235lb Weight Lost Since Last Visit: 5lb Weight Gained Since Last Visit: 0lb Starting Weight: 252lb Total Weight Loss (lbs): 19 lb (8.618 kg) Peak Weight: 252lb   Body Composition  Body Fat %: 40.3 % Fat Mass (lbs): 93 lbs Muscle Mass (lbs): 130.8 lbs Total Body Water (lbs): 82.8 lbs Visceral Fat Rating : 12   Other Clinical Data Fasting: no Labs: no Today's Visit #: 7 Starting Date: 12/27/22   Objective:   PHYSICAL EXAM: Blood pressure 103/69, pulse 77, temperature 98 F (36.7 C), height 5\' 6"  (1.676 m), weight 230 lb (104.3 kg), SpO2 97 %. Body mass index is 37.12 kg/m.  General: Well Developed, well nourished, and in no acute distress.  HEENT: Normocephalic, atraumatic Skin: Warm and dry, cap RF less 2 sec, good turgor Chest:  Normal excursion, shape, no gross abn Respiratory: speaking  in full sentences, no conversational dyspnea NeuroM-Sk: Ambulates w/o assistance, moves * 4 Psych: A and O *3, insight good, mood-full  DIAGNOSTIC DATA REVIEWED:  BMET    Component Value Date/Time   NA 142 12/27/2022 1013   K 4.5 12/27/2022 1013   CL 102 12/27/2022 1013   CO2 23 12/27/2022 1013   GLUCOSE 122 (H) 12/27/2022 1013   GLUCOSE 155 (H) 08/17/2022 1016   BUN 16 12/27/2022 1013   CREATININE 0.78 12/27/2022 1013   CALCIUM 9.9 12/27/2022 1013   GFRNONAA >60 08/17/2022 1016   GFRAA 113 03/23/2020 1439   Lab Results  Component Value Date   HGBA1C 7.3 (H) 12/27/2022   HGBA1C 6.2 11/30/2016   Lab Results  Component Value Date   INSULIN 15.9 12/27/2022   INSULIN 8.7 03/23/2020    Lab Results  Component Value Date   TSH 1.680 12/27/2022   CBC    Component Value Date/Time   WBC 7.0 12/27/2022 1013   WBC 6.8 08/17/2022 1016   RBC 4.91 12/27/2022 1013   RBC 4.59 08/17/2022 1016   HGB 14.0 12/27/2022 1013   HGB 12.5 08/09/2015 1020   HCT 43.0 12/27/2022 1013   HCT 37.8 08/09/2015 1020   PLT 238 12/27/2022 1013   MCV 88 12/27/2022 1013   MCV 86 08/09/2015 1020   MCH 28.5 12/27/2022 1013   MCH 27.9 08/17/2022 1016   MCHC 32.6 12/27/2022 1013   MCHC 33.1 08/17/2022 1016   RDW 13.6 12/27/2022 1013   RDW 14.3 08/09/2015 1020   Iron Studies No results found for: "IRON", "TIBC", "FERRITIN", "IRONPCTSAT" Lipid Panel     Component Value Date/Time   CHOL 165 12/27/2022 1013   TRIG 150 (H) 12/27/2022 1013   HDL 69 12/27/2022 1013   CHOLHDL 2.5 08/09/2022 0934   CHOLHDL 4 01/22/2020 0906   VLDL 12.4 01/22/2020 0906   LDLCALC 71 12/27/2022 1013   Hepatic Function Panel     Component Value Date/Time   PROT 7.5 12/27/2022 1013   ALBUMIN 4.7 12/27/2022 1013   AST 21 12/27/2022 1013   ALT 23 12/27/2022 1013   ALKPHOS 97 12/27/2022 1013   BILITOT 0.3 12/27/2022 1013      Component Value Date/Time   TSH 1.680 12/27/2022 1013   Nutritional Lab Results  Component Value Date   VD25OH 27.4 (L) 12/27/2022   VD25OH 30.2 08/09/2022   VD25OH 31.1 08/10/2021    Attestations:   I, Special Puri, acting as a Stage manager for Thomasene Lot, DO., have compiled all relevant documentation for today's office visit on behalf of Thomasene Lot, DO, while in the presence of [employer], DO.  I have reviewed the above documentation for accuracy and completeness, and I agree with the above. Maria Glenn, D.O.  The 21st Century Cures Act was signed into law in 2016 which includes the topic of electronic health records.  This provides immediate access to information in MyChart.  This includes consultation notes, operative notes, office notes, lab results  and pathology reports.  If you have any questions about what you read please let us know at your next visit so we can discuss your concerns and take corrective action if need be.  We are right here with you.

## 2023-04-02 ENCOUNTER — Ambulatory Visit (HOSPITAL_BASED_OUTPATIENT_CLINIC_OR_DEPARTMENT_OTHER): Payer: 59 | Admitting: Family Medicine

## 2023-04-10 ENCOUNTER — Ambulatory Visit (HOSPITAL_BASED_OUTPATIENT_CLINIC_OR_DEPARTMENT_OTHER): Payer: 59 | Admitting: Family Medicine

## 2023-04-10 ENCOUNTER — Encounter (HOSPITAL_BASED_OUTPATIENT_CLINIC_OR_DEPARTMENT_OTHER): Payer: Self-pay | Admitting: Family Medicine

## 2023-04-10 VITALS — BP 135/65 | HR 77 | Temp 97.7°F | Ht 66.0 in | Wt 231.0 lb

## 2023-04-10 DIAGNOSIS — R001 Bradycardia, unspecified: Secondary | ICD-10-CM | POA: Diagnosis not present

## 2023-04-10 DIAGNOSIS — E119 Type 2 diabetes mellitus without complications: Secondary | ICD-10-CM | POA: Diagnosis not present

## 2023-04-10 DIAGNOSIS — E538 Deficiency of other specified B group vitamins: Secondary | ICD-10-CM

## 2023-04-10 DIAGNOSIS — E559 Vitamin D deficiency, unspecified: Secondary | ICD-10-CM | POA: Diagnosis not present

## 2023-04-10 NOTE — Patient Instructions (Signed)
  Medication Instructions:  Your physician recommends that you continue on your current medications as directed. Please refer to the Current Medication list given to you today. --If you need a refill on any your medications before your next appointment, please call your pharmacy first. If no refills are authorized on file call the office.-- Lab Work: Your physician has recommended that you have lab work today: Yes If you have labs (blood work) drawn today and your tests are completely normal, you will receive your results via MyChart message OR a phone call from our staff.  Please ensure you check your voicemail in the event that you authorized detailed messages to be left on a delegated number. If you have any lab test that is abnormal or we need to change your treatment, we will call you to review the results.  Referrals/Procedures/Imaging: No  Follow-Up: Your next appointment:   Your physician recommends that you schedule a follow-up appointment in: 3 months with Dr. de Cuba.  You will receive a text message or e-mail with a link to a survey about your care and experience with us today! We would greatly appreciate your feedback!   Thanks for letting us be apart of your health journey!!  Primary Care and Sports Medicine   Dr. Raymond de Cuba   We encourage you to activate your patient portal called "MyChart".  Sign up information is provided on this After Visit Summary.  MyChart is used to connect with patients for Virtual Visits (Telemedicine).  Patients are able to view lab/test results, encounter notes, upcoming appointments, etc.  Non-urgent messages can be sent to your provider as well. To learn more about what you can do with MyChart, please visit --  [email or web address].    

## 2023-04-10 NOTE — Assessment & Plan Note (Signed)
Noted on prior labs, currently utilizing supplementation.  Requesting to have repeat labs today for monitoring We will check vitamin B12 level today

## 2023-04-10 NOTE — Assessment & Plan Note (Signed)
Recently, patient noted that she had some lightheadedness and felt as though she may pass out and checked her vitals at that time.  Her blood pressure was normal, however her heart rate was fairly low for her.  She reports that heart rate was in the 50s when typically it is in the 70s or above.  Only recent changes were addition of metformin as well as topiramate.  Given this, she decided to stop both medications.  She has not had any further heart rate issues since doing so.  She denies having any chest pain, shortness of breath, feelings of palpitations. On exam, patient is in no acute distress, vital signs stable.  Cardiovascular exam with regular rate and rhythm, no murmur appreciated.  Lungs clear to auscultation bilaterally. Patient with possible that experienced symptoms were related to adverse reaction of medication.  Between the 2 medications, feel will be more likely related to topiramate as opposed to metformin.  Patient amenable to resuming low-dose of metformin and monitoring symptoms.  If she does notice any recurrence of side effects, can stop medication.  Advised on discussing topiramate with [facility] clinic to see if they would want to switch her to an alternative medication

## 2023-04-10 NOTE — Assessment & Plan Note (Signed)
Noted on prior labs, currently utilizing supplementation.  Requesting to have repeat labs today for monitoring We will check vitamin D level today

## 2023-04-10 NOTE — Assessment & Plan Note (Signed)
Patient continues with lifestyle modification.  Most recent hemoglobin A1c was slightly above goal at 7.3%.  She was started on metformin through [facility].  She also started topiramate around the same time, see below for discussion regarding medications She has not had any issues with polyuria or polydipsia.  She has been able to lose a little bit over 10 pounds, encouraged to continue with lifestyle modifications We will check hemoglobin A1c today as well as urine microalbumin/creatinine ratio Discussed recommendation for pneumococcal vaccination, patient would be interested, however would prefer to hold off for now which is reasonable Plan to complete foot exam at next office visit

## 2023-04-10 NOTE — Progress Notes (Signed)
    Procedures performed today:    None.  Independent interpretation of notes and tests performed by another provider:   None.  Brief History, Exam, Impression, and Recommendations:    BP 135/65 (BP Location: Right Arm, Patient Position: Sitting, Cuff Size: Large)   Pulse 77   Temp 97.7 F (36.5 C)   Ht 5\' 6"  (1.676 m)   Wt 231 lb (104.8 kg)   SpO2 99%   BMI 37.28 kg/m   Type 2 diabetes mellitus without complication, without long-term current use of insulin (HCC) Assessment & Plan: Patient continues with lifestyle modification.  Most recent hemoglobin A1c was slightly above goal at 7.3%.  She was started on metformin through [facility].  She also started topiramate around the same time, see below for discussion regarding medications She has not had any issues with polyuria or polydipsia.  She has been able to lose a little bit over 10 pounds, encouraged to continue with lifestyle modifications We will check hemoglobin A1c today as well as urine microalbumin/creatinine ratio Discussed recommendation for pneumococcal vaccination, patient would be interested, however would prefer to hold off for now which is reasonable Plan to complete foot exam at next office visit  Orders: -     Hemoglobin A1c -     Microalbumin / creatinine urine ratio  Vitamin D deficiency Assessment & Plan: Noted on prior labs, currently utilizing supplementation.  Requesting to have repeat labs today for monitoring We will check vitamin D level today  Orders: -     VITAMIN D 25 Hydroxy (Vit-D Deficiency, Fractures)  B12 deficiency Assessment & Plan: Noted on prior labs, currently utilizing supplementation.  Requesting to have repeat labs today for monitoring We will check vitamin B12 level today  Orders: -     Vitamin B12  Bradycardia Assessment & Plan: Recently, patient noted that she had some lightheadedness and felt as though she may pass out and checked her vitals at  that time.  Her blood pressure was normal, however her heart rate was fairly low for her.  She reports that heart rate was in the 50s when typically it is in the 70s or above.  Only recent changes were addition of metformin as well as topiramate.  Given this, she decided to stop both medications.  She has not had any further heart rate issues since doing so.  She denies having any chest pain, shortness of breath, feelings of palpitations. On exam, patient is in no acute distress, vital signs stable.  Cardiovascular exam with regular rate and rhythm, no murmur appreciated.  Lungs clear to auscultation bilaterally. Patient with possible that experienced symptoms were related to adverse reaction of medication.  Between the 2 medications, feel will be more likely related to topiramate as opposed to metformin.  Patient amenable to resuming low-dose of metformin and monitoring symptoms.  If she does notice any recurrence of side effects, can stop medication.  Advised on discussing topiramate with healthy weight wellness clinic to see if they would want to switch her to an alternative medication   Return in about 3 months (around 07/11/2023) for diabetes.   ___________________________________________ Modesta Sammons de [place], MD, ABFM, CAQSM Primary Care and Sports Medicine [facility]

## 2023-04-11 LAB — HEMOGLOBIN A1C
Est. average glucose Bld gHb Est-mCnc: 134 mg/dL
Hgb A1c MFr Bld: 6.3 % — ABNORMAL HIGH (ref 4.8–5.6)

## 2023-04-11 LAB — MICROALBUMIN / CREATININE URINE RATIO
Creatinine, Urine: 29 mg/dL
Microalb/Creat Ratio: <10 mg/g creat (ref 0–29)
Microalbumin, Urine: <3 ug/mL

## 2023-04-11 LAB — VITAMIN D 25 HYDROXY (VIT D DEFICIENCY, FRACTURES): Vit D, 25-Hydroxy: 76.9 ng/mL (ref 30.0–100.0)

## 2023-04-11 LAB — VITAMIN B12: Vitamin B-12: 1186 pg/mL (ref 232–1245)

## 2023-04-12 ENCOUNTER — Encounter (INDEPENDENT_AMBULATORY_CARE_PROVIDER_SITE_OTHER): Payer: Self-pay | Admitting: Family Medicine

## 2023-04-12 ENCOUNTER — Ambulatory Visit (INDEPENDENT_AMBULATORY_CARE_PROVIDER_SITE_OTHER): Payer: 59 | Admitting: Family Medicine

## 2023-04-12 VITALS — BP 110/76 | HR 71 | Temp 97.6°F | Ht 66.0 in | Wt 226.0 lb

## 2023-04-12 DIAGNOSIS — R638 Other symptoms and signs concerning food and fluid intake: Secondary | ICD-10-CM | POA: Diagnosis not present

## 2023-04-12 DIAGNOSIS — E1169 Type 2 diabetes mellitus with other specified complication: Secondary | ICD-10-CM | POA: Diagnosis not present

## 2023-04-12 DIAGNOSIS — E538 Deficiency of other specified B group vitamins: Secondary | ICD-10-CM | POA: Diagnosis not present

## 2023-04-12 DIAGNOSIS — E559 Vitamin D deficiency, unspecified: Secondary | ICD-10-CM | POA: Diagnosis not present

## 2023-04-12 DIAGNOSIS — Z7984 Long term (current) use of oral hypoglycemic drugs: Secondary | ICD-10-CM

## 2023-04-12 DIAGNOSIS — E669 Obesity, unspecified: Secondary | ICD-10-CM

## 2023-04-12 DIAGNOSIS — Z6836 Body mass index (BMI) 36.0-36.9, adult: Secondary | ICD-10-CM

## 2023-04-12 MED ORDER — METFORMIN HCL 500 MG PO TABS
ORAL_TABLET | ORAL | 0 refills | Status: DC
Start: 2023-04-12 — End: 2023-05-15

## 2023-04-12 MED ORDER — CYANOCOBALAMIN 500 MCG PO TABS
500.0000 ug | ORAL_TABLET | Freq: Every day | ORAL | Status: DC
Start: 2023-04-12 — End: 2023-08-06

## 2023-04-12 MED ORDER — VITAMIN D (ERGOCALCIFEROL) 1.25 MG (50000 UNIT) PO CAPS
ORAL_CAPSULE | ORAL | 0 refills | Status: DC
Start: 2023-04-12 — End: 2023-04-26

## 2023-04-12 NOTE — Progress Notes (Signed)
Maria Glenn, D.O.  ABFM, ABOM Specializing in Clinical Bariatric Medicine  Office located at: [address] [place], [place]  [address]     Assessment and Plan:   Medications Discontinued During This Encounter  Medication Reason   topiramate (TOPAMAX) 50 MG tablet    cyanocobalamin (VITAMIN B12) 500 MCG tablet Reorder   metFORMIN (GLUCOPHAGE) 500 MG tablet Reorder   Vitamin D, Ergocalciferol, (DRISDOL) 1.25 MG (50000 UNIT) CAPS capsule Reorder     Meds ordered this encounter  Medications   Vitamin D, Ergocalciferol, (DRISDOL) 1.25 MG (50000 UNIT) CAPS capsule    Sig: 1 po q 10 days    Dispense:  3 capsule    Refill:  0   metFORMIN (GLUCOPHAGE) 500 MG tablet    Sig: 1 po with lunch and dinner daily    Dispense:  60 tablet    Refill:  0    30 d supply;  ** OV for RF **   Do not send RF request   cyanocobalamin (VITAMIN B12) 500 MCG tablet    Sig: Take 1 tablet (500 mcg total) by mouth daily.     Abnormal craving Assessment & Plan: Pt endorses that she stopped taking Topiramate last Saturday (July 13th, 2024) due to side effects. Notes having had lightheadedness and feeling like she may pass out. She checked her vitals: Blood pressure was normal, however her heart rate was fairly low for her. She reports that her heart rate was in the 50s when typically it is in the 70s or above. Since stopping the Topiramate, her symptoms have been resolved.   Discontinue Topiramate due to side effects. Continue her prudent nutritional plan that is low in simple carbohydrates, saturated fats and trans fats to goal of 5-10% weight loss to achieve significant health benefits. Will continue to monitor condition as deemed clinically necessary.    Type 2 diabetes mellitus with obesity (HCC) Assessment & Plan: Her diabetes mellitus is being treated with Metformin 500 mg. Pt endorses being off her Metformin for a few days when experiencing the Topamax side effects described above. She is  back to taking her Metformin and is slowly advancing to 1 full tablet with lunch and dinner. Reports having some tolerable heart burn as a side effect of the Metformin and wishes to continue with medication. Her most recent labs from PCP showed that her A1c levels have dropped by 1 point.   Lab Results  Component Value Date   HGBA1C 6.3 (H) 04/10/2023   HGBA1C 7.3 (H) 12/27/2022   HGBA1C 7.1 (H) 08/09/2022   INSULIN 15.9 12/27/2022   INSULIN 8.3 03/14/2021   INSULIN 13.0 12/07/2020    Will refill Metformin today. Continue to decrease simple carbs/ sugars; increase fiber and proteins -> follow her meal plan. We will recheck A1c and fasting insulin level in approximately 3 months from last check, or as deemed appropriate.    Vitamin D deficiency Assessment & Plan: Labs below with PCP indicate that her Vitamin D levels are within the normal range of 50-80. She is currently taking Ergocalciferol 50K international units once weekly and is tolerating supplement well. Labs were reviewed with pt today and all questions were answered about them.     Lab Results  Component Value Date   VD25OH 76.9 04/10/2023   VD25OH 27.4 (L) 12/27/2022   VD25OH 30.2 08/09/2022   Change Vitamin D 50K lU to q 10 days. Will refill ERGO today. Weight loss will likely improve availability  of vitamin D, thus encouraged Girl to continue with meal plan and their weight loss efforts to further improve this condition.  Thus, we will need to monitor levels regularly (every 3-4 mo on average) to keep levels within normal limits and prevent over supplementation.   B12 deficiency Assessment & Plan: Labs below from PCP indicate that her B12 levels are within the recommended range of 500+. She is on OTC Cyanocobalamin 500 mcg daily and reports sometimes forgetting to take it.  Labs were reviewed with patient today and education provided on them All of the patient's questions about them were answered    Lab Results  Component  Value Date   VITAMINB12 1,186 04/10/2023   Continue with OTC B12 supplement. Continue their prudent nutritional plan and focus on b12 rich foods such as lean red meats; poultry; eggs; seafood; beans, peas, and lentils; nuts and seeds; and soy products. We will continue to monitor as deemed clinically necessary.   TREATMENT PLAN FOR OBESITY: BMI 36.0-36.9,adult- current bmi 36.49 Morbid obesity-start bmi 40.7/date4/3/24 Assessment: Maria Glenn is here to discuss her progress with her obesity treatment plan along with follow-up of her obesity related diagnoses. See Medical Weight Management Flowsheet for complete bioelectrical impedance results.  Condition is increasing. Biometric data collected today, was reviewed with patient.   Since last office visit on 03/22/23 patient's  Muscle mass has increased by 4.4 lb. Fat mass has decreased by 1.6 lb. Total body water has increased by 0.2 lb.  Counseling done on how various foods will affect these numbers and how to maximize success  Total lbs lost to date: - 26 lbs Total weight loss percentage to date: 10.32%  Plan: Continue the Category 2 Plan and keeping a food journal and adhering to recommended goals of 1300-1400 calories and 90+ protein   Behavioral Intervention Additional resources provided today: patient declined Evidence-based interventions for health behavior change were utilized today including the discussion of self monitoring techniques, problem-solving barriers and SMART goal setting techniques.   Regarding patient's less desirable eating habits and patterns, we employed the technique of small changes.  Pt will specifically work on: doing some form of physical activity 30 minutes, 3 days a wk for next visit.    Recommended Physical Activity Goals  Maria Glenn has been advised to slowly work up to 150 minutes of moderate intensity aerobic activity a week and strengthening exercises 2-3 times per week for cardiovascular health, weight  loss maintenance and preservation of muscle mass.   She has agreed to Think about ways to increase daily physical activity and overcoming barriers to exercise  FOLLOW UP: Return in about 2 weeks (around 04/26/2023). She was informed of the importance of frequent follow up visits to maximize her success with intensive lifestyle modifications for her multiple health conditions.  Subjective:   Chief complaint: Obesity Maria Glenn is here to discuss her progress with her obesity treatment plan. She is on the Category 2 Plan and keeping a food journal and adhering to recommended goals of 1300-1400 calories and 90+ protein and states she is following her eating plan approximately 65% of the time. She states she is walking on the treadmill 30 minutes 2 days per week.  Interval History:  Maria Glenn is here for a follow up office visit. Since last OV, Maria Glenn has been doing well. Endorses journaling sporadically and has been more mindful about increasing her protein intake and decreasing fatty carbs. She has no complaints with the meal plan. Hunger and cravings  are pretty well controlled.   Pharmacotherapy for weight loss: She is currently taking Metformin  for medical weight loss.  Denies side effects.    Review of Systems:  Pertinent positives were addressed with patient today.  Reviewed by clinician on day of visit: allergies, medications, problem list, medical history, surgical history, family history, social history, and previous encounter notes.  Weight Summary and Biometrics   Weight Lost Since Last Visit: 4lb  Weight Gained Since Last Visit: 0lb    Vitals Temp: 97.6 F (36.4 C) BP: 110/76 Pulse Rate: 71 SpO2: 96 %   Anthropometric Measurements Height: 5\' 6"  (1.676 m) Weight: 226 lb (102.5 kg) BMI (Calculated): 36.49 Weight at Last Visit: 230lb Weight Lost Since Last Visit: 4lb Weight Gained Since Last Visit: 0lb Starting Weight: 252lb Total Weight Loss (lbs): 23 lb (10.4  kg) Peak Weight: 252lb   Body Composition  Body Fat %: 40.3 % Fat Mass (lbs): 91.4 lbs Muscle Mass (lbs): 135.2 lbs Total Body Water (lbs): 83 lbs Visceral Fat Rating : 11   Other Clinical Data Fasting: no Labs: no Today's Visit #: 8 Starting Date: 12/27/22   Objective:   PHYSICAL EXAM: Blood pressure 110/76, pulse 71, temperature 97.6 F (36.4 C), height 5\' 6"  (1.676 m), weight 226 lb (102.5 kg), SpO2 96%. Body mass index is 36.48 kg/m.  General: Well Developed, well nourished, and in no acute distress.  HEENT: Normocephalic, atraumatic Skin: Warm and dry, cap RF less 2 sec, good turgor Chest:  Normal excursion, shape, no gross abn Respiratory: speaking in full sentences, no conversational dyspnea NeuroM-Sk: Ambulates w/o assistance, moves * 4 Psych: A and O *3, insight good, mood-full  DIAGNOSTIC DATA REVIEWED:  BMET    Component Value Date/Time   NA 142 12/27/2022 1013   K 4.5 12/27/2022 1013   CL 102 12/27/2022 1013   CO2 23 12/27/2022 1013   GLUCOSE 122 (H) 12/27/2022 1013   GLUCOSE 155 (H) 08/17/2022 1016   BUN 16 12/27/2022 1013   CREATININE 0.78 12/27/2022 1013   CALCIUM 9.9 12/27/2022 1013   GFRNONAA >60 08/17/2022 1016   GFRAA 113 03/23/2020 1439   Lab Results  Component Value Date   HGBA1C 6.3 (H) 04/10/2023   HGBA1C 6.2 11/30/2016   Lab Results  Component Value Date   INSULIN 15.9 12/27/2022   INSULIN 8.7 03/23/2020   Lab Results  Component Value Date   TSH 1.680 12/27/2022   CBC    Component Value Date/Time   WBC 7.0 12/27/2022 1013   WBC 6.8 08/17/2022 1016   RBC 4.91 12/27/2022 1013   RBC 4.59 08/17/2022 1016   HGB 14.0 12/27/2022 1013   HGB 12.5 08/09/2015 1020   HCT 43.0 12/27/2022 1013   HCT 37.8 08/09/2015 1020   PLT 238 12/27/2022 1013   MCV 88 12/27/2022 1013   MCV 86 08/09/2015 1020   MCH 28.5 12/27/2022 1013   MCH 27.9 08/17/2022 1016   MCHC 32.6 12/27/2022 1013   MCHC 33.1 08/17/2022 1016   RDW 13.6 12/27/2022  1013   RDW 14.3 08/09/2015 1020   Iron Studies No results found for: "IRON", "TIBC", "FERRITIN", "IRONPCTSAT" Lipid Panel     Component Value Date/Time   CHOL 165 12/27/2022 1013   TRIG 150 (H) 12/27/2022 1013   HDL 69 12/27/2022 1013   CHOLHDL 2.5 08/09/2022 0934   CHOLHDL 4 01/22/2020 0906   VLDL 12.4 01/22/2020 0906   LDLCALC 71 12/27/2022 1013   Hepatic Function Panel  Component Value Date/Time   PROT 7.5 12/27/2022 1013   ALBUMIN 4.7 12/27/2022 1013   AST 21 12/27/2022 1013   ALT 23 12/27/2022 1013   ALKPHOS 97 12/27/2022 1013   BILITOT 0.3 12/27/2022 1013      Component Value Date/Time   TSH 1.680 12/27/2022 1013   Nutritional Lab Results  Component Value Date   VD25OH 76.9 04/10/2023   VD25OH 27.4 (L) 12/27/2022   VD25OH 30.2 08/09/2022    Attestations:   I, Special Puri , acting as a Stage manager for Thomasene Lot, DO., have compiled all relevant documentation for today's office visit on behalf of Thomasene Lot, DO, while in the presence of [employer], DO.  I have reviewed the above documentation for accuracy and completeness, and I agree with the above. Maria Glenn, D.O.  The 21st Century Cures Act was signed into law in 2016 which includes the topic of electronic health records.  This provides immediate access to information in MyChart.  This includes consultation notes, operative notes, office notes, lab results and pathology reports.  If you have any questions about what you read please let us know at your next visit so we can discuss your concerns and take corrective action if need be.  We are right here with you.

## 2023-04-26 ENCOUNTER — Encounter (INDEPENDENT_AMBULATORY_CARE_PROVIDER_SITE_OTHER): Payer: Self-pay | Admitting: Family Medicine

## 2023-04-26 ENCOUNTER — Ambulatory Visit (INDEPENDENT_AMBULATORY_CARE_PROVIDER_SITE_OTHER): Payer: 59 | Admitting: Family Medicine

## 2023-04-26 VITALS — BP 113/67 | HR 74 | Temp 97.9°F | Ht 66.0 in | Wt 225.0 lb

## 2023-04-26 DIAGNOSIS — K760 Fatty (change of) liver, not elsewhere classified: Secondary | ICD-10-CM | POA: Diagnosis not present

## 2023-04-26 DIAGNOSIS — E559 Vitamin D deficiency, unspecified: Secondary | ICD-10-CM

## 2023-04-26 DIAGNOSIS — Z6836 Body mass index (BMI) 36.0-36.9, adult: Secondary | ICD-10-CM

## 2023-04-26 DIAGNOSIS — E785 Hyperlipidemia, unspecified: Secondary | ICD-10-CM | POA: Diagnosis not present

## 2023-04-26 DIAGNOSIS — E1169 Type 2 diabetes mellitus with other specified complication: Secondary | ICD-10-CM

## 2023-04-26 DIAGNOSIS — Z7984 Long term (current) use of oral hypoglycemic drugs: Secondary | ICD-10-CM

## 2023-04-26 MED ORDER — VITAMIN D (ERGOCALCIFEROL) 1.25 MG (50000 UNIT) PO CAPS
ORAL_CAPSULE | ORAL | 0 refills | Status: DC
Start: 2023-04-26 — End: 2023-06-04

## 2023-04-26 MED ORDER — TIRZEPATIDE 2.5 MG/0.5ML ~~LOC~~ SOAJ
2.5000 mg | SUBCUTANEOUS | 0 refills | Status: DC
Start: 2023-04-26 — End: 2023-05-15

## 2023-04-26 NOTE — Progress Notes (Signed)
Maria Glenn, D.O.  ABFM, ABOM Specializing in Clinical Bariatric Medicine  Office located at: [address] [place], [place]  [address]     Assessment and Plan:   Medications Discontinued During This Encounter  Medication Reason   Vitamin D, Ergocalciferol, (DRISDOL) 1.25 MG (50000 UNIT) CAPS capsule Reorder     Meds ordered this encounter  Medications   Vitamin D, Ergocalciferol, (DRISDOL) 1.25 MG (50000 UNIT) CAPS capsule    Sig: 1 po q 10 days    Dispense:  3 capsule    Refill:  0   tirzepatide (MOUNJARO) 2.5 MG/0.5ML Pen    Sig: Inject 2.5 mg into the skin once a week.    Dispense:  2 mL    Refill:  0     Type 2 diabetes mellitus with obesity (HCC) Assessment & Plan: Her diabetes mellitus is currently being treated with Metformin 500 mg bid- denies N/V/D. Notes not checking blood sugar regularly at home - this morning fasting blood sugar was 116. Pt has tried Ozempic and reports that it made her nauseated/sick to her stomach and ultimately failed a trial of this medicine.   Lab Results  Component Value Date   HGBA1C 6.3 (H) 04/10/2023   HGBA1C 7.3 (H) 12/27/2022   HGBA1C 7.1 (H) 08/09/2022   INSULIN 15.9 12/27/2022   INSULIN 8.3 03/14/2021   INSULIN 13.0 12/07/2020    Continue with Metformin at current dose. Although she feels hunger and cravings are better controlled, there could still be improvements. Pt instructed to begin Mounjaro 2.5 mg once weekly. Patient denies a personal history of pancreatitis;  and denies a family history of medullary thyroid carcinoma or multiple endocrine neoplasia type II.  I informed the patient that the demand for GLP-1-like medications is high and supply is low, therefore the medication can be difficult to obtain.  Strategies to obtain discussed with patient.  Potential risks/ benefits of the medication were explained to patient. Explained to pt that medication may improve her other existing medical conditions, such as NAFLD  and Hyperlipidemia. Explained that the more she eats "off-plan", the more likely for her to have side effects of the drug.  All questions were answered appropriately and alternative treatment options were discussed; patient wishes to move forward with the medication at this time. Will continue to monitor condition alongside PCP/specialists as it relates to their weight loss journey    Vitamin D deficiency Assessment & Plan: Pt endorses taking ERGO 50K international units once weekly. She has not changed to every 10 days as directed at last OV, however she plans to after finishing the remaining once weekly script.   Lab Results  Component Value Date   VD25OH 76.9 04/10/2023   VD25OH 27.4 (L) 12/27/2022   VD25OH 30.2 08/09/2022   Continue with their weight loss efforts and will refill ERGO today. Will recheck levels in 3-4 months.    TREATMENT PLAN FOR OBESITY: BMI 36.0-36.9,adult- current bmi 36.33 Morbid obesity-start bmi 40.7/date4/3/24 Assessment:  Maria Glenn is here to discuss her progress with her obesity treatment plan along with follow-up of her obesity related diagnoses. See Medical Weight Management Flowsheet for complete bioelectrical impedance results.  Condition is not optimized. Biometric data collected today, was reviewed with patient.   Since last office visit on 04/12/23 patient's  Muscle mass has decreased by 9 lb. Fat mass has increased by 1 lb. Total body water has decreased by 0.4 lb.  Counseling done on how various foods  will affect these numbers and how to maximize success  Total lbs lost to date: 27 lbs  Total weight loss percentage to date: 10.71%   Reduce journaling parameters to 1200-1300 calories and 90+ grams protein with the Category 2 meal plan as a guide.   Behavioral Intervention Additional resources provided today:  Food Journaling Log Evidence-based interventions for health behavior change were utilized today including the discussion of self  monitoring techniques, problem-solving barriers and SMART goal setting techniques.   Regarding patient's less desirable eating habits and patterns, we employed the technique of small changes.  Pt will specifically work on: increasing her lean protein intake and continuing with current exercise regiment for next visit.    Recommended Physical Activity Goals Maria Glenn has been advised to slowly work up to 150 minutes of moderate intensity aerobic activity a week and strengthening exercises 2-3 times per week for cardiovascular health, weight loss maintenance and preservation of muscle mass. She has agreed to Continue current level of physical activity   FOLLOW UP: Return in about 19 days (around 05/15/2023). She was informed of the importance of frequent follow up visits to maximize her success with intensive lifestyle modifications for her multiple health conditions.  Subjective:   Chief complaint: Obesity Maria Glenn is here to discuss her progress with her obesity treatment plan. She is on the Category 2 Plan and keeping a food journal and adhering to recommended goals of 1300-1400 calories and 90+ protein and states she is following her eating plan approximately 80% of the time. She states she is using the treadmill 30 minutes 3-4 days per week.  Interval History:  Maria Glenn is here for a follow up office visit. Maria Glenn reports being slightly frustrated because she expected to lose more weight since last OV. On most days, she adheres to her recommended calorie and protein goals.  Endorses that her hunger and cravings are better controlled. Drinks over 100+ ounces of water daily.   Pharmacotherapy for weight loss: She is currently taking  Metformin 500 mg bid  for medical weight loss.  Denies side effects.    Review of Systems:  Pertinent positives were addressed with patient today.  Reviewed by clinician on day of visit: allergies, medications, problem list, medical history, surgical history,  family history, social history, and previous encounter notes.  Weight Summary and Biometrics   Weight Lost Since Last Visit: 1lb  Weight Gained Since Last Visit: 0lb   Vitals Temp: 97.9 F (36.6 C) BP: 113/67 Pulse Rate: 74 SpO2: 97 %   Anthropometric Measurements Height: 5\' 6"  (1.676 m) Weight: 225 lb (102.1 kg) BMI (Calculated): 36.33 Weight at Last Visit: 226lb Weight Lost Since Last Visit: 1lb Weight Gained Since Last Visit: 0lb Starting Weight: 252lb Total Weight Loss (lbs): 27 lb (12.2 kg) Peak Weight: 252lb   Body Composition  Body Fat %: 41 % Fat Mass (lbs): 92.4 lbs Muscle Mass (lbs): 126.2 lbs Total Body Water (lbs): 82.6 lbs Visceral Fat Rating : 12   Other Clinical Data Fasting: no Labs: no Today's Visit #: 9 Starting Date: 12/27/22   Objective:   PHYSICAL EXAM: Blood pressure 113/67, pulse 74, temperature 97.9 F (36.6 C), height 5\' 6"  (1.676 m), weight 225 lb (102.1 kg), SpO2 97%. Body mass index is 36.32 kg/m.  General: Well Developed, well nourished, and in no acute distress.  HEENT: Normocephalic, atraumatic Skin: Warm and dry, cap RF less 2 sec, good turgor Chest:  Normal excursion, shape, no gross abn Respiratory: speaking in  full sentences, no conversational dyspnea NeuroM-Sk: Ambulates w/o assistance, moves * 4 Psych: A and O *3, insight good, mood-full  DIAGNOSTIC DATA REVIEWED:  BMET    Component Value Date/Time   NA 142 12/27/2022 1013   K 4.5 12/27/2022 1013   CL 102 12/27/2022 1013   CO2 23 12/27/2022 1013   GLUCOSE 122 (H) 12/27/2022 1013   GLUCOSE 155 (H) 08/17/2022 1016   BUN 16 12/27/2022 1013   CREATININE 0.78 12/27/2022 1013   CALCIUM 9.9 12/27/2022 1013   GFRNONAA >60 08/17/2022 1016   GFRAA 113 03/23/2020 1439   Lab Results  Component Value Date   HGBA1C 6.3 (H) 04/10/2023   HGBA1C 6.2 11/30/2016   Lab Results  Component Value Date   INSULIN 15.9 12/27/2022   INSULIN 8.7 03/23/2020   Lab Results   Component Value Date   TSH 1.680 12/27/2022   CBC    Component Value Date/Time   WBC 7.0 12/27/2022 1013   WBC 6.8 08/17/2022 1016   RBC 4.91 12/27/2022 1013   RBC 4.59 08/17/2022 1016   HGB 14.0 12/27/2022 1013   HGB 12.5 08/09/2015 1020   HCT 43.0 12/27/2022 1013   HCT 37.8 08/09/2015 1020   PLT 238 12/27/2022 1013   MCV 88 12/27/2022 1013   MCV 86 08/09/2015 1020   MCH 28.5 12/27/2022 1013   MCH 27.9 08/17/2022 1016   MCHC 32.6 12/27/2022 1013   MCHC 33.1 08/17/2022 1016   RDW 13.6 12/27/2022 1013   RDW 14.3 08/09/2015 1020   Iron Studies No results found for: "IRON", "TIBC", "FERRITIN", "IRONPCTSAT" Lipid Panel     Component Value Date/Time   CHOL 165 12/27/2022 1013   TRIG 150 (H) 12/27/2022 1013   HDL 69 12/27/2022 1013   CHOLHDL 2.5 08/09/2022 0934   CHOLHDL 4 01/22/2020 0906   VLDL 12.4 01/22/2020 0906   LDLCALC 71 12/27/2022 1013   Hepatic Function Panel     Component Value Date/Time   PROT 7.5 12/27/2022 1013   ALBUMIN 4.7 12/27/2022 1013   AST 21 12/27/2022 1013   ALT 23 12/27/2022 1013   ALKPHOS 97 12/27/2022 1013   BILITOT 0.3 12/27/2022 1013      Component Value Date/Time   TSH 1.680 12/27/2022 1013   Nutritional Lab Results  Component Value Date   VD25OH 76.9 04/10/2023   VD25OH 27.4 (L) 12/27/2022   VD25OH 30.2 08/09/2022    Attestations:   I, Special Puri , acting as a Stage manager for Thomasene Lot, DO., have compiled all relevant documentation for today's office visit on behalf of Thomasene Lot, DO, while in the presence of [employer], DO.  I have reviewed the above documentation for accuracy and completeness, and I agree with the above. Maria Glenn, D.O.  The 21st Century Cures Act was signed into law in 2016 which includes the topic of electronic health records.  This provides immediate access to information in MyChart.  This includes consultation notes, operative notes, office notes, lab results and pathology  reports.  If you have any questions about what you read please let us know at your next visit so we can discuss your concerns and take corrective action if need be.  We are right here with you.

## 2023-05-15 ENCOUNTER — Ambulatory Visit (INDEPENDENT_AMBULATORY_CARE_PROVIDER_SITE_OTHER): Payer: 59 | Admitting: Family Medicine

## 2023-05-15 ENCOUNTER — Encounter (INDEPENDENT_AMBULATORY_CARE_PROVIDER_SITE_OTHER): Payer: Self-pay | Admitting: Family Medicine

## 2023-05-15 VITALS — BP 116/74 | HR 84 | Temp 98.0°F | Ht 65.0 in | Wt 218.0 lb

## 2023-05-15 DIAGNOSIS — Z6837 Body mass index (BMI) 37.0-37.9, adult: Secondary | ICD-10-CM

## 2023-05-15 DIAGNOSIS — Z6836 Body mass index (BMI) 36.0-36.9, adult: Secondary | ICD-10-CM | POA: Diagnosis not present

## 2023-05-15 DIAGNOSIS — E669 Obesity, unspecified: Secondary | ICD-10-CM

## 2023-05-15 DIAGNOSIS — Z7984 Long term (current) use of oral hypoglycemic drugs: Secondary | ICD-10-CM

## 2023-05-15 DIAGNOSIS — E1169 Type 2 diabetes mellitus with other specified complication: Secondary | ICD-10-CM | POA: Diagnosis not present

## 2023-05-15 DIAGNOSIS — E559 Vitamin D deficiency, unspecified: Secondary | ICD-10-CM

## 2023-05-15 DIAGNOSIS — Z7985 Long-term (current) use of injectable non-insulin antidiabetic drugs: Secondary | ICD-10-CM

## 2023-05-15 MED ORDER — METFORMIN HCL 500 MG PO TABS
ORAL_TABLET | ORAL | Status: AC
Start: 2023-05-15 — End: ?

## 2023-05-15 MED ORDER — TIRZEPATIDE 5 MG/0.5ML ~~LOC~~ SOAJ
5.0000 mg | SUBCUTANEOUS | 0 refills | Status: DC
Start: 2023-05-15 — End: 2023-06-04

## 2023-05-15 NOTE — Progress Notes (Signed)
Maria Glenn, D.O.  ABFM, ABOM Specializing in Clinical Bariatric Medicine  Office located at: [address] [place], [place]  [address]     Assessment and Plan:   Medications Discontinued During This Encounter  Medication Reason   tirzepatide [facility]) 2.5 MG/0.5ML Pen Dose change   metFORMIN (GLUCOPHAGE) 500 MG tablet      Meds ordered this encounter  Medications   metFORMIN (GLUCOPHAGE) 500 MG tablet    Sig: 1/2 po with lunch and dinner daily    30 d supply;  ** OV for RF **   Do not send RF request   tirzepatide (MOUNJARO) 5 MG/0.5ML Pen    Sig: Inject 5 mg into the skin once a week.    Dispense:  2 mL    Refill:  0     Type 2 diabetes mellitus with obesity (HCC) Assessment & Plan: Her DM is treated with Metformin 500 mg 1/2 po with lunch and dinner & Mounjaro 2.5 mg once wkly. Started the [place] two wks ago. Denies any constipation. Reports feeling nauseous the first few days after beginning the [facility] - symptoms have resolved, tolerating well now. Does not feel that the [place] Keen is making it more difficult to eat the foods on her meal plan. She notes having a decrease in cravings. She checked her fasting blood sugar on two occasions since last OV: 88 & 95.  Lab Results  Component Value Date   HGBA1C 6.3 (H) 04/10/2023   HGBA1C 7.3 (H) 12/27/2022   HGBA1C 7.1 (H) 08/09/2022   INSULIN 15.9 12/27/2022   INSULIN 8.3 03/14/2021   INSULIN 13.0 12/07/2020    Will refill Metformin today. To further aide with her diabetes and weight loss efforts, pt has has been instructed to increase Mounjaro to 5 mg. Continue her prudent nutritional plan that is low in simple carbohydrates, saturated fats and trans fats to goal of 5-10% weight loss to achieve significant health benefits.    Vitamin D deficiency Assessment & Plan: Condition treated with ERGO 50 K international units every 10 days, tolerating well.   Lab Results  Component Value Date   VD25OH 76.9  04/10/2023   VD25OH 27.4 (L) 12/27/2022   VD25OH 30.2 08/09/2022   Continue with their weight loss efforts & ERGO at current dose. Pt denies need for refill. Will continue to monitor condition.    TREATMENT PLAN FOR OBESITY: BMI 37.0-37.9, adult- current BMI 36.28 Morbid obesity-start bmi 40.7/date4/3/24 Assessment & Plan: Maria Glenn is here to discuss her progress with her obesity treatment plan along with follow-up of her obesity related diagnoses. See Medical Weight Management Flowsheet for complete bioelectrical impedance results.  Since last office visit on 04/26/23 patient's muscle mass has increased by 2.8 lb. Fat mass has decreased by 9.6 lb. Total body water has decreased by 2.4 lb.  Counseling done on how various foods will affect these numbers and how to maximize success  Total lbs lost to date: 34 lbs  Total weight loss percentage to date: 13.48%   No change to meal plan - see Subjective  Behavioral Intervention Additional resources provided today: patient declined Evidence-based interventions for health behavior change were utilized today including the discussion of self monitoring techniques, problem-solving barriers and SMART goal setting techniques.   Regarding patient's less desirable eating habits and patterns, we employed the technique of small changes.  Pt will specifically work on: continuing her exercise regiment and consistently drinking 100 ounces of water daily  for  next visit.    FOLLOW UP: Return in about 20 days (around 06/04/2023). She was informed of the importance of frequent follow up visits to maximize her success with intensive lifestyle modifications for her multiple health conditions.  Subjective:   Chief complaint: Obesity Maria Glenn is here to discuss her progress with her obesity treatment plan. She is on the Category 2 Plan and keeping a food journal and adhering to recommended goals of 1200-1300 calories and 90+ protein and states she is following  her eating plan approximately 90% of the time. She states she is exercising on the  treadmill 30 minutes 4-5 days per week.  Interval History:  Maria Glenn is here for a follow up office visit. Since last OV, Maria Glenn has been doing well. Obtained and started Mounjaro 2.5 mg and is tolerating pretty well. Notes decrease in cravings. Has been more diligent about increasing protein and decreasing carb intake. Drinks roughly 90-100 ounces of water daily. No complaints with meal plan.   Pharmacotherapy for weight loss: She is currently taking  Mounjaro 2.5 mg once wkly and Metformin 500 mg 1/2 po with lunch and dinner daily   for medical weight loss.  Denies side effects.    Review of Systems:  Pertinent positives were addressed with patient today.  Reviewed by clinician on day of visit: allergies, medications, problem list, medical history, surgical history, family history, social history, and previous encounter notes.  Weight Summary and Biometrics   Weight Lost Since Last Visit: 7lb  Weight Gained Since Last Visit: 0lb   Vitals Temp: 98 F (36.7 C) BP: 116/74 Pulse Rate: 84 SpO2: 97 %   Anthropometric Measurements Height: 5\' 5"  (1.651 m) Weight: 218 lb (98.9 kg) BMI (Calculated): 36.28 Weight at Last Visit: 225lb Weight Lost Since Last Visit: 7lb Weight Gained Since Last Visit: 0lb Starting Weight: 252lb Total Weight Loss (lbs): 34 lb (15.4 kg) Peak Weight: 252lb   Body Composition  Body Fat %: 37.9 % Fat Mass (lbs): 82.8 lbs Muscle Mass (lbs): 129 lbs Total Body Water (lbs): 80.2 lbs Visceral Fat Rating : 10   Other Clinical Data Fasting: no Labs: no Today's Visit #: 10 Starting Date: 12/27/22   Objective:   PHYSICAL EXAM: Blood pressure 116/74, pulse 84, temperature 98 F (36.7 C), height 5\' 5"  (1.651 m), weight 218 lb (98.9 kg), SpO2 97%. Body mass index is 36.28 kg/m.  General: Well Developed, well nourished, and in no acute distress.  HEENT:  Normocephalic, atraumatic Skin: Warm and dry, cap RF less 2 sec, good turgor Chest:  Normal excursion, shape, no gross abn Respiratory: speaking in full sentences, no conversational dyspnea NeuroM-Sk: Ambulates w/o assistance, moves * 4 Psych: A and O *3, insight good, mood-full  DIAGNOSTIC DATA REVIEWED:  BMET    Component Value Date/Time   NA 142 12/27/2022 1013   K 4.5 12/27/2022 1013   CL 102 12/27/2022 1013   CO2 23 12/27/2022 1013   GLUCOSE 122 (H) 12/27/2022 1013   GLUCOSE 155 (H) 08/17/2022 1016   BUN 16 12/27/2022 1013   CREATININE 0.78 12/27/2022 1013   CALCIUM 9.9 12/27/2022 1013   GFRNONAA >60 08/17/2022 1016   GFRAA 113 03/23/2020 1439   Lab Results  Component Value Date   HGBA1C 6.3 (H) 04/10/2023   HGBA1C 6.2 11/30/2016   Lab Results  Component Value Date   INSULIN 15.9 12/27/2022   INSULIN 8.7 03/23/2020   Lab Results  Component Value Date   TSH 1.680 12/27/2022  CBC    Component Value Date/Time   WBC 7.0 12/27/2022 1013   WBC 6.8 08/17/2022 1016   RBC 4.91 12/27/2022 1013   RBC 4.59 08/17/2022 1016   HGB 14.0 12/27/2022 1013   HGB 12.5 08/09/2015 1020   HCT 43.0 12/27/2022 1013   HCT 37.8 08/09/2015 1020   PLT 238 12/27/2022 1013   MCV 88 12/27/2022 1013   MCV 86 08/09/2015 1020   MCH 28.5 12/27/2022 1013   MCH 27.9 08/17/2022 1016   MCHC 32.6 12/27/2022 1013   MCHC 33.1 08/17/2022 1016   RDW 13.6 12/27/2022 1013   RDW 14.3 08/09/2015 1020   Iron Studies No results found for: "IRON", "TIBC", "FERRITIN", "IRONPCTSAT" Lipid Panel     Component Value Date/Time   CHOL 165 12/27/2022 1013   TRIG 150 (H) 12/27/2022 1013   HDL 69 12/27/2022 1013   CHOLHDL 2.5 08/09/2022 0934   CHOLHDL 4 01/22/2020 0906   VLDL 12.4 01/22/2020 0906   LDLCALC 71 12/27/2022 1013   Hepatic Function Panel     Component Value Date/Time   PROT 7.5 12/27/2022 1013   ALBUMIN 4.7 12/27/2022 1013   AST 21 12/27/2022 1013   ALT 23 12/27/2022 1013   ALKPHOS  97 12/27/2022 1013   BILITOT 0.3 12/27/2022 1013      Component Value Date/Time   TSH 1.680 12/27/2022 1013   Nutritional Lab Results  Component Value Date   VD25OH 76.9 04/10/2023   VD25OH 27.4 (L) 12/27/2022   VD25OH 30.2 08/09/2022    Attestations:   I, Special Puri, acting as a Stage manager for Thomasene Lot, DO., have compiled all relevant documentation for today's office visit on behalf of Thomasene Lot, DO, while in the presence of [employer], DO.  I have reviewed the above documentation for accuracy and completeness, and I agree with the above. Maria Glenn, D.O.  The 21st Century Cures Act was signed into law in 2016 which includes the topic of electronic health records.  This provides immediate access to information in MyChart.  This includes consultation notes, operative notes, office notes, lab results and pathology reports.  If you have any questions about what you read please let us know at your next visit so we can discuss your concerns and take corrective action if need be.  We are right here with you.

## 2023-06-04 ENCOUNTER — Encounter (INDEPENDENT_AMBULATORY_CARE_PROVIDER_SITE_OTHER): Payer: Self-pay | Admitting: Family Medicine

## 2023-06-04 ENCOUNTER — Ambulatory Visit (INDEPENDENT_AMBULATORY_CARE_PROVIDER_SITE_OTHER): Payer: 59 | Admitting: Family Medicine

## 2023-06-04 VITALS — BP 116/78 | HR 72 | Temp 98.6°F | Ht 65.0 in | Wt 211.0 lb

## 2023-06-04 DIAGNOSIS — E1169 Type 2 diabetes mellitus with other specified complication: Secondary | ICD-10-CM

## 2023-06-04 DIAGNOSIS — Z6837 Body mass index (BMI) 37.0-37.9, adult: Secondary | ICD-10-CM

## 2023-06-04 DIAGNOSIS — E559 Vitamin D deficiency, unspecified: Secondary | ICD-10-CM

## 2023-06-04 DIAGNOSIS — Z7985 Long-term (current) use of injectable non-insulin antidiabetic drugs: Secondary | ICD-10-CM

## 2023-06-04 DIAGNOSIS — Z6835 Body mass index (BMI) 35.0-35.9, adult: Secondary | ICD-10-CM

## 2023-06-04 MED ORDER — VITAMIN D (ERGOCALCIFEROL) 1.25 MG (50000 UNIT) PO CAPS: ORAL_CAPSULE | ORAL | 0 refills | Status: DC

## 2023-06-04 MED ORDER — TIRZEPATIDE 5 MG/0.5ML ~~LOC~~ SOAJ
5.0000 mg | SUBCUTANEOUS | 0 refills | Status: DC
Start: 2023-06-04 — End: 2023-07-16

## 2023-06-04 NOTE — Progress Notes (Signed)
Maria Glenn, D.O.  ABFM, ABOM Specializing in Clinical Bariatric Medicine  Office located at: [address] [place], [place]  [address]     Assessment and Plan:   Medications Discontinued During This Encounter  Medication Reason   Vitamin D, Ergocalciferol, (DRISDOL) 1.25 MG (50000 UNIT) CAPS capsule Reorder   tirzepatide Maria Glenn) 5 MG/0.5ML Pen Reorder    Meds ordered this encounter  Medications   Vitamin D, Ergocalciferol, (DRISDOL) 1.25 MG (50000 UNIT) CAPS capsule    Sig: 1 po q 10 days    Dispense:  3 capsule    Refill:  0   tirzepatide (MOUNJARO) 5 MG/0.5ML Pen    Sig: Inject 5 mg into the skin once a week.    Dispense:  2 mL    Refill:  0    Type 2 diabetes mellitus with obesity (HCC) Assessment: Condition is Not at goal.. She has been complaint with taking Mounajro 5mg . She notes that her first shot made her very nauseous but she is now tolerating this well since her increased dose. She feels as it wears off about 6 days after injection. Her blood sugar was 104 this morning and she averages in the 90's-100's. She endorses being complaint and tolerant with Metformin and denies any GI upset or N/V/D.  Lab Results  Component Value Date   HGBA1C 6.3 (H) 04/10/2023   HGBA1C 7.3 (H) 12/27/2022   HGBA1C 7.1 (H) 08/09/2022   INSULIN 15.9 12/27/2022   INSULIN 8.3 03/14/2021   INSULIN 13.0 12/07/2020    Plan:- Continue Mounjaro at current dose. She denies need for dose increase. I will refill today.   - Continue Metformin 500mg  half at lunch and dinner. She denies need for refill.   - Continue to regularly check her blood sugar.   - I will recheck labs in 3 months if not done at Endo provider / PCP.    Vitamin D deficiency Assessment: Condition is At goal. Her vitamin D level is within the recommended range. She continues ERGO and has been tolerating this well.  Lab Results  Component Value Date   VD25OH 76.9 04/10/2023   VD25OH 27.4 (L) 12/27/2022    VD25OH 30.2 08/09/2022   Plan: - Continue Ergocalciferol 50K IU as directed and she was encouraged to continue to take the medicine until told otherwise. I will refill today.   - weight loss will likely improve availability of vitamin D, thus encouraged Amree to continue with meal plan and their weight loss efforts to further improve this condition.  We will continue to monitor levels regularly (every 3-4 mo on average) to keep levels within normal limits and prevent over supplementation.   TREATMENT PLAN FOR OBESITY: BMI 37.0-37.9, adult- current BMI 35.11 Morbid obesity-start bmi 40.7/date4/3/24 Assessment:  Maria Glenn is here to discuss her progress with her obesity treatment plan along with follow-up of her obesity related diagnoses. See Medical Weight Management Flowsheet for complete bioelectrical impedance results.  Condition is not optimized. Biometric data collected today, was reviewed with patient.   Since last office visit on 05/15/2023 patient's  Muscle mass has decreased by 12.2lb. Fat mass has increased by 5.8lb. Total body water has decreased by 3.4lb.  Counseling done on how various foods will affect these numbers and how to maximize success  Total lbs lost to date: 34 Total weight loss percentage to date: 13.49   Plan: - Maria Glenn will work on healthier eating habits and continue to follow the Category 2 Plan  and keeping a food journal and adhering to recommended goals of 1200-1300 calories and 90+ protein best they can.   - Bring in her food journaling log next OV  Behavioral Intervention Additional resources provided today:  food journaling log Evidence-based interventions for health behavior change were utilized today including the discussion of self monitoring techniques, problem-solving barriers and SMART goal setting techniques.   Regarding patient's less desirable eating habits and patterns, we employed the technique of small changes.  Pt will specifically work  on: Continue regular treadmill exercise and add strength training for 2 days a week minimum for next visit.     She has agreed to Continue current level of physical activity    FOLLOW UP: Return in about 3 weeks (around 06/25/2023).  She was informed of the importance of frequent follow up visits to maximize her success with intensive lifestyle modifications for her multiple health conditions.  Subjective:   Chief complaint: Obesity Maria Glenn is here to discuss her progress with her obesity treatment plan. She is on the the Category 2 Plan and keeping a food journal and adhering to recommended goals of 1200-1300 calories and 90+ protein and states she is following her eating plan approximately 90% of the time. She states she is treadmill 45 minutes 5 days per week.  Interval History:  Maria Glenn is here for a follow up office visit.     Since last office visit:  She has been well and notes hitting her protein goals most days but not 100% of the time. She has been complaint with journaling her food intake but not 100% either she will note journaling her good and bad days. She has tried not to go out to eat as often as before and endorses skipping mels occasionally. She notes eating some light mayo occasionally and foods with sauces and dips.    Pharmacotherapy for weight loss: She is currently taking  Metformin and Mounjaro  for medical weight loss.  Denies side effects.    Review of Systems:  Pertinent positives were addressed with patient today.  Reviewed by clinician on day of visit: allergies, medications, problem list, medical history, surgical history, family history, social history, and previous encounter notes.  Weight Summary and Biometrics   Weight Lost Since Last Visit: 7lb  Weight Gained Since Last Visit: 0lb   Vitals Temp: 98.6 F (37 C) BP: 116/78 Pulse Rate: 72 SpO2: 98 %   Anthropometric Measurements Height: 5\' 5"  (1.651 m) Weight: 211 lb (95.7 kg) BMI  (Calculated): 35.11 Weight at Last Visit: 218lb Weight Lost Since Last Visit: 7lb Weight Gained Since Last Visit: 0lb Starting Weight: 252lb Total Weight Loss (lbs): 41 lb (18.6 kg) Peak Weight: 252lb   Body Composition  Body Fat %: 41.9 % Fat Mass (lbs): 88.6 lbs Muscle Mass (lbs): 116.8 lbs Total Body Water (lbs): 76.8 lbs Visceral Fat Rating : 11   Other Clinical Data Fasting: no Labs: no Today's Visit #: 11 Starting Date: 12/27/22     Objective:   PHYSICAL EXAM: Blood pressure 116/78, pulse 72, temperature 98.6 F (37 C), height 5\' 5"  (1.651 m), weight 211 lb (95.7 kg), SpO2 98%. Body mass index is 35.11 kg/m.  General: Well Developed, well nourished, and in no acute distress.  HEENT: Normocephalic, atraumatic Skin: Warm and dry, cap RF less 2 sec, good turgor Chest:  Normal excursion, shape, no gross abn Respiratory: speaking in full sentences, no conversational dyspnea NeuroM-Sk: Ambulates w/o assistance, moves * 4 Psych: A  and O *3, insight good, mood-full  DIAGNOSTIC DATA REVIEWED:  BMET    Component Value Date/Time   NA 142 12/27/2022 1013   K 4.5 12/27/2022 1013   CL 102 12/27/2022 1013   CO2 23 12/27/2022 1013   GLUCOSE 122 (H) 12/27/2022 1013   GLUCOSE 155 (H) 08/17/2022 1016   BUN 16 12/27/2022 1013   CREATININE 0.78 12/27/2022 1013   CALCIUM 9.9 12/27/2022 1013   GFRNONAA >60 08/17/2022 1016   GFRAA 113 03/23/2020 1439   Lab Results  Component Value Date   HGBA1C 6.3 (H) 04/10/2023   HGBA1C 6.2 11/30/2016   Lab Results  Component Value Date   INSULIN 15.9 12/27/2022   INSULIN 8.7 03/23/2020   Lab Results  Component Value Date   TSH 1.680 12/27/2022   CBC    Component Value Date/Time   WBC 7.0 12/27/2022 1013   WBC 6.8 08/17/2022 1016   RBC 4.91 12/27/2022 1013   RBC 4.59 08/17/2022 1016   HGB 14.0 12/27/2022 1013   HGB 12.5 08/09/2015 1020   HCT 43.0 12/27/2022 1013   HCT 37.8 08/09/2015 1020   PLT 238 12/27/2022 1013    MCV 88 12/27/2022 1013   MCV 86 08/09/2015 1020   MCH 28.5 12/27/2022 1013   MCH 27.9 08/17/2022 1016   MCHC 32.6 12/27/2022 1013   MCHC 33.1 08/17/2022 1016   RDW 13.6 12/27/2022 1013   RDW 14.3 08/09/2015 1020   Iron Studies No results found for: "IRON", "TIBC", "FERRITIN", "IRONPCTSAT" Lipid Panel     Component Value Date/Time   CHOL 165 12/27/2022 1013   TRIG 150 (H) 12/27/2022 1013   HDL 69 12/27/2022 1013   CHOLHDL 2.5 08/09/2022 0934   CHOLHDL 4 01/22/2020 0906   VLDL 12.4 01/22/2020 0906   LDLCALC 71 12/27/2022 1013   Hepatic Function Panel     Component Value Date/Time   PROT 7.5 12/27/2022 1013   ALBUMIN 4.7 12/27/2022 1013   AST 21 12/27/2022 1013   ALT 23 12/27/2022 1013   ALKPHOS 97 12/27/2022 1013   BILITOT 0.3 12/27/2022 1013      Component Value Date/Time   TSH 1.680 12/27/2022 1013   Nutritional Lab Results  Component Value Date   VD25OH 76.9 04/10/2023   VD25OH 27.4 (L) 12/27/2022   VD25OH 30.2 08/09/2022    Attestations:   I, Clinical biochemist, acting as a Stage manager for [employer], DO., have compiled all relevant documentation for today's office visit on behalf of Thomasene Lot, DO, while in the presence of [employer], DO.  I have reviewed the above documentation for accuracy and completeness, and I agree with the above. Maria Glenn, D.O.  The 21st Century Cures Act was signed into law in 2016 which includes the topic of electronic health records.  This provides immediate access to information in MyChart.  This includes consultation notes, operative notes, office notes, lab results and pathology reports.  If you have any questions about what you read please let us know at your next visit so we can discuss your concerns and take corrective action if need be.  We are right here with you.

## 2023-06-05 ENCOUNTER — Telehealth (HOSPITAL_BASED_OUTPATIENT_CLINIC_OR_DEPARTMENT_OTHER): Payer: Self-pay | Admitting: *Deleted

## 2023-06-05 NOTE — Telephone Encounter (Signed)
LVM to see if patient has had diabetic eye exam this year

## 2023-06-13 LAB — HM DIABETES EYE EXAM

## 2023-06-25 ENCOUNTER — Ambulatory Visit (INDEPENDENT_AMBULATORY_CARE_PROVIDER_SITE_OTHER): Payer: 59 | Admitting: Family Medicine

## 2023-06-25 ENCOUNTER — Encounter (INDEPENDENT_AMBULATORY_CARE_PROVIDER_SITE_OTHER): Payer: Self-pay | Admitting: Family Medicine

## 2023-06-25 VITALS — BP 107/72 | HR 81 | Temp 98.4°F | Ht 65.0 in | Wt 204.0 lb

## 2023-06-25 DIAGNOSIS — Z7984 Long term (current) use of oral hypoglycemic drugs: Secondary | ICD-10-CM

## 2023-06-25 DIAGNOSIS — E538 Deficiency of other specified B group vitamins: Secondary | ICD-10-CM

## 2023-06-25 DIAGNOSIS — Z7985 Long-term (current) use of injectable non-insulin antidiabetic drugs: Secondary | ICD-10-CM

## 2023-06-25 DIAGNOSIS — E559 Vitamin D deficiency, unspecified: Secondary | ICD-10-CM

## 2023-06-25 DIAGNOSIS — E1169 Type 2 diabetes mellitus with other specified complication: Secondary | ICD-10-CM | POA: Diagnosis not present

## 2023-06-25 DIAGNOSIS — Z6833 Body mass index (BMI) 33.0-33.9, adult: Secondary | ICD-10-CM

## 2023-06-25 MED ORDER — METFORMIN HCL 500 MG PO TABS
ORAL_TABLET | ORAL | 0 refills | Status: DC
Start: 2023-06-25 — End: 2023-08-06

## 2023-06-25 NOTE — Progress Notes (Signed)
Maria Glenn, D.O.  ABFM, ABOM Specializing in Clinical Bariatric Medicine  Office located at: [address] [place], [place]  [address]     Assessment and Plan:   Medications Discontinued During This Encounter  Medication Reason   metFORMIN (GLUCOPHAGE) 500 MG tablet Reorder    Meds ordered this encounter  Medications   metFORMIN (GLUCOPHAGE) 500 MG tablet    Sig: 1/2 po with lunch and dinner daily    Dispense:  30 tablet    Refill:  0    30 d supply;  ** OV for RF **   Do not send RF request     Vitamin D deficiency Assessment: Condition is Controlled. This is being well controlled with Ergocalciferol 50K IU weekly and tolerates this well. She denies any adverse side effects on this medication.  Lab Results  Component Value Date   VD25OH 76.9 04/10/2023   VD25OH 27.4 (L) 12/27/2022   VD25OH 30.2 08/09/2022   Plan: - Continue ERGO at current dose every 10 days.   - We will continue to monitor levels regularly (every 3-4 mo on average) to keep levels within normal limits and prevent over supplementation. I will recheck early November.    Type 2 diabetes mellitus with obesity (HCC) Assessment: Condition is Not at goal.. Pt endorses feeling nauseous after her Mounarjo injection q fridays. She is still able to eat on plan and endorses nausea when exerting herself too much. This improves after Sunday. This is her 5th week on the 5mg . She however, does endorse the med quieting the food noise. She continues Metformin mainly 1 po with dinner and not lunch; she tolerates this well without difficulty and denies any GI upset.  Lab Results  Component Value Date   HGBA1C 6.3 (H) 04/10/2023   HGBA1C 7.3 (H) 12/27/2022   HGBA1C 7.1 (H) 08/09/2022   INSULIN 15.9 12/27/2022   INSULIN 8.3 03/14/2021   INSULIN 13.0 12/07/2020    Plan:- Continue Mounjaro and Metformin at current dose as directed. I will refill Metformin.   - Continue her prudent nutritional plan that is low  in simple carbohydrates, higher in protein and continue wt loss.   Pt encouraged to continually advance exercise and cardiovascular fitness as tolerated throughout weight loss journey.   - she is seeing pcp soon for labs, declines today.    With being on the metformin we recommend she discuss getting a BMP and b12 & magnesium done by her PCP d/t metformin use  - Importance of f/up with PCP and all other specialists, as scheduled    B12 deficiency Assessment: Condition is Controlled./ at goal.  She endorses taking her B12 supplement every day OTC consistently and denies any adverse side effects on this medication.  Lab Results  Component Value Date   VITAMINB12 1,186 04/10/2023   Plan: - Continue OTC B12 daily.   - Continue food intake rich in B12 such as dark leafy greens and lean meats and fish.     TREATMENT PLAN FOR OBESITY: BMI 33.0-33.9,adult- current 33.95 Morbid obesity-start bmi 40.7/date4/3/24 Assessment:  Maria Glenn is here to discuss her progress with her obesity treatment plan along with follow-up of her obesity related diagnoses. See Medical Weight Management Flowsheet for complete bioelectrical impedance results.  Condition is not optimized. Biometric data collected today, was reviewed with patient.   Since last office visit on 06/04/2023 patient's  Muscle mass has decreased by 0.8lb. Fat mass has decreased by 6lb. Total body  water has increased by 0.2lb.  Counseling done on how various foods will affect these numbers and how to maximize success  Total lbs lost to date: 48 Total weight loss percentage to date: 19.05%  Plan: - Continue to follow the Category 2 Plan and keeping a food journal and adhering to recommended goals of 1200-1300 calories and 90+ protein.  Behavioral Intervention Additional resources provided today: patient declined Evidence-based interventions for health behavior change were utilized today including the discussion of self  monitoring techniques, problem-solving barriers and SMART goal setting techniques.   Regarding patient's less desirable eating habits and patterns, we employed the technique of small changes.  Pt will specifically work on: Continue to journal and begin going to the gym for next visit.     She has agreed to Continue current level of physical activity    FOLLOW UP: Return in about 3 weeks (around 07/16/2023).  She was informed of the importance of frequent follow up visits to maximize her success with intensive lifestyle modifications for her multiple health conditions.  Subjective:   Chief complaint: Obesity [name] is here to discuss her progress with her obesity treatment plan. She is on the the Category 2 Plan and keeping a food journal and adhering to recommended goals of 1200-1300 calories and 90+ protein and states she is following her eating plan approximately 95% of the time. She states she is using the treadmill 45 minutes 5 days per week and strength training 20 minutes 2 days per week.  Interval History:  Maria Glenn is here for a follow up office visit.     Since last office visit:  She has been well and notices being slimmer in the face and upper body. She has been consistently going to the gym and will start some gym classes. She notes eating the same thing everyday and is not bored of the meal plan yet. She endorses increasing her protein and water intake.    We reviewed her meal plan and all questions were answered.   Pharmacotherapy for weight loss: She is currently taking  Metformin and Mounjaro  for medical weight loss.  Denies side effects.    Review of Systems:  Pertinent positives were addressed with patient today.  Reviewed by clinician on day of visit: allergies, medications, problem list, medical history, surgical history, family history, social history, and previous encounter notes.  Weight Summary and Biometrics   Weight Lost Since Last Visit:  7lb  Weight Gained Since Last Visit: 0lb   Vitals Temp: 98.4 F (36.9 C) BP: 107/72 Pulse Rate: 81 SpO2: 99 %   Anthropometric Measurements Height: 5\' 5"  (1.651 m) Weight: 204 lb (92.5 kg) BMI (Calculated): 33.95 Weight at Last Visit: 211lb Weight Lost Since Last Visit: 7lb Weight Gained Since Last Visit: 0lb Starting Weight: 252lb Total Weight Loss (lbs): 48 lb (21.8 kg) Peak Weight: 252lb   Body Composition  Body Fat %: 40.3 % Fat Mass (lbs): 82.6 lbs Muscle Mass (lbs): 116 lbs Total Body Water (lbs): 77 lbs Visceral Fat Rating : 11   Other Clinical Data Fasting: no Labs: no Today's Visit #: 12 Starting Date: 12/27/22     Objective:   PHYSICAL EXAM: Blood pressure 107/72, pulse 81, temperature 98.4 F (36.9 C), height 5\' 5"  (1.651 m), weight 204 lb (92.5 kg), SpO2 99%. Body mass index is 33.95 kg/m.  General: Well Developed, well nourished, and in no acute distress.  HEENT: Normocephalic, atraumatic Skin: Warm and dry, cap RF less 2  sec, good turgor Chest:  Normal excursion, shape, no gross abn Respiratory: speaking in full sentences, no conversational dyspnea NeuroM-Sk: Ambulates w/o assistance, moves * 4 Psych: A and O *3, insight good, mood-full  DIAGNOSTIC DATA REVIEWED:  BMET    Component Value Date/Time   NA 142 12/27/2022 1013   K 4.5 12/27/2022 1013   CL 102 12/27/2022 1013   CO2 23 12/27/2022 1013   GLUCOSE 122 (H) 12/27/2022 1013   GLUCOSE 155 (H) 08/17/2022 1016   BUN 16 12/27/2022 1013   CREATININE 0.78 12/27/2022 1013   CALCIUM 9.9 12/27/2022 1013   GFRNONAA >60 08/17/2022 1016   GFRAA 113 03/23/2020 1439   Lab Results  Component Value Date   HGBA1C 6.3 (H) 04/10/2023   HGBA1C 6.2 11/30/2016   Lab Results  Component Value Date   INSULIN 15.9 12/27/2022   INSULIN 8.7 03/23/2020   Lab Results  Component Value Date   TSH 1.680 12/27/2022   CBC    Component Value Date/Time   WBC 7.0 12/27/2022 1013   WBC 6.8  08/17/2022 1016   RBC 4.91 12/27/2022 1013   RBC 4.59 08/17/2022 1016   HGB 14.0 12/27/2022 1013   HGB 12.5 08/09/2015 1020   HCT 43.0 12/27/2022 1013   HCT 37.8 08/09/2015 1020   PLT 238 12/27/2022 1013   MCV 88 12/27/2022 1013   MCV 86 08/09/2015 1020   MCH 28.5 12/27/2022 1013   MCH 27.9 08/17/2022 1016   MCHC 32.6 12/27/2022 1013   MCHC 33.1 08/17/2022 1016   RDW 13.6 12/27/2022 1013   RDW 14.3 08/09/2015 1020   Iron Studies No results found for: "IRON", "TIBC", "FERRITIN", "IRONPCTSAT" Lipid Panel     Component Value Date/Time   CHOL 165 12/27/2022 1013   TRIG 150 (H) 12/27/2022 1013   HDL 69 12/27/2022 1013   CHOLHDL 2.5 08/09/2022 0934   CHOLHDL 4 01/22/2020 0906   VLDL 12.4 01/22/2020 0906   LDLCALC 71 12/27/2022 1013   Hepatic Function Panel     Component Value Date/Time   PROT 7.5 12/27/2022 1013   ALBUMIN 4.7 12/27/2022 1013   AST 21 12/27/2022 1013   ALT 23 12/27/2022 1013   ALKPHOS 97 12/27/2022 1013   BILITOT 0.3 12/27/2022 1013      Component Value Date/Time   TSH 1.680 12/27/2022 1013   Nutritional Lab Results  Component Value Date   VD25OH 76.9 04/10/2023   VD25OH 27.4 (L) 12/27/2022   VD25OH 30.2 08/09/2022    Attestations:   I, Clinical biochemist, acting as a Stage manager for [employer], DO., have compiled all relevant documentation for today's office visit on behalf of Thomasene Lot, DO, while in the presence of [employer], DO.  I have reviewed the above documentation for accuracy and completeness, and I agree with the above. Maria Glenn, D.O.  The 21st Century Cures Act was signed into law in 2016 which includes the topic of electronic health records.  This provides immediate access to information in MyChart.  This includes consultation notes, operative notes, office notes, lab results and pathology reports.  If you have any questions about what you read please let us know at your next visit so we can discuss your  concerns and take corrective action if need be.  We are right here with you.

## 2023-07-04 ENCOUNTER — Encounter (HOSPITAL_BASED_OUTPATIENT_CLINIC_OR_DEPARTMENT_OTHER): Payer: Self-pay | Admitting: *Deleted

## 2023-07-05 ENCOUNTER — Encounter (HOSPITAL_BASED_OUTPATIENT_CLINIC_OR_DEPARTMENT_OTHER): Payer: Self-pay | Admitting: *Deleted

## 2023-07-05 LAB — HM PAP SMEAR: HM Pap smear: NEGATIVE

## 2023-07-05 LAB — HM MAMMOGRAPHY

## 2023-07-11 ENCOUNTER — Encounter (HOSPITAL_BASED_OUTPATIENT_CLINIC_OR_DEPARTMENT_OTHER): Payer: Self-pay | Admitting: Family Medicine

## 2023-07-11 ENCOUNTER — Ambulatory Visit (INDEPENDENT_AMBULATORY_CARE_PROVIDER_SITE_OTHER): Payer: 59 | Admitting: Family Medicine

## 2023-07-11 VITALS — BP 118/82 | HR 81 | Ht 66.0 in | Wt 203.4 lb

## 2023-07-11 DIAGNOSIS — Z23 Encounter for immunization: Secondary | ICD-10-CM | POA: Diagnosis not present

## 2023-07-11 DIAGNOSIS — Z0000 Encounter for general adult medical examination without abnormal findings: Secondary | ICD-10-CM | POA: Diagnosis not present

## 2023-07-11 DIAGNOSIS — E1169 Type 2 diabetes mellitus with other specified complication: Secondary | ICD-10-CM | POA: Diagnosis not present

## 2023-07-11 DIAGNOSIS — E559 Vitamin D deficiency, unspecified: Secondary | ICD-10-CM

## 2023-07-11 DIAGNOSIS — E538 Deficiency of other specified B group vitamins: Secondary | ICD-10-CM

## 2023-07-11 DIAGNOSIS — E669 Obesity, unspecified: Secondary | ICD-10-CM

## 2023-07-11 NOTE — Progress Notes (Signed)
Subjective:    CC: Annual Physical Exam  HPI: Maria Glenn is a 57 y.o. presenting for annual physical  I reviewed the past medical history, family history, social history, surgical history, and allergies today and no changes were needed.  Please see the problem list section below in epic for further details.  Past Medical History: Past Medical History:  Diagnosis Date   Allergy    At risk for dehydration 02/14/2021   B12 deficiency    BMI 38.0-38.9,adult 12/13/2020   Class 2 severe obesity with serious comorbidity and body mass index (BMI) of 39.0 to 39.9 in adult [facility]) 06/09/2020   Clotting disorder (HCC)    factor V Leiden   DVT (deep venous thrombosis) (HCC)    Factor V Leiden (HCC)    Fatty liver    History of blood clots    History of DVT (deep vein thrombosis) 08/09/2015   Hyperlipidemia    Hypothyroidism    Left leg DVT (HCC) 08/09/2015   Non-recurrent acute allergic otitis media of right ear 12/13/2020   Other fatigue 12/07/2020   Other hyperlipidemia 12/07/2020   Other specified hypothyroidism 12/07/2020   Prediabetes    Sinobronchitis 12/13/2020   SOBOE (shortness of breath on exertion)    SOBOE (shortness of breath on exertion) 12/07/2020   Thyroid disease    Trigeminal neuralgia    Visit for preventive health examination 11/30/2016   Vitamin D deficiency    Past Surgical History: Past Surgical History:  Procedure Laterality Date   CESAREAN SECTION     Social History: Social History   Socioeconomic History   Marital status: Married    Spouse name: Sundus Pete   Number of children: 1   Years of education: 12   Highest education level: Not on file  Occupational History   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  Tobacco Use   Smoking status: Never   Smokeless tobacco: Never  Vaping Use   Vaping status: Never Used  Substance and Sexual Activity   Alcohol use: No    Alcohol/week: 0.0 standard drinks of alcohol   Drug use: No   Sexual  activity: Yes    Birth control/protection: Other-see comments    Comment: husband had vasectomy  Other Topics Concern   Not on file  Social History Narrative   Right handed   Two story home   Drinks caffeine   Social Determinants of Health   Financial Resource Strain: Low Risk  (07/11/2023)   Overall Financial Resource Strain (CARDIA)    Difficulty of Paying Living Expenses: Not hard at all  Food Insecurity: No Food Insecurity (07/11/2023)   Hunger Vital Sign    Worried About Radiation protection practitioner of Food in the Last Year: Never true    Ran Out of Food in the Last Year: Never true  Transportation Needs: No Transportation Needs (07/11/2023)   PRAPARE - Administrator, Civil Service (Medical): No    Lack of Transportation (Non-Medical): No  Physical Activity: Sufficiently Active (07/11/2023)   Exercise Vital Sign    Days of Exercise per Week: 5 days    Minutes of Exercise per Session: 40 min  Stress: No Stress Concern Present (07/11/2023)   [employer] of Occupational Health - Occupational Stress Questionnaire    Feeling of Stress : Not at all  Social Connections: Moderately Isolated (07/11/2023)   Social Connection and Isolation Panel [NHANES]    Frequency of Communication with Friends and Family: More than three times a week  Frequency of Social Gatherings with Friends and Family: More than three times a week    Attends Religious Services: Never    Database administrator or Organizations: No    Attends Engineer, structural: Never    Marital Status: Married   Family History: Family History  Problem Relation Age of Onset   Hypertension Mother    Hyperlipidemia Mother    Cancer Father        Lung   Pancreatic cancer Sister    Colon cancer Neg Hx    Colon polyps Neg Hx    Esophageal cancer Neg Hx    Stomach cancer Neg Hx    Rectal cancer Neg Hx    Allergies: Allergies  Allergen Reactions   Penicillins Shortness Of Breath   Medications: See med  rec.  Review of Systems: No headache, visual changes, nausea, vomiting, diarrhea, constipation, dizziness, abdominal pain, skin rash, fevers, chills, night sweats, swollen lymph nodes, weight loss, chest pain, body aches, joint swelling, muscle aches, shortness of breath, mood changes, visual or auditory hallucinations.  Objective:    BP 118/82 (BP Location: Right Arm, Patient Position: Sitting, Cuff Size: Normal)   Pulse 81   Ht 5\' 6"  (1.676 m)   Wt 203 lb 6.4 oz (92.3 kg)   SpO2 97%   BMI 32.83 kg/m   General: Well Developed, well nourished, and in no acute distress.  Neuro: Alert and oriented x3, extra-ocular muscles intact, sensation grossly intact. Cranial nerves II through XII are intact, motor, sensory, and coordinative functions are all intact. HEENT: Normocephalic, atraumatic, pupils equal round reactive to light, neck supple, no masses, no lymphadenopathy, thyroid nonpalpable. Oropharynx, nasopharynx, external ear canals are unremarkable. Skin: Warm and dry, no rashes noted.  Cardiac: Regular rate and rhythm, no murmurs rubs or gallops.  Respiratory: Clear to auscultation bilaterally. Not using accessory muscles, speaking in full sentences.  Abdominal: Soft, nontender, nondistended, positive bowel sounds, no masses, no organomegaly.  Musculoskeletal: Shoulder, elbow, wrist, hip, knee, ankle stable, and with full range of motion.  Impression and Recommendations:    Wellness examination Assessment & Plan: Routine HCM labs ordered. HCM reviewed/discussed. Anticipatory guidance regarding healthy weight, lifestyle and choices given. Recommend healthy diet.  Recommend approximately 150 minutes/week of moderate intensity exercise Recommend regular dental and vision exams Always use seatbelt/lap and shoulder restraints Recommend using smoke alarms and checking batteries at least twice a year Recommend using sunscreen when outside Discussed tetanus immunization recommendations,  patient wishes to complete later since doing flu shot today  Orders: -     CBC with Differential/Platelet -     Comprehensive metabolic panel -     Hemoglobin A1c -     TSH Rfx on Abnormal to Free T4 -     Lipid panel -     VITAMIN D 25 Hydroxy (Vit-D Deficiency, Fractures) -     Vitamin B12 -     Magnesium  B12 deficiency -     Vitamin B12  Vitamin D deficiency -     VITAMIN D 25 Hydroxy (Vit-D Deficiency, Fractures)  Type 2 diabetes mellitus with obesity (HCC) -     Magnesium  Encounter for immunization -     Flu vaccine trivalent PF, 6mos and older(Flulaval,Afluria,Fluarix,Fluzone)  Return in about 3 months (around 10/11/2023) for diabetes.   ___________________________________________ Callen Zuba de [place], MD, ABFM, CAQSM [facility]

## 2023-07-11 NOTE — Assessment & Plan Note (Signed)
Routine HCM labs ordered. HCM reviewed/discussed. Anticipatory guidance regarding healthy weight, lifestyle and choices given. Recommend healthy diet.  Recommend approximately 150 minutes/week of moderate intensity exercise Recommend regular dental and vision exams Always use seatbelt/lap and shoulder restraints Recommend using smoke alarms and checking batteries at least twice a year Recommend using sunscreen when outside Discussed tetanus immunization recommendations, patient wishes to complete later since doing flu shot today

## 2023-07-12 LAB — LIPID PANEL
Chol/HDL Ratio: 2.3 {ratio} (ref 0.0–4.4)
Cholesterol, Total: 143 mg/dL (ref 100–199)
HDL: 63 mg/dL (ref >39)
LDL Chol Calc (NIH): 66 mg/dL (ref 0–99)
Triglycerides: 73 mg/dL (ref 0–149)
VLDL Cholesterol Cal: 14 mg/dL (ref 5–40)

## 2023-07-12 LAB — COMPREHENSIVE METABOLIC PANEL
ALT: 18 [IU]/L (ref 0–32)
AST: 20 [IU]/L (ref 0–40)
Albumin: 4.6 g/dL (ref 3.8–4.9)
Alkaline Phosphatase: 74 [IU]/L (ref 44–121)
BUN/Creatinine Ratio: 23 (ref 9–23)
BUN: 17 mg/dL (ref 6–24)
Bilirubin Total: 0.4 mg/dL (ref 0.0–1.2)
CO2: 22 mmol/L (ref 20–29)
Calcium: 9.9 mg/dL (ref 8.7–10.2)
Chloride: 103 mmol/L (ref 96–106)
Creatinine, Ser: 0.75 mg/dL (ref 0.57–1.00)
Globulin, Total: 2.9 g/dL (ref 1.5–4.5)
Glucose: 91 mg/dL (ref 70–99)
Potassium: 4.3 mmol/L (ref 3.5–5.2)
Sodium: 141 mmol/L (ref 134–144)
Total Protein: 7.5 g/dL (ref 6.0–8.5)
eGFR: 94 mL/min/{1.73_m2} (ref >59)

## 2023-07-12 LAB — CBC WITH DIFFERENTIAL/PLATELET
Basophils Absolute: 0 10*3/uL (ref 0.0–0.2)
Basos: 1 %
EOS (ABSOLUTE): 0.2 10*3/uL (ref 0.0–0.4)
Eos: 3 %
Hematocrit: 41.9 % (ref 34.0–46.6)
Hemoglobin: 13.6 g/dL (ref 11.1–15.9)
Immature Grans (Abs): 0 10*3/uL (ref 0.0–0.1)
Immature Granulocytes: 0 %
Lymphocytes Absolute: 2.6 10*3/uL (ref 0.7–3.1)
Lymphs: 44 %
MCH: 28.3 pg (ref 26.6–33.0)
MCHC: 32.5 g/dL (ref 31.5–35.7)
MCV: 87 fL (ref 79–97)
Monocytes Absolute: 0.4 10*3/uL (ref 0.1–0.9)
Monocytes: 8 %
Neutrophils Absolute: 2.6 10*3/uL (ref 1.4–7.0)
Neutrophils: 44 %
Platelets: 203 10*3/uL (ref 150–450)
RBC: 4.81 x10E6/uL (ref 3.77–5.28)
RDW: 14 % (ref 11.7–15.4)
WBC: 5.8 10*3/uL (ref 3.4–10.8)

## 2023-07-12 LAB — VITAMIN B12: Vitamin B-12: 1077 pg/mL (ref 232–1245)

## 2023-07-12 LAB — HEMOGLOBIN A1C
Est. average glucose Bld gHb Est-mCnc: 128 mg/dL
Hgb A1c MFr Bld: 6.1 % — ABNORMAL HIGH (ref 4.8–5.6)

## 2023-07-12 LAB — TSH RFX ON ABNORMAL TO FREE T4: TSH: 3.52 u[IU]/mL (ref 0.450–4.500)

## 2023-07-12 LAB — MAGNESIUM: Magnesium: 2.2 mg/dL (ref 1.6–2.3)

## 2023-07-12 LAB — VITAMIN D 25 HYDROXY (VIT D DEFICIENCY, FRACTURES): Vit D, 25-Hydroxy: 56.1 ng/mL (ref 30.0–100.0)

## 2023-07-16 ENCOUNTER — Ambulatory Visit (INDEPENDENT_AMBULATORY_CARE_PROVIDER_SITE_OTHER): Payer: 59 | Admitting: Family Medicine

## 2023-07-16 ENCOUNTER — Encounter (INDEPENDENT_AMBULATORY_CARE_PROVIDER_SITE_OTHER): Payer: Self-pay | Admitting: Family Medicine

## 2023-07-16 VITALS — BP 110/70 | HR 82 | Temp 98.1°F | Ht 65.0 in | Wt 198.0 lb

## 2023-07-16 DIAGNOSIS — Z6832 Body mass index (BMI) 32.0-32.9, adult: Secondary | ICD-10-CM | POA: Diagnosis not present

## 2023-07-16 DIAGNOSIS — Z6833 Body mass index (BMI) 33.0-33.9, adult: Secondary | ICD-10-CM

## 2023-07-16 DIAGNOSIS — E559 Vitamin D deficiency, unspecified: Secondary | ICD-10-CM

## 2023-07-16 DIAGNOSIS — Z7984 Long term (current) use of oral hypoglycemic drugs: Secondary | ICD-10-CM

## 2023-07-16 DIAGNOSIS — Z7985 Long-term (current) use of injectable non-insulin antidiabetic drugs: Secondary | ICD-10-CM

## 2023-07-16 DIAGNOSIS — E1169 Type 2 diabetes mellitus with other specified complication: Secondary | ICD-10-CM | POA: Diagnosis not present

## 2023-07-16 MED ORDER — TIRZEPATIDE 7.5 MG/0.5ML ~~LOC~~ SOAJ: 7.5000 mg | SUBCUTANEOUS | 0 refills | Status: DC

## 2023-07-16 MED ORDER — VITAMIN D (ERGOCALCIFEROL) 1.25 MG (50000 UNIT) PO CAPS: ORAL_CAPSULE | ORAL | 0 refills | Status: DC

## 2023-07-16 NOTE — Progress Notes (Signed)
Maria Glenn, D.O.  ABFM, ABOM Specializing in [facility] Bariat[facility] Medicine  Office located at: [address] [place], [place]  [address]     Assessment and Plan:   Medications Discontinued During This Encounter  Medication Reason   tirzepatide [facility]) 5 MG/0.5ML Pen    Vitamin D, Ergocalciferol, (DRISDOL) 1.25 MG (50000 UNIT) CAPS capsule Reorder    Meds ordered this encounter  Medications   Vitamin D, Ergocalciferol, (DRISDOL) 1.25 MG (50000 UNIT) CAPS capsule    Sig: 1 po q 14 days    Dispense:  3 capsule    Refill:  0   tirzepatide (MOUNJARO) 7.5 MG/0.5ML Pen    Sig: Inject 7.5 mg into the skin once a week.    Dispense:  2 mL    Refill:  0    Type 2 diabetes mellitus with obesity (HCC) Assessment: Condition is Not at goal.. She continues with Metformin and Mounjaro. She takes her Metformin about 50% of the time and usually forgets her first pill. She endorses having a increased hunger towards the end of her 7 days after her Mounjaro injection. She inquired about a dose increase. She denies GI upset or V/D.  Her A1c has improved from 6.3 to 6.1. Her CBC, CMP, and lipid panel are within normal range.  Lab Results  Component Value Date   HGBA1C 6.1 (H) 07/11/2023   HGBA1C 6.3 (H) 04/10/2023   HGBA1C 7.3 (H) 12/27/2022   INSULIN 15.9 12/27/2022   INSULIN 8.3 03/14/2021   INSULIN 13.0 12/07/2020   Lab Results  Component Value Date   WBC 5.8 07/11/2023   HGB 13.6 07/11/2023   HCT 41.9 07/11/2023   MCV 87 07/11/2023   PLT 203 07/11/2023   Lab Results  Component Value Date   CREATININE 0.75 07/11/2023   BUN 17 07/11/2023   NA 141 07/11/2023   K 4.3 07/11/2023   CL 103 07/11/2023   CO2 22 07/11/2023      Component Value Date/Time   PROT 7.5 07/11/2023 0904   ALBUMIN 4.6 07/11/2023 0904   AST 20 07/11/2023 0904   ALT 18 07/11/2023 0904   ALKPHOS 74 07/11/2023 0904   BILITOT 0.4 07/11/2023 0904   Lab Results  Component Value Date   CHOL 143  07/11/2023   HDL 63 07/11/2023   LDLCALC 66 07/11/2023   TRIG 73 07/11/2023   CHOLHDL 2.3 07/11/2023   Plan: - Continue Metformin as directed and pt denies wanting to start the XR version or refill.   - I will increase her Mounjaro from 5mg  to 7.5mg . I will refill today.    - Recheck labs in 3 months if not done at Endo provider / PCP.   - Pt had labs done by her PCP on 07/11/2023 and had Magnesium, CBC, CMP, A1c, TSH, FLP, Vit D, and B-12 done. We reviewed these with her today.   Labs were reviewed with patient today and education provided on them. We discussed how the foods patient eats may influence these laboratory findings.  All of the patient's questions about them were answered    Vitamin D deficiency Assessment: Condition is Controlled.. She regularly takes ERGO and oral B-12 supplement without difficulty. Pt has been taking her vitamin D every other week instead of 10 days. She denies any adverse side effects. Her TSH level is optimal and this is controlled with Synthroid. Her B-12 and magnesium are also within optimal range.  Lab Results  Component Value Date  VD25OH 56.1 07/11/2023   VD25OH 76.9 04/10/2023   VD25OH 27.4 (L) 12/27/2022   Lab Results  Component Value Date   VITAMINB12 1,077 07/11/2023   Lab Results  Component Value Date   TSH 3.520 07/11/2023   T3TOTAL 89 12/07/2020   Component Ref Range & Units 07/11/2023  Magnesium 1.6 - 2.3 mg/dL 2.2   Plan: - Continue Ergocalciferol 50K IU as directed and she was encouraged to continue to take the medicine until told otherwise.  I will refill today.   - Continue oral B-12 supplement and Synthroid as directed.    - Since Maria Glenn is coming up her vitamin D levels are prone to decreasing we will need to monitor levels regularly (every 3-4 mo on average) to keep levels within normal limits and prevent over supplementation.  Labs were reviewed with patient today and education provided on them. We discussed  how the foods patient eats may influence these laboratory findings.  All of the patient's questions about them were answered    TREATMENT PLAN FOR OBESITY: BMI 33.0-33.9,adult- current 32.95 Morbid obesity-start bmi 40.7/date4/3/24 Assessment:  Maria Glenn is here to discuss her progress with her obesity treatment plan along with follow-up of her obesity related diagnoses. See Medical Weight Management Flowsheet for complete bioelectrical impedance results.  Condition is not optimized. Biometric data collected today, was reviewed with patient.   Since last office visit on 06/25/2023 patient's  Muscle mass has decreased by 4.4lb. Fat mass has decreased by 1.4lb. Total body water has decreased by 1.8lb.  Counseling done on how various foods will affect these numbers and how to maximize success  Total lbs lost to date: 54 Total weight loss percentage to date: 21.43%  Plan: - Maria Glenn will work on healthier eating habits and continue following the Category 2 Plan and keeping a food journal and adhering to recommended goals of 1200-1300 calories and 90+ protein.   - I recommended the AHOY exercise program.   Behavioral Intervention Additional resources provided today: patient declined Evidence-based interventions for health behavior change were utilized today including the discussion of self monitoring techniques, problem-solving barriers and SMART goal setting techniques.   Regarding patient's less desirable eating habits and patterns, we employed the technique of small changes.  Pt will specifically work on: go to the gym 3 days a week for next visit.     She has agreed to Increase physical activity in their day and reduce sedentary time (increase NEAT).   FOLLOW UP: Return in about 3 weeks (around 08/06/2023).  She was informed of the importance of frequent follow up visits to maximize her success with intensive lifestyle modifications for her multiple health conditions.  Subjective:    Chief complaint: Obesity Maria Glenn is here to discuss her progress with her obesity treatment plan. She is on the the Category 2 Plan and keeping a food journal and adhering to recommended goals of 1200-1300 calories and 90+ protein and states she is following her eating plan approximately 80% of the time. She states she is using the treadmill 45 minutes 5 days per week.  Interval History:  Maria Glenn is here for a follow up office visit.     Since last office visit:  She has been well since last OV. She informed me that she did not go to the gym, journal, or eat completely on plan. She has had a gym membership since COVID. She notes eating protein at the minimal amount. She tries to drink 80-90oz of  water but is not measuring.   We reviewed her meal plan and all questions were answered.   Pharmacotherapy for weight loss: She is currently taking  Mounjaro and Metformin  for medical weight loss.  Denies side effects.    Review of Systems:  Pertinent positives were addressed with patient today.  Reviewed by clinician on day of visit: allergies, medications, problem list, medical history, surgical history, family history, social history, and previous encounter notes.  Weight Summary and Biometrics   Weight Lost Since Last Visit: 6lb  Weight Gained Since Last Visit: 0lb   Vitals Temp: 98.1 F (36.7 C) BP: 110/70 Pulse Rate: 82 SpO2: 98 %   Anthropometric Measurements Height: 5\' 5"  (1.651 m) Weight: 198 lb (89.8 kg) BMI (Calculated): 32.95 Weight at Last Visit: 204lb Weight Lost Since Last Visit: 6lb Weight Gained Since Last Visit: 0lb Starting Weight: 252lb Total Weight Loss (lbs): 54 lb (24.5 kg) Peak Weight: 252lb   Body Composition  Body Fat %: 40.8 % Fat Mass (lbs): 81.2 lbs Muscle Mass (lbs): 111.6 lbs Total Body Water (lbs): 75.2 lbs Visceral Fat Rating : 11   Other Maria Data Fasting: no Labs: no Today's Visit #: 13 Starting Date:  12/27/22     Objective:   PHYSICAL EXAM: Blood pressure 110/70, pulse 82, temperature 98.1 F (36.7 C), height 5\' 5"  (1.651 m), weight 198 lb (89.8 kg), SpO2 98%. Body mass index is 32.95 kg/m.  General: Well Developed, well nourished, and in no acute distress.  HEENT: Normocephalic, atraumatic Skin: Warm and dry, cap RF less 2 sec, good turgor Chest:  Normal excursion, shape, no gross abn Respiratory: speaking in full sentences, no conversational dyspnea NeuroM-Sk: Ambulates w/o assistance, moves * 4 Psych: A and O *3, insight good, mood-full  DIAGNOSTIC DATA REVIEWED:  BMET    Component Value Date/Time   NA 141 07/11/2023 0904   K 4.3 07/11/2023 0904   CL 103 07/11/2023 0904   CO2 22 07/11/2023 0904   GLUCOSE 91 07/11/2023 0904   GLUCOSE 155 (H) 08/17/2022 1016   BUN 17 07/11/2023 0904   CREATININE 0.75 07/11/2023 0904   CALCIUM 9.9 07/11/2023 0904   GFRNONAA >60 08/17/2022 1016   GFRAA 113 03/23/2020 1439   Lab Results  Component Value Date   HGBA1C 6.1 (H) 07/11/2023   HGBA1C 6.2 11/30/2016   Lab Results  Component Value Date   INSULIN 15.9 12/27/2022   INSULIN 8.7 03/23/2020   Lab Results  Component Value Date   TSH 3.520 07/11/2023   CBC    Component Value Date/Time   WBC 5.8 07/11/2023 0904   WBC 6.8 08/17/2022 1016   RBC 4.81 07/11/2023 0904   RBC 4.59 08/17/2022 1016   HGB 13.6 07/11/2023 0904   HGB 12.5 08/09/2015 1020   HCT 41.9 07/11/2023 0904   HCT 37.8 08/09/2015 1020   PLT 203 07/11/2023 0904   MCV 87 07/11/2023 0904   MCV 86 08/09/2015 1020   MCH 28.3 07/11/2023 0904   MCH 27.9 08/17/2022 1016   MCHC 32.5 07/11/2023 0904   MCHC 33.1 08/17/2022 1016   RDW 14.0 07/11/2023 0904   RDW 14.3 08/09/2015 1020   Iron Studies No results found for: "IRON", "TIBC", "FERRITIN", "IRONPCTSAT" Lipid Panel     Component Value Date/Time   CHOL 143 07/11/2023 0904   TRIG 73 07/11/2023 0904   HDL 63 07/11/2023 0904   CHOLHDL 2.3 07/11/2023  0904   CHOLHDL 4 01/22/2020 0906   VLDL 12.4  01/22/2020 0906   LDLCALC 66 07/11/2023 0904   Hepatic Function Panel     Component Value Date/Time   PROT 7.5 07/11/2023 0904   ALBUMIN 4.6 07/11/2023 0904   AST 20 07/11/2023 0904   ALT 18 07/11/2023 0904   ALKPHOS 74 07/11/2023 0904   BILITOT 0.4 07/11/2023 0904      Component Value Date/Time   TSH 3.520 07/11/2023 0904   TSH 1.680 12/27/2022 1013   Nutritional Lab Results  Component Value Date   VD25OH 56.1 07/11/2023   VD25OH 76.9 04/10/2023   VD25OH 27.4 (L) 12/27/2022    Attestations:   I, Maria Glenn, acting as a Stage manager for [employer], Maria Glenn., have compiled all relevant documentation for today's office visit on behalf of Maria Lot, Maria Glenn, while in the presence of [employer], Maria Glenn.  I have reviewed the above documentation for accuracy and completeness, and I agree with the above. Maria Glenn, D.O.  The 21st Century Cures Act was signed into law in 2016 which includes the topic of electronic health records.  This provides immediate access to information in MyChart.  This includes consultation notes, operative notes, office notes, lab results and pathology reports.  If you have any questions about what you read please let us know at your next visit so we can discuss your concerns and take corrective action if need be.  We are right here with you.

## 2023-07-24 ENCOUNTER — Other Ambulatory Visit: Payer: Self-pay

## 2023-07-24 ENCOUNTER — Emergency Department (HOSPITAL_BASED_OUTPATIENT_CLINIC_OR_DEPARTMENT_OTHER)
Admission: EM | Admit: 2023-07-24 | Discharge: 2023-07-25 | Disposition: A | Discharge status: Home / Self Care | Payer: 59 | Attending: Emergency Medicine | Admitting: Emergency Medicine

## 2023-07-24 ENCOUNTER — Emergency Department (HOSPITAL_BASED_OUTPATIENT_CLINIC_OR_DEPARTMENT_OTHER): Payer: 59 | Admitting: Radiology

## 2023-07-24 ENCOUNTER — Encounter (HOSPITAL_BASED_OUTPATIENT_CLINIC_OR_DEPARTMENT_OTHER): Payer: Self-pay

## 2023-07-24 DIAGNOSIS — R051 Acute cough: Secondary | ICD-10-CM | POA: Insufficient documentation

## 2023-07-24 DIAGNOSIS — E039 Hypothyroidism, unspecified: Secondary | ICD-10-CM | POA: Diagnosis not present

## 2023-07-24 DIAGNOSIS — R059 Cough, unspecified: Secondary | ICD-10-CM | POA: Diagnosis present

## 2023-07-24 DIAGNOSIS — Z20822 Contact with and (suspected) exposure to covid-19: Secondary | ICD-10-CM | POA: Insufficient documentation

## 2023-07-24 DIAGNOSIS — R531 Weakness: Secondary | ICD-10-CM | POA: Insufficient documentation

## 2023-07-24 MED ORDER — ALBUTEROL SULFATE HFA 108 (90 BASE) MCG/ACT IN AERS
2.0000 | INHALATION_SPRAY | RESPIRATORY_TRACT | Status: DC | PRN
  Administered 2023-07-25: 2 via RESPIRATORY_TRACT
  Filled 2023-07-24: qty 6.7

## 2023-07-24 NOTE — ED Triage Notes (Signed)
Pt POV from home reporting fatigue, chills, congestion. Son dx pneumonia last week. NAD noted in triage

## 2023-07-25 LAB — RESP PANEL BY RT-PCR (RSV, FLU A&B, COVID)  RVPGX2
Influenza A by PCR: NEGATIVE
Influenza B by PCR: NEGATIVE
Resp Syncytial Virus by PCR: NEGATIVE
SARS Coronavirus 2 by RT PCR: NEGATIVE

## 2023-07-25 MED ORDER — DOXYCYCLINE HYCLATE 100 MG PO CAPS: 100.0000 mg | ORAL_CAPSULE | Freq: Two times a day (BID) | ORAL | 0 refills | Status: AC

## 2023-07-25 MED ORDER — DOXYCYCLINE HYCLATE 100 MG PO TABS
100.0000 mg | ORAL_TABLET | Freq: Once | ORAL | Status: AC
  Administered 2023-07-25: 100 mg via ORAL
  Filled 2023-07-25: qty 1

## 2023-07-25 NOTE — ED Provider Notes (Signed)
[facility]ERGENCY [facility] Emergency Department Provider Note MRN:  [id number]  Arrival date & time: 07/25/23     Chief Complaint   Cough and Weakness   History of Present Illness   Maria Glenn is a 57 y.o. year-old female with a history of factor V Leiden, DVT presenting to the ED with chief complaint of cough and weakness.  Persistent cough for the past few days.  Family member recently diagnosed with pneumonia.  Feeling feverish today.  Review of Systems  A thorough review of systems was obtained and all systems are negative except as noted in the HPI and PMH.   Patient's Health History    Past Medical History:  Diagnosis Date   Allergy    At risk for dehydration 02/14/2021   B12 deficiency    BMI 38.0-38.9,adult 12/13/2020   Class 2 severe obesity with serious comorbidity and body mass index (BMI) of 39.0 to 39.9 in adult [facility]) 06/09/2020   Clotting disorder (HCC)    factor V Leiden   DVT (deep venous thrombosis) (HCC)    Factor V Leiden (HCC)    Fatty liver    History of blood clots    History of DVT (deep vein thrombosis) 08/09/2015   Hyperlipidemia    Hypothyroidism    Left leg DVT (HCC) 08/09/2015   Non-recurrent acute allergic otitis media of right ear 12/13/2020   Other fatigue 12/07/2020   Other hyperlipidemia 12/07/2020   Other specified hypothyroidism 12/07/2020   Prediabetes    Sinobronchitis 12/13/2020   SOBOE (shortness of breath on exertion)    SOBOE (shortness of breath on exertion) 12/07/2020   Thyroid disease    Trigeminal neuralgia    Visit for preventive health examination 11/30/2016   Vitamin D deficiency     Past Surgical History:  Procedure Laterality Date   CESAREAN SECTION      Family History  Problem Relation Age of Onset   Hypertension Mother    Hyperlipidemia Mother    Cancer Father        Lung   Pancreatic cancer Sister    Colon cancer Neg Hx    Colon polyps Neg Hx    Esophageal cancer Neg Hx    Stomach  cancer Neg Hx    Rectal cancer Neg Hx     Social History   Socioeconomic History   Marital status: Married    Spouse name: Dejiah Ranieri   Number of children: 1   Years of education: 12   Highest education level: Not on file  Occupational History   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  Tobacco Use   Smoking status: Never   Smokeless tobacco: Never  Vaping Use   Vaping status: Never Used  Substance and Sexual Activity   Alcohol use: No    Alcohol/week: 0.0 standard drinks of alcohol   Drug use: No   Sexual activity: Yes    Birth control/protection: Other-see comments    Comment: husband had vasectomy  Other Topics Concern   Not on file  Social History Narrative   Right handed   Two story home   Drinks caffeine   Social Determinants of Health   Financial Resource Strain: Low Risk  (07/11/2023)   Overall Financial Resource Strain (CARDIA)    Difficulty of Paying Living Expenses: Not hard at all  Food Insecurity: No Food Insecurity (07/11/2023)   Hunger Vital Sign    Worried About Running Out of Food in the Last Year: Never true  Ran Out of Food in the Last Year: Never true  Transportation Needs: No Transportation Needs (07/11/2023)   PRAPARE - Administrator, Civil Service (Medical): No    Lack of Transportation (Non-Medical): No  Physical Activity: Sufficiently Active (07/11/2023)   Exercise Vital Sign    Days of Exercise per Week: 5 days    Minutes of Exercise per Session: 40 min  Stress: No Stress Concern Present (07/11/2023)   [employer] of Occupational Health - Occupational Stress Questionnaire    Feeling of Stress : Not at all  Social Connections: Moderately Isolated (07/11/2023)   Social Connection and Isolation Panel [NHANES]    Frequency of Communication with Friends and Family: More than three times a week    Frequency of Social Gatherings with Friends and Family: More than three times a week    Attends Religious Services: Never     Database administrator or Organizations: No    Attends Banker Meetings: Never    Marital Status: Married  Catering manager Violence: Not At Risk (07/11/2023)   Humiliation, Afraid, Rape, and Kick questionnaire    Fear of Current or Ex-Partner: No    Emotionally Abused: No    Physically Abused: No    Sexually Abused: No     Physical Exam   Vitals:   07/25/23 0001 07/25/23 0030  BP:  (!) 148/66  Pulse:  98  Resp:  18  Temp: (!) 100.4 F (38 C)   SpO2:  94%    CONSTITUTIONAL: Well-appearing, NAD NEURO/PSYCH:  Alert and oriented x 3, no focal deficits EYES:  eyes equal and reactive ENT/NECK:  no LAD, no JVD CARDIO: Regular rate, well-perfused, normal S1 and S2 PULM:  CTAB no wheezing or rhonchi GI/GU:  non-distended, non-tender MSK/SPINE:  No gross deformities, no edema SKIN:  no rash, atraumatic   *Additional and/or pertinent findings included in MDM below  Diagnostic and Interventional Summary    EKG Interpretation Date/Time:  Tuesday July 24 2023 21:29:47 EDT Ventricular Rate:  109 PR Interval:  176 QRS Duration:  74 QT Interval:  310 QTC Calculation: 417 R Axis:   73  Text Interpretation: Sinus tachycardia Otherwise normal ECG No previous ECGs available Confirmed by Kennis Carina [phone]) on 07/25/2023 12:10:00 AM       Labs Reviewed  RESP PANEL BY RT-PCR (RSV, FLU A&B, COVID)  [id number]    DG Chest 2 View  Final Result      Medications  albuterol (VENTOLIN HFA) 108 (90 Base) MCG/ACT inhaler 2 puff (has no administration in time range)  doxycycline (VIBRA-TABS) tablet 100 mg (has no administration in time range)     Procedures  /  Critical Care Procedures  ED Course and Medical Decision Making  Initial Impression and Ddx Patient is well-appearing in no acute distress, fever up to 100.4 here in the emergency department.  Feeling some soreness in the chest and back from all of the coughing.  Favoring viral illness versus pneumonia, which  is suspicious clinically given the sick contact at home.  No hypoxia, no signs of DVT, highly doubt PE.  Past medical/surgical history that increases complexity of ED encounter: Factor V Leiden  Interpretation of Diagnostics I personally reviewed the Chest Xray and my interpretation is as follows: No pneumothorax or lobar opacity    Patient Reassessment and Ultimate Disposition/Management     Discharge  Patient management required discussion with the following services or consulting groups:  None  Complexity of  Problems Addressed Acute complicated illness or Injury  Additional Data Reviewed and Analyzed Further history obtained from: Further history from spouse/family member  Additional Factors Impacting ED Encounter Risk Prescriptions  Elmer Sow. Pilar Plate, MD [facility] Emergency Medicine [facility] [email or web address]  Final Clinical Impressions(s) / ED Diagnoses     ICD-10-CM   1. Acute cough  R05.1       ED Discharge Orders          Ordered    doxycycline (VIBRAMYCIN) 100 MG capsule  2 times daily        07/25/23 0047             Discharge Instructions Discussed with and Provided to Patient:    Discharge Instructions      You were evaluated in the Emergency Department and after careful evaluation, we did not find any emergent condition requiring admission or further testing in the hospital.  Your exam/testing today is overall reassuring.  Cough may be due to pneumonia.  Take the doxycycline antibiotic as directed, plenty of fluids and rest, Tylenol or Motrin for discomfort.  Please return to the Emergency Department if you experience any worsening of your condition.   Thank you for allowing [place] to be a part of your care.      Sabas Sous, MD 07/25/23 [phone]

## 2023-07-25 NOTE — Discharge Instructions (Signed)
You were evaluated in the Emergency Department and after careful evaluation, we did not find any emergent condition requiring admission or further testing in the hospital.  Your exam/testing today is overall reassuring.  Cough may be due to pneumonia.  Take the doxycycline antibiotic as directed, plenty of fluids and rest, Tylenol or Motrin for discomfort.  Please return to the Emergency Department if you experience any worsening of your condition.   Thank you for allowing [place] to be a part of your care.

## 2023-08-06 ENCOUNTER — Encounter (INDEPENDENT_AMBULATORY_CARE_PROVIDER_SITE_OTHER): Payer: Self-pay | Admitting: Family Medicine

## 2023-08-06 ENCOUNTER — Ambulatory Visit (INDEPENDENT_AMBULATORY_CARE_PROVIDER_SITE_OTHER): Payer: 59 | Admitting: Family Medicine

## 2023-08-06 VITALS — BP 117/76 | HR 71 | Temp 97.8°F | Ht 65.0 in | Wt 190.0 lb

## 2023-08-06 DIAGNOSIS — E559 Vitamin D deficiency, unspecified: Secondary | ICD-10-CM

## 2023-08-06 DIAGNOSIS — E1169 Type 2 diabetes mellitus with other specified complication: Secondary | ICD-10-CM | POA: Insufficient documentation

## 2023-08-06 DIAGNOSIS — Z7985 Long-term (current) use of injectable non-insulin antidiabetic drugs: Secondary | ICD-10-CM

## 2023-08-06 DIAGNOSIS — E785 Hyperlipidemia, unspecified: Secondary | ICD-10-CM | POA: Diagnosis not present

## 2023-08-06 DIAGNOSIS — Z6831 Body mass index (BMI) 31.0-31.9, adult: Secondary | ICD-10-CM

## 2023-08-06 DIAGNOSIS — Z6833 Body mass index (BMI) 33.0-33.9, adult: Secondary | ICD-10-CM

## 2023-08-06 DIAGNOSIS — Z7984 Long term (current) use of oral hypoglycemic drugs: Secondary | ICD-10-CM

## 2023-08-06 DIAGNOSIS — E538 Deficiency of other specified B group vitamins: Secondary | ICD-10-CM

## 2023-08-06 MED ORDER — CYANOCOBALAMIN 500 MCG PO TABS: 500.0000 ug | ORAL_TABLET | Freq: Every day | ORAL | Status: DC

## 2023-08-06 MED ORDER — VITAMIN D (ERGOCALCIFEROL) 1.25 MG (50000 UNIT) PO CAPS: ORAL_CAPSULE | ORAL | 0 refills | Status: DC

## 2023-08-06 MED ORDER — TIRZEPATIDE 7.5 MG/0.5ML ~~LOC~~ SOAJ: 7.5000 mg | SUBCUTANEOUS | 0 refills | Status: DC

## 2023-08-06 MED ORDER — METFORMIN HCL 500 MG PO TABS: ORAL_TABLET | ORAL | 0 refills | Status: DC

## 2023-08-06 NOTE — Progress Notes (Signed)
[name], D.O.  ABFM, ABOM Specializing in Clinical Bariatric Medicine  Office located at: [address] [place], [place]  [address]     Assessment and Plan:   Medications Discontinued During This Encounter  Medication Reason   cyanocobalamin (VITAMIN B12) 500 MCG tablet Reorder   metFORMIN (GLUCOPHAGE) 500 MG tablet Reorder   Vitamin D, Ergocalciferol, (DRISDOL) 1.25 MG (50000 UNIT) CAPS capsule Reorder   tirzepatide (MOUNJARO) 7.5 MG/0.5ML Pen Reorder    Meds ordered this encounter  Medications   Vitamin D, Ergocalciferol, (DRISDOL) 1.25 MG (50000 UNIT) CAPS capsule    Sig: 1 po q 14 days    Dispense:  3 capsule    Refill:  0   tirzepatide (MOUNJARO) 7.5 MG/0.5ML Pen    Sig: Inject 7.5 mg into the skin once a week.    Dispense:  2 mL    Refill:  0   metFORMIN (GLUCOPHAGE) 500 MG tablet    Sig: 1/2 po with lunch and dinner daily    Dispense:  30 tablet    Refill:  0    30 d supply;  ** OV for RF **   Do not send RF request   cyanocobalamin (VITAMIN B12) 500 MCG tablet    Sig: Take 1 tablet (500 mcg total) by mouth daily.  Hyperlipidemia associated with type 2 diabetes mellitus (HCC) Assessment: Condition is optimized. Pt had labs done on 07/11/2023 and we reviewed these with her today. This is the first time her LDL has been less than 70 and her triglycerides have improved from 150 to 73. This is being treated with Crestor once daily and she denies any adverse side effects on this medication.    Lab Results  Component Value Date   CHOL 143 07/11/2023   HDL 63 07/11/2023   LDLCALC 66 07/11/2023   TRIG 73 07/11/2023   CHOLHDL 2.3 07/11/2023   Plan: - Continue with Crestor 10mg  once daily as directed by prescribing provider.   - [name] agrees to continue with meds and/or our treatment plan of a heart-heathy, low cholesterol meal plan  - I stressed the importance that patient continue with our prudent nutritional plan that is low in saturated and  trans fats, and low in fatty carbs to improve these numbers.   - We will continue routine screening as patient continues to achieve health goals along their weight loss journey    B12 deficiency Assessment: Condition is Controlled.. Pt vitamin B-12 level dropped from 1,186 to 1,077 as of 07/11/2023. Patient reports good compliance and tolerance of taking OTC oral vitamin B-12 supplement.  Lab Results  Component Value Date   VITAMINB12 1,077 07/11/2023   Plan: - Continue OTC cyanocobalamin as directed. I will refill.   - Continue to consume foods rich in B-12 such as dark leafy greens (spinach, kale, etc.).    Vitamin D deficiency Assessment: Condition is Controlled.. Patient reports good compliance and tolerance of taking Ergocalciferol once every 14 days and is tolerating it well. Lab Results  Component Value Date   VD25OH 56.1 07/11/2023   VD25OH 76.9 04/10/2023   VD25OH 27.4 (L) 12/27/2022    Plan: - Continue ERGO 50k lU once every 2 weeks. I will refill.  - Informed patient this may be a lifelong thing, and she was encouraged to continue to take the medicine until told to discontinue.     - weight loss will continue to improve availability of vitamin D, thus encouraged Olegario Messier  to continue with meal plan and their weight loss efforts to further improve this condition.    Type 2 diabetes mellitus with obesity (HCC) Assessment: Condition is Improving, but not optimized.. This is being treated with Metformin and Mounjaro. She is tolerating her dose increase of Mounjaro well so far and denies any adverse side effects on these medications. Pt checks her blood sugar occasionally and this has been within normal level. Her A1c improved from 6.3 to 6.1. Pt endorses that her hunger and cravings are controlled.  Lab Results  Component Value Date   HGBA1C 6.1 (H) 07/11/2023   HGBA1C 6.3 (H) 04/10/2023   HGBA1C 7.3 (H) 12/27/2022   INSULIN 15.9 12/27/2022   INSULIN 8.3 03/14/2021    INSULIN 13.0 12/07/2020    Plan:- Continue Metformin 500mg  half tablet BID and Mounjaro 7.5 once weekly. I will refill today.   - Pt is to continue to increase her fiber and protein intake as explained on her prescribed meal plan.    - Anticipatory guidance given.    - Recheck labs in 3 months if not done at Endo provider / PCP.    TREATMENT PLAN FOR OBESITY: BMI 33.0-33.9,adult- current 31.62 Morbid obesity-start bmi 40.7/date4/3/24 Assessment:  DAKYLA GODFREY is here to discuss her progress with her obesity treatment plan along with follow-up of her obesity related diagnoses. See Medical Weight Management Flowsheet for complete bioelectrical impedance results.  Condition is not optimized. Biometric data collected today, was reviewed with patient.   Since last office visit on 07/16/2023 patient's  Muscle mass has decreased by 3.4lb. Fat mass has decreased by 1.4lb. Total body water has decreased by 1.4lb.  Counseling done on how various foods will affect these numbers and how to maximize success  Total lbs lost to date: 62 Total weight loss percentage to date: 24.60%  Plan: - Shron will continue to adhere to the Category 2 Plan and keeping a food journal and adhering to recommended goals of 1200-1300 calories and 90+ protein.   Behavioral Intervention Additional resources provided today: patient declined Evidence-based interventions for health behavior change were utilized today including the discussion of self monitoring techniques, problem-solving barriers and SMART goal setting techniques.   Regarding patient's less desirable eating habits and patterns, we employed the technique of small changes.  Pt will specifically work on: get back on track and journal every day for next visit.     She has agreed to Continue current level of physical activity  and Increase physical activity in their day and reduce sedentary time (increase NEAT).   FOLLOW UP: Return in about 3 weeks  (around 08/27/2023).  She was informed of the importance of frequent follow up visits to maximize her success with intensive lifestyle modifications for her multiple health conditions.  Subjective:   Chief complaint: Obesity Kya is here to discuss her progress with her obesity treatment plan. She is on the the Category 2 Plan and keeping a food journal and adhering to recommended goals of 1200-1300 calories and 90+ protein and states she is following her eating plan approximately 50% of the time. She states she is going to the gym 45-60 minutes 3 days per week.  Interval History:  ZAHRIAH SAKAMOTO is here for a follow up office visit.     Since last office visit:  Since last OV she informed me that she has been sick and was on bed rest for a couple of days. She states that being sick made her less hungry  which led to majority of her recent weight loss. She continues to journal at home. Pt has recently started going back to the gym since being sick.   We reviewed her meal plan and all questions were answered.   Pharmacotherapy for weight loss: She is currently taking  Metformin and Mounjaro  for medical weight loss.  Denies side effects.    Review of Systems:  Pertinent positives were addressed with patient today.  Reviewed by clinician on day of visit: allergies, medications, problem list, medical history, surgical history, family history, social history, and previous encounter notes.  Weight Summary and Biometrics   Weight Lost Since Last Visit: 8lb  Weight Gained Since Last Visit: 0lb   Vitals Temp: 97.8 F (36.6 C) BP: 117/76 Pulse Rate: 71 SpO2: 98 %   Anthropometric Measurements Height: 5\' 5"  (1.651 m) Weight: 190 lb (86.2 kg) BMI (Calculated): 31.62 Weight at Last Visit: 198lb Weight Lost Since Last Visit: 8lb Weight Gained Since Last Visit: 0lb Starting Weight: 252lb Total Weight Loss (lbs): 62 lb (28.1 kg) Peak Weight: 252lb   Body Composition  Body Fat %: 40.3  % Fat Mass (lbs): 76.8 lbs Muscle Mass (lbs): 108.2 lbs Total Body Water (lbs): 73.8 lbs Visceral Fat Rating : 10   Other Clinical Data Fasting: No Labs: No Today's Visit #: 14 Starting Date: 12/27/22     Objective:   PHYSICAL EXAM: Blood pressure 117/76, pulse 71, temperature 97.8 F (36.6 C), height 5\' 5"  (1.651 m), weight 190 lb (86.2 kg), SpO2 98%. Body mass index is 31.62 kg/m.  General: Well Developed, well nourished, and in no acute distress.  HEENT: Normocephalic, atraumatic Skin: Warm and dry, cap RF less 2 sec, good turgor Chest:  Normal excursion, shape, no gross abn Respiratory: speaking in full sentences, no conversational dyspnea NeuroM-Sk: Ambulates w/o assistance, moves * 4 Psych: A and O *3, insight good, mood-full  DIAGNOSTIC DATA REVIEWED:  BMET    Component Value Date/Time   NA 141 07/11/2023 0904   K 4.3 07/11/2023 0904   CL 103 07/11/2023 0904   CO2 22 07/11/2023 0904   GLUCOSE 91 07/11/2023 0904   GLUCOSE 155 (H) 08/17/2022 1016   BUN 17 07/11/2023 0904   CREATININE 0.75 07/11/2023 0904   CALCIUM 9.9 07/11/2023 0904   GFRNONAA >60 08/17/2022 1016   GFRAA 113 03/23/2020 1439   Lab Results  Component Value Date   HGBA1C 6.1 (H) 07/11/2023   HGBA1C 6.2 11/30/2016   Lab Results  Component Value Date   INSULIN 15.9 12/27/2022   INSULIN 8.7 03/23/2020   Lab Results  Component Value Date   TSH 3.520 07/11/2023   CBC    Component Value Date/Time   WBC 5.8 07/11/2023 0904   WBC 6.8 08/17/2022 1016   RBC 4.81 07/11/2023 0904   RBC 4.59 08/17/2022 1016   HGB 13.6 07/11/2023 0904   HGB 12.5 08/09/2015 1020   HCT 41.9 07/11/2023 0904   HCT 37.8 08/09/2015 1020   PLT 203 07/11/2023 0904   MCV 87 07/11/2023 0904   MCV 86 08/09/2015 1020   MCH 28.3 07/11/2023 0904   MCH 27.9 08/17/2022 1016   MCHC 32.5 07/11/2023 0904   MCHC 33.1 08/17/2022 1016   RDW 14.0 07/11/2023 0904   RDW 14.3 08/09/2015 1020   Iron Studies No results  found for: "IRON", "TIBC", "FERRITIN", "IRONPCTSAT" Lipid Panel     Component Value Date/Time   CHOL 143 07/11/2023 0904   TRIG 73 07/11/2023 0904  HDL 63 07/11/2023 0904   CHOLHDL 2.3 07/11/2023 0904   CHOLHDL 4 01/22/2020 0906   VLDL 12.4 01/22/2020 0906   LDLCALC 66 07/11/2023 0904   Hepatic Function Panel     Component Value Date/Time   PROT 7.5 07/11/2023 0904   ALBUMIN 4.6 07/11/2023 0904   AST 20 07/11/2023 0904   ALT 18 07/11/2023 0904   ALKPHOS 74 07/11/2023 0904   BILITOT 0.4 07/11/2023 0904      Component Value Date/Time   TSH 3.520 07/11/2023 0904   TSH 1.680 12/27/2022 1013   Nutritional Lab Results  Component Value Date   VD25OH 56.1 07/11/2023   VD25OH 76.9 04/10/2023   VD25OH 27.4 (L) 12/27/2022    Attestations:   I, Clinical biochemist, acting as a Stage manager for [employer], DO., have compiled all relevant documentation for today's office visit on behalf of Thomasene Lot, DO, while in the presence of [employer], DO.  I have reviewed the above documentation for accuracy and completeness, and I agree with the above. [name], D.O.  The 21st Century Cures Act was signed into law in 2016 which includes the topic of electronic health records.  This provides immediate access to information in MyChart.  This includes consultation notes, operative notes, office notes, lab results and pathology reports.  If you have any questions about what you read please let us know at your next visit so we can discuss your concerns and take corrective action if need be.  We are right here with you.

## 2023-08-11 ENCOUNTER — Other Ambulatory Visit (HOSPITAL_BASED_OUTPATIENT_CLINIC_OR_DEPARTMENT_OTHER): Payer: Self-pay | Admitting: Nurse Practitioner

## 2023-08-11 DIAGNOSIS — R7303 Prediabetes: Secondary | ICD-10-CM

## 2023-08-11 DIAGNOSIS — E7849 Other hyperlipidemia: Secondary | ICD-10-CM

## 2023-08-11 DIAGNOSIS — Z9189 Other specified personal risk factors, not elsewhere classified: Secondary | ICD-10-CM

## 2023-08-11 DIAGNOSIS — Z6838 Body mass index (BMI) 38.0-38.9, adult: Secondary | ICD-10-CM

## 2023-08-27 ENCOUNTER — Encounter (INDEPENDENT_AMBULATORY_CARE_PROVIDER_SITE_OTHER): Payer: Self-pay | Admitting: Family Medicine

## 2023-08-27 ENCOUNTER — Ambulatory Visit (INDEPENDENT_AMBULATORY_CARE_PROVIDER_SITE_OTHER): Payer: 59 | Admitting: Family Medicine

## 2023-08-27 VITALS — BP 124/76 | HR 76 | Temp 97.9°F | Ht 65.0 in | Wt 185.0 lb

## 2023-08-27 DIAGNOSIS — Z7985 Long-term (current) use of injectable non-insulin antidiabetic drugs: Secondary | ICD-10-CM

## 2023-08-27 DIAGNOSIS — Z6830 Body mass index (BMI) 30.0-30.9, adult: Secondary | ICD-10-CM

## 2023-08-27 DIAGNOSIS — E559 Vitamin D deficiency, unspecified: Secondary | ICD-10-CM | POA: Diagnosis not present

## 2023-08-27 DIAGNOSIS — E538 Deficiency of other specified B group vitamins: Secondary | ICD-10-CM | POA: Diagnosis not present

## 2023-08-27 DIAGNOSIS — Z7984 Long term (current) use of oral hypoglycemic drugs: Secondary | ICD-10-CM

## 2023-08-27 DIAGNOSIS — Z6833 Body mass index (BMI) 33.0-33.9, adult: Secondary | ICD-10-CM

## 2023-08-27 DIAGNOSIS — E1169 Type 2 diabetes mellitus with other specified complication: Secondary | ICD-10-CM

## 2023-08-27 NOTE — Progress Notes (Signed)
[name], D.O.  ABFM, ABOM Specializing in Clinical Bariatric Medicine  Office located at: [address] [place], [place]  [address]   Assessment and Plan:   FOR THE DISEASE OF OBESITY: BMI 33.0-33.9,adult- current 30.79 Morbid obesity-start bmi 40.7/date4/3/24 Since last office visit on 08/06/2023 patient's  Muscle mass has decreased by 0.4lb. Fat mass has decreased by 4.6lb. Total body water has decreased by 0.2lb.  Counseling done on how various foods will affect these numbers and how to maximize success  Total lbs lost to date: 67 Total weight loss percentage to date: 26.59%    Recommended Dietary Goals Maria Glenn is currently in the action stage of change. As such, her goal is to continue weight management plan.  She has agreed to: continue current plan   Behavioral Intervention We discussed the following today: increasing lean protein intake to established goals, work on tracking and journaling calories using tracking application, planning for success, and continue to work on maintaining a reduced calorie state, getting the recommended amount of protein, incorporating whole foods, making healthy choices, staying well hydrated and practicing mindfulness when eating.  Additional resources provided today: None  Evidence-based interventions for health behavior change were utilized today including the discussion of self monitoring techniques, problem-solving barriers and SMART goal setting techniques.   Regarding patient's less desirable eating habits and patterns, we employed the technique of small changes.   Pt will specifically work on: Continue to journal and exercise for next visit.    Recommended Physical Activity Goals Maria Glenn has been advised to work up to 150 minutes of moderate intensity aerobic activity a week and strengthening exercises 2-3 times per week for cardiovascular health, weight loss maintenance and preservation of muscle mass.   She has agreed to  :  Continue current level of physical activity  and continue to gradually increase the amount and intensity of exercise    Pharmacotherapy We discussed various medication options to help Maria Glenn with her weight loss efforts and we both agreed to : continue with nutritional and behavioral strategies and continue current anti-obesity medication regimen   FOR ASSOCIATED CONDITIONS ADDRESSED TODAY: Type 2 diabetes mellitus with obesity (HCC) Assessment:  Condition is Improving, but not optimized.. Pt monitors her blood sugar occasional at home and did not today. Patient reports good compliance and tolerance of taking Metformin and Mounjaro without difficulty. She denies any N/V/D or GI upset. She denies need for refill for both. Pt body fat % has improved from 40.3 to 38.9 since last visit.  Lab Results  Component Value Date   HGBA1C 6.1 (H) 07/11/2023   HGBA1C 6.3 (H) 04/10/2023   HGBA1C 7.3 (H) 12/27/2022   INSULIN 15.9 12/27/2022   INSULIN 8.3 03/14/2021   INSULIN 13.0 12/07/2020   Plan: Continue Metformin 500mg  and Mounjaro 7.5mg  as directed. We extensively discussed the importance of decreasing simple carbs, increasing proteins and how certain foods they eat will affect their blood sugars. Reminded Maria Glenn if she feels poorly- check Blood Sugar and Blood Pressure at that time. Importance of f/up with PCP and all other specialists, as scheduled, was stressed to the patient today    Vitamin D deficiency Assessment:  Condition is Controlled.. Patient reports good compliance and tolerance of taking ERGO  Lab Results  Component Value Date   VD25OH 56.1 07/11/2023   VD25OH 76.9 04/10/2023   VD25OH 27.4 (L) 12/27/2022   Plan: Continue Ergocalciferol 50K IU once every 2 weeks. I discussed the importance of  vitamin D to the patient's health and well-being as well as to their ability to lose weight. she was encouraged to continue to take the medicine until told otherwise. We will need  to monitor levels regularly (every 3-4 mo on average) to keep levels within normal limits and prevent over supplementation.   B12 deficiency Assessment:  Pt endorses having good energy and continue to take her oral Cyanocobalamin consistently. She denies any adverse side effects on this medication.  Lab Results  Component Value Date   VITAMINB12 1,077 07/11/2023   Plan: Continue with OTC oral B-12 once daily, increase food intake rich in B12.    Follow up:   Return in about 2 weeks (around 09/10/2023). She was informed of the importance of frequent follow up visits to maximize her success with intensive lifestyle modifications for her multiple health conditions.  Subjective:   Chief complaint: Obesity Maria Glenn is here to discuss her progress with her obesity treatment plan. She is on the the Category 2 Plan and keeping a food journal and adhering to recommended goals of 1200-1300 calories and 90+ protein and states she is following her eating plan approximately 85% of the time. She states she is treadmill 45 minutes 5 days per week.  Interval History:  Maria Glenn is here for a follow up office visit. Since last OV,  she has been well. Pt informed me that it is difficult eating everything on plan as it is a lot of food. She typically has a light-greek yogurt as a mid morning snack and continues to journal. Pt has made an effort to increase her protein intake.   Barriers identified:  lack of transportation for the gym and eating everything on plan .   Pharmacotherapy for weight loss: She is currently taking Metformin (off label use for incretin effect and / or insulin resistance and / or diabetes prevention) with adequate clinical response  and without side effects. and Monjauro with diabetes as the primary indication with adequate clinical response  and without side effects..   Review of Systems:  Pertinent positives were addressed with patient today.  Reviewed by clinician  on day of visit: allergies, medications, problem list, medical history, surgical history, family history, social history, and previous encounter notes.  Weight Summary and Biometrics   Weight Lost Since Last Visit: 5lb  Weight Gained Since Last Visit: 0lb    Vitals Temp: 97.9 F (36.6 C) BP: 124/76 Pulse Rate: 76 SpO2: 97 %   Anthropometric Measurements Height: 5\' 5"  (1.651 m) Weight: 185 lb (83.9 kg) BMI (Calculated): 30.79 Weight at Last Visit: 190lb Weight Lost Since Last Visit: 5lb Weight Gained Since Last Visit: 0lb Starting Weight: 252lb Total Weight Loss (lbs): 67 lb (30.4 kg) Peak Weight: 252lb   Body Composition  Body Fat %: 38.9 % Fat Mass (lbs): 72.2 lbs Muscle Mass (lbs): 107.8 lbs Total Body Water (lbs): 73.6 lbs Visceral Fat Rating : 10   Other Clinical Data Fasting: No Labs: No Today's Visit #: 15 Starting Date: 12/27/22    Objective:   PHYSICAL EXAM: Blood pressure 124/76, pulse 76, temperature 97.9 F (36.6 C), height 5\' 5"  (1.651 m), weight 185 lb (83.9 kg), SpO2 97%. Body mass index is 30.79 kg/m.  General: she is overweight, cooperative and in no acute distress. PSYCH: Has normal mood, affect and thought process.   HEENT: EOMI, sclerae are anicteric. Lungs: Normal breathing effort, no conversational dyspnea. Extremities: Moves * 4 Neurologic: A and O * 3, good  insight  DIAGNOSTIC DATA REVIEWED: BMET    Component Value Date/Time   NA 141 07/11/2023 0904   K 4.3 07/11/2023 0904   CL 103 07/11/2023 0904   CO2 22 07/11/2023 0904   GLUCOSE 91 07/11/2023 0904   GLUCOSE 155 (H) 08/17/2022 1016   BUN 17 07/11/2023 0904   CREATININE 0.75 07/11/2023 0904   CALCIUM 9.9 07/11/2023 0904   GFRNONAA >60 08/17/2022 1016   GFRAA 113 03/23/2020 1439   Lab Results  Component Value Date   HGBA1C 6.1 (H) 07/11/2023   HGBA1C 6.2 11/30/2016   Lab Results  Component Value Date   INSULIN 15.9 12/27/2022   INSULIN 8.7 03/23/2020   Lab  Results  Component Value Date   TSH 3.520 07/11/2023   CBC    Component Value Date/Time   WBC 5.8 07/11/2023 0904   WBC 6.8 08/17/2022 1016   RBC 4.81 07/11/2023 0904   RBC 4.59 08/17/2022 1016   HGB 13.6 07/11/2023 0904   HGB 12.5 08/09/2015 1020   HCT 41.9 07/11/2023 0904   HCT 37.8 08/09/2015 1020   PLT 203 07/11/2023 0904   MCV 87 07/11/2023 0904   MCV 86 08/09/2015 1020   MCH 28.3 07/11/2023 0904   MCH 27.9 08/17/2022 1016   MCHC 32.5 07/11/2023 0904   MCHC 33.1 08/17/2022 1016   RDW 14.0 07/11/2023 0904   RDW 14.3 08/09/2015 1020   Iron Studies No results found for: "IRON", "TIBC", "FERRITIN", "IRONPCTSAT" Lipid Panel     Component Value Date/Time   CHOL 143 07/11/2023 0904   TRIG 73 07/11/2023 0904   HDL 63 07/11/2023 0904   CHOLHDL 2.3 07/11/2023 0904   CHOLHDL 4 01/22/2020 0906   VLDL 12.4 01/22/2020 0906   LDLCALC 66 07/11/2023 0904   Hepatic Function Panel     Component Value Date/Time   PROT 7.5 07/11/2023 0904   ALBUMIN 4.6 07/11/2023 0904   AST 20 07/11/2023 0904   ALT 18 07/11/2023 0904   ALKPHOS 74 07/11/2023 0904   BILITOT 0.4 07/11/2023 0904      Component Value Date/Time   TSH 3.520 07/11/2023 0904   TSH 1.680 12/27/2022 1013   Nutritional Lab Results  Component Value Date   VD25OH 56.1 07/11/2023   VD25OH 76.9 04/10/2023   VD25OH 27.4 (L) 12/27/2022    Attestations:   I, Clinical biochemist, acting as a Stage manager for [employer], DO., have compiled all relevant documentation for today's office visit on behalf of Thomasene Lot, DO, while in the presence of [employer], DO.  Reviewed by clinician on day of visit: allergies, medications, problem list, medical history, surgical history, family history, social history, and previous encounter notes pertinent to patient's obesity diagnosis. preparing to see patient (e.g. review and interpretation of tests, old notes ), obtaining and/or reviewing separately obtained history,  performing a medically appropriate examination or evaluation, counseling and educating the patient, ordering medications, test or procedures, documenting clinical information in the electronic or other health care record, and independently interpreting results and communicating results to the patient, family, or caregiver   I have reviewed the above documentation for accuracy and completeness, and I agree with the above. [name], D.O.  The 21st Century Cures Act was signed into law in 2016 which includes the topic of electronic health records.  This provides immediate access to information in MyChart.  This includes consultation notes, operative notes, office notes, lab results and pathology reports.  If you have any questions about  what you read please let us know at your next visit so we can discuss your concerns and take corrective action if need be.  We are right here with you.

## 2023-09-10 ENCOUNTER — Ambulatory Visit (INDEPENDENT_AMBULATORY_CARE_PROVIDER_SITE_OTHER): Payer: 59 | Admitting: Family Medicine

## 2023-09-11 ENCOUNTER — Ambulatory Visit (INDEPENDENT_AMBULATORY_CARE_PROVIDER_SITE_OTHER): Payer: 59 | Admitting: Family Medicine

## 2023-09-11 VITALS — BP 94/62 | HR 84 | Temp 98.4°F | Ht 65.0 in | Wt 179.0 lb

## 2023-09-11 DIAGNOSIS — Z6829 Body mass index (BMI) 29.0-29.9, adult: Secondary | ICD-10-CM | POA: Diagnosis not present

## 2023-09-11 DIAGNOSIS — E669 Obesity, unspecified: Secondary | ICD-10-CM

## 2023-09-11 DIAGNOSIS — Z7984 Long term (current) use of oral hypoglycemic drugs: Secondary | ICD-10-CM

## 2023-09-11 DIAGNOSIS — Z7985 Long-term (current) use of injectable non-insulin antidiabetic drugs: Secondary | ICD-10-CM

## 2023-09-11 DIAGNOSIS — E1169 Type 2 diabetes mellitus with other specified complication: Secondary | ICD-10-CM | POA: Diagnosis not present

## 2023-09-11 DIAGNOSIS — E559 Vitamin D deficiency, unspecified: Secondary | ICD-10-CM

## 2023-09-11 MED ORDER — TIRZEPATIDE 7.5 MG/0.5ML ~~LOC~~ SOAJ: 7.5000 mg | SUBCUTANEOUS | 0 refills | Status: DC

## 2023-09-11 MED ORDER — VITAMIN D (ERGOCALCIFEROL) 1.25 MG (50000 UNIT) PO CAPS: ORAL_CAPSULE | ORAL | 0 refills | Status: DC

## 2023-09-11 MED ORDER — METFORMIN HCL 500 MG PO TABS: ORAL_TABLET | ORAL | 0 refills | Status: DC

## 2023-09-11 NOTE — Progress Notes (Signed)
SUBJECTIVE:  Chief Complaint: Obesity  Interim History: Patient first visit with me today- normally sees Dr. Val Eagle.  Sometimes she isn't able to eat all the food on her plate.  The past two weeks she has been not getting all food in due to gum graft done.  Her mouth is still recovering and dealing with soreness.  For December holidays she is planning on staying home- her in laws are coming from [place].  She is hoping to be able to slowly increase total amount of food in take over the next few weeks.  Nikoleta is here to discuss her progress with her obesity treatment plan. She is on the keeping a food journal and adhering to recommended goals of 1200-1300 calories and 90 grams of protein and states she is following her eating plan approximately 75 % of the time. She states she is exercising 45 minutes 4 times per week.   OBJECTIVE: Visit Diagnoses: Problem List Items Addressed This Visit       Endocrine   Type 2 diabetes mellitus with obesity (HCC)   Patient doing well on combination of metformin and mounjaro.  She feels a slight bit of nausea the day after taking her mounjaro but its tolerable.  She is taking half a tab twice a day of the metformin.  Last A1c improved to 6.1.  Needs refill of metformin and Mounjaro today- refills sent in.       Relevant Medications   metFORMIN (GLUCOPHAGE) 500 MG tablet   tirzepatide (MOUNJARO) 7.5 MG/0.5ML Pen     Other   Vitamin D deficiency   Discussed importance of vitamin d supplementation.  Vitamin d supplementation has been shown to decrease fatigue, decrease risk of progression to insulin resistance and then prediabetes, decreases risk of falling in older age and can even assist in decreasing depressive symptoms in PTSD.   Prescription for Vitamin D sent in.        Relevant Medications   Vitamin D, Ergocalciferol, (DRISDOL) 1.25 MG (50000 UNIT) CAPS capsule    Vitals Temp: 98.4 F (36.9 C) BP: 94/62 Pulse Rate: 84 SpO2: 97  %   Anthropometric Measurements Height: 5\' 5"  (1.651 m) Weight: 179 lb (81.2 kg) BMI (Calculated): 29.79 Weight at Last Visit: 185 lb Weight Lost Since Last Visit: 6 lb Weight Gained Since Last Visit: 0 Starting Weight: 252 lb Total Weight Loss (lbs): 73 lb (33.1 kg) Peak Weight: 252 lb   Body Composition  Body Fat %: 38.4 % Fat Mass (lbs): 68.8 lbs Muscle Mass (lbs): 104.8 lbs Total Body Water (lbs): 72.8 lbs Visceral Fat Rating : 9   Other Clinical Data Fasting: yes Labs: no Today's Visit #: 16 Starting Date: 12/27/22     ASSESSMENT AND PLAN:  Diet: Lyne is currently in the action stage of change. As such, her goal is to continue with weight loss efforts. She has agreed to keeping a food journal and adhering to recommended goals of 1200-1300 calories and 90 or more grams of protein.  Exercise: Eliyanah has been instructed that some exercise is better than none and to continue exercising as is for weight loss and overall health benefits.   Behavior Modification:  We discussed the following Behavioral Modification Strategies today: increasing lean protein intake, increasing vegetables, meal planning and cooking strategies, holiday eating strategies, planning for success, and keep a strict food journal.   No follow-ups on file.Marland Kitchen She was informed of the importance of frequent follow up visits to maximize her success  with intensive lifestyle modifications for her multiple health conditions.  Attestation Statements:   Reviewed by clinician on day of visit: allergies, medications, problem list, medical history, surgical history, family history, social history, and previous encounter notes.    Reuben Likes, MD

## 2023-09-11 NOTE — Assessment & Plan Note (Signed)
 Discussed importance of vitamin d supplementation.  Vitamin d supplementation has been shown to decrease fatigue, decrease risk of progression to insulin resistance and then prediabetes, decreases risk of falling in older age and can even assist in decreasing depressive symptoms in PTSD.   Prescription for Vitamin D sent in.

## 2023-09-11 NOTE — Assessment & Plan Note (Signed)
Patient doing well on combination of metformin and mounjaro.  She feels a slight bit of nausea the day after taking her mounjaro but its tolerable.  She is taking half a tab twice a day of the metformin.  Last A1c improved to 6.1.  Needs refill of metformin and Mounjaro today- refills sent in.

## 2023-09-29 ENCOUNTER — Other Ambulatory Visit (HOSPITAL_BASED_OUTPATIENT_CLINIC_OR_DEPARTMENT_OTHER): Payer: Self-pay | Admitting: Nurse Practitioner

## 2023-09-29 DIAGNOSIS — E038 Other specified hypothyroidism: Secondary | ICD-10-CM

## 2023-10-04 ENCOUNTER — Encounter (INDEPENDENT_AMBULATORY_CARE_PROVIDER_SITE_OTHER): Payer: Self-pay | Admitting: Family Medicine

## 2023-10-04 ENCOUNTER — Ambulatory Visit (INDEPENDENT_AMBULATORY_CARE_PROVIDER_SITE_OTHER): Payer: 59 | Admitting: Family Medicine

## 2023-10-04 VITALS — BP 103/64 | HR 75 | Temp 98.4°F | Ht 65.0 in | Wt 175.0 lb

## 2023-10-04 DIAGNOSIS — Z6829 Body mass index (BMI) 29.0-29.9, adult: Secondary | ICD-10-CM | POA: Diagnosis not present

## 2023-10-04 DIAGNOSIS — Z7984 Long term (current) use of oral hypoglycemic drugs: Secondary | ICD-10-CM

## 2023-10-04 DIAGNOSIS — E1169 Type 2 diabetes mellitus with other specified complication: Secondary | ICD-10-CM | POA: Diagnosis not present

## 2023-10-04 DIAGNOSIS — Z7985 Long-term (current) use of injectable non-insulin antidiabetic drugs: Secondary | ICD-10-CM

## 2023-10-04 DIAGNOSIS — Z6833 Body mass index (BMI) 33.0-33.9, adult: Secondary | ICD-10-CM

## 2023-10-04 DIAGNOSIS — E559 Vitamin D deficiency, unspecified: Secondary | ICD-10-CM | POA: Diagnosis not present

## 2023-10-04 MED ORDER — TIRZEPATIDE 10 MG/0.5ML ~~LOC~~ SOAJ: 10.0000 mg | SUBCUTANEOUS | 0 refills | Status: DC

## 2023-10-04 MED ORDER — VITAMIN D (ERGOCALCIFEROL) 1.25 MG (50000 UNIT) PO CAPS: ORAL_CAPSULE | ORAL | 0 refills | Status: DC

## 2023-10-04 NOTE — Progress Notes (Signed)
 Maria Glenn, D.O.  ABFM, ABOM Specializing in Clinical Bariatric Medicine  Office located at: [address] [place], [place]  [address]   Assessment and Plan:   FOR THE DISEASE OF OBESITY: BMI 33.0-33.9,adult- current 29.12 Morbid obesity-start bmi 40.7/date4/3/24 Assessment & Plan: Since last office visit on 09/11/23 patient's  Muscle mass has increased by 0.2 lb. Fat mass has decreased by 4 lb. Total body water has decreased by 0.2 lb.  Counseling done on how various foods will affect these numbers and how to maximize success  Total lbs lost to date: 77 lbs  Total weight loss percentage to date: 30.56%    Recommended Dietary Goals Maria Glenn is currently in the action stage of change. As such, her goal is to continue weight management plan.  She has agreed to: continue current plan   Behavioral Intervention We discussed the following today: continue to work on maintaining a reduced calorie state, getting the recommended amount of protein, incorporating whole foods, making healthy choices, staying well hydrated and practicing mindfulness when eating.  Additional resources provided today:  handout on benefits of losing 5-7% of body fat  Evidence-based interventions for health behavior change were utilized today including the discussion of self monitoring techniques, problem-solving barriers and SMART goal setting techniques.   Regarding patient's less desirable eating habits and patterns, we employed the technique of small changes.   Pt will specifically work on: n/a   Recommended Physical Activity Goals Maria Glenn has been advised to work up to 150 minutes of moderate intensity aerobic activity a week and strengthening exercises 2-3 times per week for cardiovascular health, weight loss maintenance and preservation of muscle mass.   She has agreed to :  returning to gym and or potentially exploring [facility]   Pharmacotherapy We both agreed to : increase  Mounjaro  to 10 mg once a week.    FOR ASSOCIATED CONDITIONS ADDRESSED TODAY:  Type 2 diabetes mellitus with obesity (HCC) Assessment & Plan: T2DM controlled on Metformin  500 mg 1/2 po with lunch and dinner and Mounjaro  7.5 mg once a week. Pt denies gastrointestinal issues. Pt is noticing a waning effect of the Mounjaro  and is starting to have increased hunger signals- would like to increase dose. Pt does not check blood sugars at home; she denies any symptoms of lows/highs.   Reminded Maria Glenn if she feels poorly- check Blood Sugar and Blood Pressure at that time. Will increase Mounjaro  to 10 mg once a week. Continue with weight loss therapy.  Orders -     Tirzepatide ; Inject 10 mg into the skin once a week.  Dispense: 2 mL; Refill: 0   Vitamin D  deficiency Assessment & Plan: Pt is compliant and tolerant with ERGO 50,000 units once every 14 days. Continue with current supplementation regiment. Recheck future.   Orders: -     Vitamin D  (Ergocalciferol ); 1 po q 14 days  Dispense: 3 capsule; Refill: 0   Follow up:   Return 11/01/2023. She was informed of the importance of frequent follow up visits to maximize her success with intensive lifestyle modifications for her multiple health conditions.  Subjective:   Chief complaint: Obesity Maria Glenn is here to discuss her progress with her obesity treatment plan. She is keeping a food journal and adhering to recommended goals of 1200-1300 calories and 90 grams of protein and states she is following her eating plan approximately 85% of the time. She states she is using the treadmill 45 minutes, 5 days per  week.   Interval History:  Maria Glenn is here for a follow up office visit. Since last OV on 09/11/23, Maria Glenn is down 4 lbs. Pt has been really protein focused and mostly did not struggle with over-eating over the holiday period. Along with continuing with the treadmill, pt would like to return to the gym.   Pharmacotherapy for  weight loss: She is currently taking  Mounjaro  7.5 mg once a week & Metformin  500 mg 1/2 po with lunch and dinner daily  .   Review of Systems:  Pertinent positives were addressed with patient today.  Reviewed by clinician on day of visit: allergies, medications, problem list, medical history, surgical history, family history, social history, and previous encounter notes.  Weight Summary and Biometrics   Weight Lost Since Last Visit: 4lb  Weight Gained Since Last Visit: 0lb   Vitals Temp: 98.4 F (36.9 C) BP: 103/64 Pulse Rate: 75 SpO2: 99 %   Anthropometric Measurements Height: 5' 5 (1.651 m) Weight: 175 lb (79.4 kg) BMI (Calculated): 29.12 Weight at Last Visit: 179lb Weight Lost Since Last Visit: 4lb Weight Gained Since Last Visit: 0lb Starting Weight: 252 lb Total Weight Loss (lbs): 77 lb (34.9 kg) Peak Weight: 252 lb   Body Composition  Body Fat %: 36.9 % Fat Mass (lbs): 64.8 lbs Muscle Mass (lbs): 105 lbs Total Body Water (lbs): 72.6 lbs Visceral Fat Rating : 9   Other Clinical Data Fasting: no Labs: no Today's Visit #: 17 Starting Date: 12/27/22   Objective:   PHYSICAL EXAM: Blood pressure 103/64, pulse 75, temperature 98.4 F (36.9 C), height 5' 5 (1.651 m), weight 175 lb (79.4 kg), SpO2 99%. Body mass index is 29.12 kg/m.  General: she is overweight, cooperative and in no acute distress. PSYCH: Has normal mood, affect and thought process.   HEENT: EOMI, sclerae are anicteric. Lungs: Normal breathing effort, no conversational dyspnea. Extremities: Moves * 4 Neurologic: A and O * 3, good insight  DIAGNOSTIC DATA REVIEWED: BMET    Component Value Date/Time   NA 141 07/11/2023 0904   K 4.3 07/11/2023 0904   CL 103 07/11/2023 0904   CO2 22 07/11/2023 0904   GLUCOSE 91 07/11/2023 0904   GLUCOSE 155 (H) 08/17/2022 1016   BUN 17 07/11/2023 0904   CREATININE 0.75 07/11/2023 0904   CALCIUM  9.9 07/11/2023 0904   GFRNONAA >60 08/17/2022 1016    GFRAA 113 03/23/2020 1439   Lab Results  Component Value Date   HGBA1C 6.1 (H) 07/11/2023   HGBA1C 6.2 11/30/2016   Lab Results  Component Value Date   INSULIN  15.9 12/27/2022   INSULIN  8.7 03/23/2020   Lab Results  Component Value Date   TSH 3.520 07/11/2023   CBC    Component Value Date/Time   WBC 5.8 07/11/2023 0904   WBC 6.8 08/17/2022 1016   RBC 4.81 07/11/2023 0904   RBC 4.59 08/17/2022 1016   HGB 13.6 07/11/2023 0904   HGB 12.5 08/09/2015 1020   HCT 41.9 07/11/2023 0904   HCT 37.8 08/09/2015 1020   PLT 203 07/11/2023 0904   MCV 87 07/11/2023 0904   MCV 86 08/09/2015 1020   MCH 28.3 07/11/2023 0904   MCH 27.9 08/17/2022 1016   MCHC 32.5 07/11/2023 0904   MCHC 33.1 08/17/2022 1016   RDW 14.0 07/11/2023 0904   RDW 14.3 08/09/2015 1020   Iron Studies No results found for: IRON, TIBC, FERRITIN, IRONPCTSAT Lipid Panel     Component Value  Date/Time   CHOL 143 07/11/2023 0904   TRIG 73 07/11/2023 0904   HDL 63 07/11/2023 0904   CHOLHDL 2.3 07/11/2023 0904   CHOLHDL 4 01/22/2020 0906   VLDL 12.4 01/22/2020 0906   LDLCALC 66 07/11/2023 0904   Hepatic Function Panel     Component Value Date/Time   PROT 7.5 07/11/2023 0904   ALBUMIN 4.6 07/11/2023 0904   AST 20 07/11/2023 0904   ALT 18 07/11/2023 0904   ALKPHOS 74 07/11/2023 0904   BILITOT 0.4 07/11/2023 0904      Component Value Date/Time   TSH 3.520 07/11/2023 0904   TSH 1.680 12/27/2022 1013   Nutritional Lab Results  Component Value Date   VD25OH 56.1 07/11/2023   VD25OH 76.9 04/10/2023   VD25OH 27.4 (L) 12/27/2022    Attestations:   I, Special Puri, acting as a stage manager for Maria Jenkins, DO., have compiled all relevant documentation for today's office visit on behalf of Maria Jenkins, DO, while in the presence of [employer], DO.  I have reviewed the above documentation for accuracy and completeness, and I agree with the above. Maria Glenn, D.O.  The 21st  Century Cures Act was signed into law in 2016 which includes the topic of electronic health records.  This provides immediate access to information in MyChart.  This includes consultation notes, operative notes, office notes, lab results and pathology reports.  If you have any questions about what you read please let us  know at your next visit so we can discuss your concerns and take corrective action if need be.  We are right here with you.

## 2023-10-30 ENCOUNTER — Other Ambulatory Visit (INDEPENDENT_AMBULATORY_CARE_PROVIDER_SITE_OTHER): Payer: Self-pay | Admitting: Family Medicine

## 2023-10-30 DIAGNOSIS — E559 Vitamin D deficiency, unspecified: Secondary | ICD-10-CM

## 2023-11-01 ENCOUNTER — Ambulatory Visit (INDEPENDENT_AMBULATORY_CARE_PROVIDER_SITE_OTHER): Payer: 59 | Admitting: Family Medicine

## 2023-11-01 ENCOUNTER — Encounter (INDEPENDENT_AMBULATORY_CARE_PROVIDER_SITE_OTHER): Payer: Self-pay | Admitting: Family Medicine

## 2023-11-01 VITALS — BP 139/57 | HR 94 | Temp 98.0°F | Ht 65.0 in | Wt 168.0 lb

## 2023-11-01 DIAGNOSIS — Z6827 Body mass index (BMI) 27.0-27.9, adult: Secondary | ICD-10-CM

## 2023-11-01 DIAGNOSIS — E785 Hyperlipidemia, unspecified: Secondary | ICD-10-CM | POA: Diagnosis not present

## 2023-11-01 DIAGNOSIS — E559 Vitamin D deficiency, unspecified: Secondary | ICD-10-CM

## 2023-11-01 DIAGNOSIS — Z7985 Long-term (current) use of injectable non-insulin antidiabetic drugs: Secondary | ICD-10-CM

## 2023-11-01 DIAGNOSIS — E1169 Type 2 diabetes mellitus with other specified complication: Secondary | ICD-10-CM | POA: Diagnosis not present

## 2023-11-01 DIAGNOSIS — Z6833 Body mass index (BMI) 33.0-33.9, adult: Secondary | ICD-10-CM

## 2023-11-01 DIAGNOSIS — Z7984 Long term (current) use of oral hypoglycemic drugs: Secondary | ICD-10-CM

## 2023-11-01 MED ORDER — METFORMIN HCL 500 MG PO TABS: ORAL_TABLET | ORAL | 0 refills | Status: DC

## 2023-11-01 MED ORDER — VITAMIN D (ERGOCALCIFEROL) 1.25 MG (50000 UNIT) PO CAPS: ORAL_CAPSULE | ORAL | 0 refills | Status: DC

## 2023-11-01 MED ORDER — TIRZEPATIDE 10 MG/0.5ML ~~LOC~~ SOAJ: 10.0000 mg | SUBCUTANEOUS | 0 refills | Status: DC

## 2023-11-01 NOTE — Progress Notes (Signed)
 Maria Glenn, D.O.  ABFM, ABOM Specializing in Clinical Bariatric Medicine  Office located at: [address] [place], [place]  [address]   Assessment and Plan:   FOR THE DISEASE OF OBESITY: BMI 33.0-33.9,adult- current 27.96 Morbid obesity-start bmi 40.7/date4/3/24 Assessment & Plan: Since last office visit on 10/04/2023 patient's  Muscle mass has decreased by 1.2 lbs. Fat mass has decreased by 6 lbs. Total body water has decreased by 2.8 lbs.  Counseling done on how various foods will affect these numbers and how to maximize success  Total lbs lost to date: 84 lbs Total weight loss percentage to date: 33.33%    Recommended Dietary Goals Maria Glenn is currently in the action stage of change. As such, her goal is to continue weight management plan.  She has agreed to: continue current plan   Behavioral Intervention We discussed the following today: increasing water intake  and using GPT or another AI platform for recipe ideas- searching low calorie, low carb, high protein chicken recipes etc, healthier pasta and bagel alternatives.  Additional resources provided today:  Handout on healthy tuna salad recipe and other Chat-GPT recipes  Evidence-based interventions for health behavior change were utilized today including the discussion of self monitoring techniques, problem-solving barriers and SMART goal setting techniques.   Regarding patient's less desirable eating habits and patterns, we employed the technique of small changes.   Pt will specifically work on: n/a   Recommended Physical Activity Goals Maria Glenn has been advised to work up to 150 minutes of moderate intensity aerobic activity a week and strengthening exercises 2-3 times per week for cardiovascular health, weight loss maintenance and preservation of muscle mass.   She has agreed to : Continue current level of physical activity    Pharmacotherapy We both agreed to : continue with nutritional and behavioral  strategies and continue anti-diabetic weight loss medications   FOR ASSOCIATED CONDITIONS ADDRESSED TODAY:  Type 2 diabetes mellitus with obesity (HCC) Assessment & Plan: T2DM managed by Metformin  500 mg 1/2 po with lunch and dinner & Mounjaro  10 mg once a week. Pt denies GI issues. She sometimes checks her blood sugars at home - she denies highs/lows. Hunger and cravings are well-controlled. Maria Glenn is getting in all of her foods and denies skipping meals. Continue with all medications are current doses - reminded pt about adequate hydration. Reviewed normal blood sugars for a diabetic. Continue RCNP. Will recheck A1c today.  Orders:  -     metFORMIN  HCl; 1/2 po with lunch and dinner daily  Dispense: 30 tablet; Refill: 0 -     Tirzepatide ; Inject 10 mg into the skin once a week.  Dispense: 2 mL; Refill: 0 -     Hemoglobin A1c   Vitamin D  deficiency Assessment & Plan: Pt is doing well on ERGO 50,000 units once every 14 days. Continue taking supplement as prescribed. Will recheck levels today.   Orders:  -     Vitamin D  (Ergocalciferol ); 1 po q 14 days  Dispense: 3 capsule; Refill: 0 -     VITAMIN D  25 Hydroxy (Vit-D Deficiency, Fractures)   Hyperlipidemia associated with type 2 diabetes mellitus Maria Glenn) Assessment & Plan: Lab Results  Component Value Date   CHOL 143 07/11/2023   HDL 63 07/11/2023   LDLCALC 66 07/11/2023   TRIG 73 07/11/2023   CHOLHDL 2.3 07/11/2023   Pt is on Rosuvastatin  10 mg daily. Most recent lipid panel above - no concerns. Will recheck levels today. Pt interested  in decreasing Rosuvastatin  to half tablet next OV. Continue heart healthy meal plan.   Orders:  -     Lipid panel   Follow up:   Return in about 1 month (around 11/29/2023). She was informed of the importance of frequent follow up visits to maximize her success with intensive lifestyle modifications for her multiple health conditions.  Maria Glenn is aware that we will review all of her lab  results at our next visit together in person.  She is aware that if anything is critical/ life threatening with the results, we will be contacting her via MyChart or by my CMA will be calling them prior to the office visit to discuss acute management.     Subjective:   Chief complaint: Obesity Maria Glenn is here to discuss her progress with her obesity treatment plan. She is keeping a food journal and adhering to recommended goals of 1200-1300 calories and 90 grams of protein and states she is following her eating plan approximately 95% of the time. She states she is going to the gym 45-60 minutes 4-5 days per week.  Interval History:  Maria Glenn is here for a follow up office visit. Since last OV on 10/04/2023, Maria Glenn has lost 7 lbs. In total has lost 10% of initial body fat percentage! She does not journal her intake all the time because she usually eats the same types of foods. She is getting in all of her foods and denies skipping meals. No complaints with meal plan and emotional eating. BP is slightly up today - she attributes this to being hungry and tired. She is completely asymptomatic - no concerns.   Pharmacotherapy for weight loss: She is currently taking Metformin  500 mg 1/2 po with lunch and dinner and Mounjaro  10 mg weekly.   Review of Systems:  Pertinent positives were addressed with patient today.  Reviewed by clinician on day of visit: allergies, medications, problem list, medical history, surgical history, family history, social history, and previous encounter notes.  Weight Summary and Biometrics   Weight Lost Since Last Visit: 7lb  Weight Gained Since Last Visit: 0   Vitals Temp: 98 F (36.7 C) BP: (!) 139/57 Pulse Rate: 94 SpO2: 100 %   Anthropometric Measurements Height: 5' 5 (1.651 m) Weight: 168 lb (76.2 kg) BMI (Calculated): 27.96 Weight at Last Visit: 175lb Weight Lost Since Last Visit: 7lb Weight Gained Since Last Visit: 0 Starting Weight:  252lb Total Weight Loss (lbs): 84 lb (38.1 kg) Peak Weight: 252lb   Body Composition  Body Fat %: 35 % Fat Mass (lbs): 58.8 lbs Muscle Mass (lbs): 103.8 lbs Total Body Water (lbs): 69.8 lbs Visceral Fat Rating : 8   Other Clinical Data Fasting: yes Labs: no Today's Visit #: 18 Starting Date: 12/27/22    Objective:   PHYSICAL EXAM: Blood pressure (!) 139/57, pulse 94, temperature 98 F (36.7 C), height 5' 5 (1.651 m), weight 168 lb (76.2 kg), SpO2 100%. Body mass index is 27.96 kg/m.  General: she is overweight, cooperative and in no acute distress. PSYCH: Has normal mood, affect and thought process.   HEENT: EOMI, sclerae are anicteric. Lungs: Normal breathing effort, no conversational dyspnea. Extremities: Moves * 4 Neurologic: A and O * 3, good insight  DIAGNOSTIC DATA REVIEWED: BMET    Component Value Date/Time   NA 141 07/11/2023 0904   K 4.3 07/11/2023 0904   CL 103 07/11/2023 0904   CO2 22 07/11/2023 0904   GLUCOSE 91 07/11/2023  9095   GLUCOSE 155 (H) 08/17/2022 1016   BUN 17 07/11/2023 0904   CREATININE 0.75 07/11/2023 0904   CALCIUM  9.9 07/11/2023 0904   GFRNONAA >60 08/17/2022 1016   GFRAA 113 03/23/2020 1439   Lab Results  Component Value Date   HGBA1C 6.1 (H) 07/11/2023   HGBA1C 6.2 11/30/2016   Lab Results  Component Value Date   INSULIN  15.9 12/27/2022   INSULIN  8.7 03/23/2020   Lab Results  Component Value Date   TSH 3.520 07/11/2023   CBC    Component Value Date/Time   WBC 5.8 07/11/2023 0904   WBC 6.8 08/17/2022 1016   RBC 4.81 07/11/2023 0904   RBC 4.59 08/17/2022 1016   HGB 13.6 07/11/2023 0904   HGB 12.5 08/09/2015 1020   HCT 41.9 07/11/2023 0904   HCT 37.8 08/09/2015 1020   PLT 203 07/11/2023 0904   MCV 87 07/11/2023 0904   MCV 86 08/09/2015 1020   MCH 28.3 07/11/2023 0904   MCH 27.9 08/17/2022 1016   MCHC 32.5 07/11/2023 0904   MCHC 33.1 08/17/2022 1016   RDW 14.0 07/11/2023 0904   RDW 14.3 08/09/2015 1020    Iron Studies No results found for: IRON, TIBC, FERRITIN, IRONPCTSAT Lipid Panel     Component Value Date/Time   CHOL 143 07/11/2023 0904   TRIG 73 07/11/2023 0904   HDL 63 07/11/2023 0904   CHOLHDL 2.3 07/11/2023 0904   CHOLHDL 4 01/22/2020 0906   VLDL 12.4 01/22/2020 0906   LDLCALC 66 07/11/2023 0904   Hepatic Function Panel     Component Value Date/Time   PROT 7.5 07/11/2023 0904   ALBUMIN 4.6 07/11/2023 0904   AST 20 07/11/2023 0904   ALT 18 07/11/2023 0904   ALKPHOS 74 07/11/2023 0904   BILITOT 0.4 07/11/2023 0904      Component Value Date/Time   TSH 3.520 07/11/2023 0904   TSH 1.680 12/27/2022 1013   Nutritional Lab Results  Component Value Date   VD25OH 56.1 07/11/2023   VD25OH 76.9 04/10/2023   VD25OH 27.4 (L) 12/27/2022    Attestations:   I, Special Puri, acting as a stage manager for Maria Jenkins, DO., have compiled all relevant documentation for today's office visit on behalf of Maria Jenkins, DO, while in the presence of [employer], DO.  I have reviewed the above documentation for accuracy and completeness, and I agree with the above. Maria Glenn, D.O.  The 21st Century Cures Act was signed into law in 2016 which includes the topic of electronic health records.  This provides immediate access to information in MyChart.  This includes consultation notes, operative notes, office notes, lab results and pathology reports.  If you have any questions about what you read please let us  know at your next visit so we can discuss your concerns and take corrective action if need be.  We are right here with you.

## 2023-11-02 LAB — LIPID PANEL
Chol/HDL Ratio: 1.9 {ratio} (ref 0.0–4.4)
Cholesterol, Total: 130 mg/dL (ref 100–199)
HDL: 67 mg/dL (ref >39)
LDL Chol Calc (NIH): 51 mg/dL (ref 0–99)
Triglycerides: 53 mg/dL (ref 0–149)
VLDL Cholesterol Cal: 12 mg/dL (ref 5–40)

## 2023-11-02 LAB — HEMOGLOBIN A1C
Est. average glucose Bld gHb Est-mCnc: 120 mg/dL
Hgb A1c MFr Bld: 5.8 % — ABNORMAL HIGH (ref 4.8–5.6)

## 2023-11-02 LAB — VITAMIN D 25 HYDROXY (VIT D DEFICIENCY, FRACTURES): Vit D, 25-Hydroxy: 83.4 ng/mL (ref 30.0–100.0)

## 2023-11-19 ENCOUNTER — Ambulatory Visit: Payer: 59 | Admitting: Podiatry

## 2023-11-19 ENCOUNTER — Encounter: Payer: Self-pay | Admitting: Podiatry

## 2023-11-19 DIAGNOSIS — L600 Ingrowing nail: Secondary | ICD-10-CM | POA: Diagnosis not present

## 2023-11-19 MED ORDER — DOXYCYCLINE HYCLATE 100 MG PO TABS: 100.0000 mg | ORAL_TABLET | Freq: Two times a day (BID) | ORAL | 0 refills | Status: DC

## 2023-11-19 NOTE — Progress Notes (Signed)
 Chief Complaint  Patient presents with   Diabetes    "I have ingrown toenails.  My A1c is 5.8." N - ingrown toenails L - hallux bilateral D - 20 years O - slowly gotten worse C - ingrown, tender, sore A - if a different person does my Pedicures, some shoes T - Pedicures    Subjective: Patient presents today for evaluation of pain to the medial and lateral border bilateral great toes. Patient is concerned for possible ingrown nail.  It is very sensitive to touch.  Patient presents today for further treatment and evaluation.  Past Medical History:  Diagnosis Date   Allergy    At risk for dehydration 02/14/2021   B12 deficiency    BMI 38.0-38.9,adult 12/13/2020   Class 2 severe obesity with serious comorbidity and body mass index (BMI) of 39.0 to 39.9 in adult [facility]) 06/09/2020   Clotting disorder (HCC)    factor V Leiden   DVT (deep venous thrombosis) (HCC)    Factor V Leiden (HCC)    Fatty liver    History of blood clots    History of DVT (deep vein thrombosis) 08/09/2015   Hyperlipidemia    Hypothyroidism    Left leg DVT (HCC) 08/09/2015   Non-recurrent acute allergic otitis media of right ear 12/13/2020   Other fatigue 12/07/2020   Other hyperlipidemia 12/07/2020   Other specified hypothyroidism 12/07/2020   Prediabetes    Sinobronchitis 12/13/2020   SOBOE (shortness of breath on exertion)    SOBOE (shortness of breath on exertion) 12/07/2020   Thyroid disease    Trigeminal neuralgia    Visit for preventive health examination 11/30/2016   Vitamin D deficiency     Past Surgical History:  Procedure Laterality Date   CESAREAN SECTION      Allergies  Allergen Reactions   Penicillins Shortness Of Breath    Objective:  General: Well developed, nourished, in no acute distress, alert and oriented x3   Dermatology: Skin is warm, dry and supple bilateral.  Medial and lateral border bilateral great toes is tender with evidence of an ingrowing nail. Pain on  palpation noted to the border of the nail fold. The remaining nails appear unremarkable at this time.   Vascular: DP and PT pulses palpable.  No clinical evidence of vascular compromise  Neruologic: Grossly intact via light touch bilateral.  Musculoskeletal: No pedal deformity noted  Assesement: #1 Paronychia with ingrowing nail medial and lateral border bilateral great toes  Plan of Care:  -Patient evaluated.  -Discussed treatment alternatives and plan of care. Explained nail avulsion procedure and post procedure course to patient. -Patient opted for permanent partial nail avulsion of the ingrown portion of the nail.  -Prior to procedure, local anesthesia infiltration utilized using 3 ml of a 50:50 mixture of 2% plain lidocaine and 0.5% plain marcaine in a normal hallux block fashion and a betadine prep performed.  -Partial permanent nail avulsion with chemical matrixectomy performed using 3x30sec applications of phenol followed by alcohol flush.  -Light dressing applied.  Post care instructions provided -Return to clinic 3 weeks  Felecia Shelling, DPM [facility]  Dr. Felecia Shelling, DPM    [address], [place] [address]  Office [phone]  Fax [phone]

## 2023-11-28 ENCOUNTER — Encounter (HOSPITAL_BASED_OUTPATIENT_CLINIC_OR_DEPARTMENT_OTHER): Payer: Self-pay | Admitting: *Deleted

## 2023-11-29 ENCOUNTER — Encounter (INDEPENDENT_AMBULATORY_CARE_PROVIDER_SITE_OTHER): Payer: Self-pay | Admitting: Family Medicine

## 2023-11-29 ENCOUNTER — Ambulatory Visit (INDEPENDENT_AMBULATORY_CARE_PROVIDER_SITE_OTHER): Payer: 59 | Admitting: Family Medicine

## 2023-11-29 VITALS — BP 98/62 | HR 87 | Temp 97.8°F | Ht 65.0 in | Wt 163.0 lb

## 2023-11-29 DIAGNOSIS — Z7984 Long term (current) use of oral hypoglycemic drugs: Secondary | ICD-10-CM

## 2023-11-29 DIAGNOSIS — E1169 Type 2 diabetes mellitus with other specified complication: Secondary | ICD-10-CM

## 2023-11-29 DIAGNOSIS — E785 Hyperlipidemia, unspecified: Secondary | ICD-10-CM

## 2023-11-29 DIAGNOSIS — E669 Obesity, unspecified: Secondary | ICD-10-CM

## 2023-11-29 DIAGNOSIS — L600 Ingrowing nail: Secondary | ICD-10-CM

## 2023-11-29 DIAGNOSIS — Z6833 Body mass index (BMI) 33.0-33.9, adult: Secondary | ICD-10-CM

## 2023-11-29 DIAGNOSIS — Z7985 Long-term (current) use of injectable non-insulin antidiabetic drugs: Secondary | ICD-10-CM

## 2023-11-29 DIAGNOSIS — Z6827 Body mass index (BMI) 27.0-27.9, adult: Secondary | ICD-10-CM

## 2023-11-29 DIAGNOSIS — E559 Vitamin D deficiency, unspecified: Secondary | ICD-10-CM

## 2023-11-29 MED ORDER — ROSUVASTATIN CALCIUM 5 MG PO TABS: 5.0000 mg | ORAL_TABLET | Freq: Every day | ORAL | 0 refills | Status: DC

## 2023-11-29 MED ORDER — TIRZEPATIDE 10 MG/0.5ML ~~LOC~~ SOAJ: 10.0000 mg | SUBCUTANEOUS | 0 refills | Status: DC

## 2023-11-29 MED ORDER — VITAMIN D3 50 MCG (2000 UT) PO CAPS: 2000.0000 [IU] | ORAL_CAPSULE | Freq: Every day | ORAL | Status: DC

## 2023-11-29 MED ORDER — METFORMIN HCL 500 MG PO TABS: ORAL_TABLET | ORAL | 0 refills | Status: DC

## 2023-11-29 NOTE — Progress Notes (Signed)
 [name], D.O.  ABFM, ABOM Specializing in Clinical Bariatric Medicine  Office located at: [address] [place], [place]  [address]   Assessment and Plan:   FOR THE DISEASE OF OBESITY:  BMI 33.0-33.9,adult- current 27.12 Morbid obesity-start bmi 40.7/date4/3/24 Assessment & Plan: Since last office visit on 11/01/2023 patient's  Muscle mass has decreased by 3 lb. Fat mass has decreased by 1.8 lb. Total body water has decreased by 1.2 lb.  Counseling done on how various foods will affect these numbers and how to maximize success  Total lbs lost to date: 89 lbs  Total weight loss percentage to date: 35.32%    Recommended Dietary Goals Maria Glenn is currently in the action stage of change. As such, her goal is to continue weight management plan.  She has agreed to: continue current plan   Behavioral Intervention We discussed the following today: increasing lean protein intake to established goals  Additional resources provided today: None  Evidence-based interventions for health behavior change were utilized today including the discussion of self monitoring techniques, problem-solving barriers and SMART goal setting techniques.   Regarding patient's less desirable eating habits and patterns, we employed the technique of small changes.   Pt will specifically work on: n/a   Recommended Physical Activity Goals Maria Glenn has been advised to work up to 150 minutes of moderate intensity aerobic activity a week and strengthening exercises 2-3 times per week for cardiovascular health, weight loss maintenance and preservation of muscle mass.   She has agreed to : return to exercise when Maria Glenn by Dr.Evans of podiatry.    Pharmacotherapy We both agreed to : continue with nutritional and behavioral strategies and continue medication regimen   FOR ASSOCIATED CONDITIONS ADDRESSED TODAY:  Type 2 diabetes mellitus with obesity (HCC) Assessment & Plan: Lab Results  Component  Value Date   HGBA1C 5.8 (H) 11/01/2023    DM well controlled on Mounjaro 10 mg weekly & Metformin 500 mg 1/2 po with lunch and dinner as evidenced by most recent A1c. Denies symptoms associated w/ hypoglycemia or hyperglycemia. No c/o hunger or cravings. Continue all meds at current doses. Continue meal plan.   Relevant Orders:  -     Tirzepatide; Inject 10 mg into the skin once a week.  Dispense: 2 mL; Refill: 0 -     metFORMIN HCl; 1/2 po with lunch and dinner daily  Dispense: 30 tablet; Refill: 0   Hyperlipidemia associated with type 2 diabetes mellitus [facility]) Assessment & Plan: Lab Results  Component Value Date   CHOL 130 11/01/2023   HDL 67 11/01/2023   LDLCALC 51 11/01/2023   TRIG 53 11/01/2023   CHOLHDL 1.9 11/01/2023   Currently on Crestor 10 mg daily. Most recent lipid panel WNL. HDL improved. Shared decision making: Decrease Crestor to 5 mg daily. Continue heart healthy MP. Increase exercise as tolerated.   Relevant Orders:  -     Rosuvastatin Calcium; Take 1 tablet (5 mg total) by mouth daily.  Dispense: 30 tablet; Refill: 0   Vitamin D deficiency Assessment & Plan: Lab Results  Component Value Date   VD25OH 83.4 11/01/2023   VD25OH 56.1 07/11/2023   VD25OH 76.9 04/10/2023   Her VD level is high. Currently on ERGO 50,000 units once every 14 days. Discontinue strength vit D and switch to OTC Vit D 2,000 units daily. Recheck - 3 months.   Relevant Orders:  -     Vitamin D3; Take 1 capsule (2,000 Units total)  by mouth daily.   Recurrent Ingrown toenail of both feet Assessment & Plan: Had 2 ingrown toenails removed by Dr.Evans on 11/19/2023. Pt healing well and was told she can start to exercise next week or so. Discussed toenail care and advised her to avoid using clippers and instead to use nail files. F/up with Dr.Evans as directed.    Follow up:   Return 12/27/2023. She was informed of the importance of frequent follow up visits to maximize her success with  intensive lifestyle modifications for her multiple health conditions.  Subjective:   Chief complaint: Obesity Maria Glenn is here to discuss her progress with her obesity treatment plan. She is  keeping a food journal and adhering to recommended goals of 1200-1300 calories and 90 grams of proteim and states she is following her eating plan approximately 90% of the time. She states she is not exercising d/t a recent in-grown toe nail surgery.  Interval History:  Maria Glenn is here for a follow up office visit. Since last OV on 11/01/2023, Maria Glenn is down 5 lbs. Had 2 ingrown toenails removed by Dr.Evans on 11/19/2023. Pt healing well and was told she can start to exercise next week or so. Food wise, pt has no complaints w/ her meal plan.   Pharmacotherapy for weight loss: She is currently taking Metformin 500 mg 1/2 po with lunch and dinner and Mounjaro 10 mg weekly.   Review of Systems:  Pertinent positives were addressed with patient today.  Reviewed by clinician on day of visit: allergies, medications, problem list, medical history, surgical history, family history, social history, and previous encounter notes.  Weight Summary and Biometrics   Weight Lost Since Last Visit: 5 lb  Weight Gained Since Last Visit: 0   Vitals Temp: 97.8 F (36.6 C) BP: 98/62 Pulse Rate: 87 SpO2: 99 %   Anthropometric Measurements Height: 5\' 5"  (1.651 m) Weight: 163 lb (73.9 kg) BMI (Calculated): 27.12 Weight at Last Visit: 168 lb Weight Lost Since Last Visit: 5 lb Weight Gained Since Last Visit: 0 Starting Weight: 252 lb Total Weight Loss (lbs): 89 lb (40.4 kg) Peak Weight: 252 lb   Body Composition  Body Fat %: 34.9 % Fat Mass (lbs): 57 lbs Muscle Mass (lbs): 100.8 lbs Total Body Water (lbs): 68.6 lbs Visceral Fat Rating : 8   Other Clinical Data Fasting: Yes Today's Visit #: 19 Starting Date: 12/27/22   Objective:   PHYSICAL EXAM: Blood pressure 98/62, pulse 87, temperature 97.8  F (36.6 C), height 5\' 5"  (1.651 m), weight 163 lb (73.9 kg), last menstrual period 04/26/2023, SpO2 99%. Body mass index is 27.12 kg/m.  General: she is overweight, cooperative and in no acute distress. PSYCH: Has normal mood, affect and thought process.   HEENT: EOMI, sclerae are anicteric. Lungs: Normal breathing effort, no conversational dyspnea. Extremities: Moves * 4 Neurologic: A and O * 3, good insight  DIAGNOSTIC DATA REVIEWED: BMET    Component Value Date/Time   NA 141 07/11/2023 0904   K 4.3 07/11/2023 0904   CL 103 07/11/2023 0904   CO2 22 07/11/2023 0904   GLUCOSE 91 07/11/2023 0904   GLUCOSE 155 (H) 08/17/2022 1016   BUN 17 07/11/2023 0904   CREATININE 0.75 07/11/2023 0904   CALCIUM 9.9 07/11/2023 0904   GFRNONAA >60 08/17/2022 1016   GFRAA 113 03/23/2020 1439   Lab Results  Component Value Date   HGBA1C 5.8 (H) 11/01/2023   HGBA1C 6.2 11/30/2016   Lab Results  Component  Value Date   INSULIN 15.9 12/27/2022   INSULIN 8.7 03/23/2020   Lab Results  Component Value Date   TSH 3.520 07/11/2023   CBC    Component Value Date/Time   WBC 5.8 07/11/2023 0904   WBC 6.8 08/17/2022 1016   RBC 4.81 07/11/2023 0904   RBC 4.59 08/17/2022 1016   HGB 13.6 07/11/2023 0904   HGB 12.5 08/09/2015 1020   HCT 41.9 07/11/2023 0904   HCT 37.8 08/09/2015 1020   PLT 203 07/11/2023 0904   MCV 87 07/11/2023 0904   MCV 86 08/09/2015 1020   MCH 28.3 07/11/2023 0904   MCH 27.9 08/17/2022 1016   MCHC 32.5 07/11/2023 0904   MCHC 33.1 08/17/2022 1016   RDW 14.0 07/11/2023 0904   RDW 14.3 08/09/2015 1020   Iron Studies No results found for: "IRON", "TIBC", "FERRITIN", "IRONPCTSAT" Lipid Panel     Component Value Date/Time   CHOL 130 11/01/2023 0855   TRIG 53 11/01/2023 0855   HDL 67 11/01/2023 0855   CHOLHDL 1.9 11/01/2023 0855   CHOLHDL 4 01/22/2020 0906   VLDL 12.4 01/22/2020 0906   LDLCALC 51 11/01/2023 0855   Hepatic Function Panel     Component Value  Date/Time   PROT 7.5 07/11/2023 0904   ALBUMIN 4.6 07/11/2023 0904   AST 20 07/11/2023 0904   ALT 18 07/11/2023 0904   ALKPHOS 74 07/11/2023 0904   BILITOT 0.4 07/11/2023 0904      Component Value Date/Time   TSH 3.520 07/11/2023 0904   TSH 1.680 12/27/2022 1013   Nutritional Lab Results  Component Value Date   VD25OH 83.4 11/01/2023   VD25OH 56.1 07/11/2023   VD25OH 76.9 04/10/2023    Attestations:   I, Special Puri, acting as a Stage manager for Thomasene Lot, DO., have compiled all relevant documentation for today's office visit on behalf of Thomasene Lot, DO, while in the presence of [employer], DO.  I have reviewed the above documentation for accuracy and completeness, and I agree with the above. [name], D.O.  The 21st Century Cures Act was signed into law in 2016 which includes the topic of electronic health records.  This provides immediate access to information in MyChart.  This includes consultation notes, operative notes, office notes, lab results and pathology reports.  If you have any questions about what you read please let us know at your next visit so we can discuss your concerns and take corrective action if need be.  We are right here with you.

## 2023-12-12 ENCOUNTER — Ambulatory Visit (INDEPENDENT_AMBULATORY_CARE_PROVIDER_SITE_OTHER): Payer: 59 | Admitting: Podiatry

## 2023-12-12 ENCOUNTER — Encounter: Payer: Self-pay | Admitting: Podiatry

## 2023-12-12 VITALS — Ht 65.0 in | Wt 163.0 lb

## 2023-12-12 DIAGNOSIS — L600 Ingrowing nail: Secondary | ICD-10-CM | POA: Diagnosis not present

## 2023-12-12 NOTE — Progress Notes (Signed)
   Chief Complaint  Patient presents with   Ingrown Toenail    Ingrown follow-up. On bilateral great toenails.     Subjective: 58 y.o. female presents today status post permanent nail avulsion procedure of the medial and lateral border of the bilateral great toes that was performed on 11/19/2023.  Patient doing well.  She says the toenails feel significantly better.  She did have some initial pain after the procedure the following day or two but that resolved and currently no pain.   Past Medical History:  Diagnosis Date   Allergy    At risk for dehydration 02/14/2021   B12 deficiency    BMI 38.0-38.9,adult 12/13/2020   Class 2 severe obesity with serious comorbidity and body mass index (BMI) of 39.0 to 39.9 in adult [facility]) 06/09/2020   Clotting disorder (HCC)    factor V Leiden   DVT (deep venous thrombosis) (HCC)    Factor V Leiden (HCC)    Fatty liver    History of blood clots    History of DVT (deep vein thrombosis) 08/09/2015   Hyperlipidemia    Hypothyroidism    Left leg DVT (HCC) 08/09/2015   Non-recurrent acute allergic otitis media of right ear 12/13/2020   Other fatigue 12/07/2020   Other hyperlipidemia 12/07/2020   Other specified hypothyroidism 12/07/2020   Prediabetes    Sinobronchitis 12/13/2020   SOBOE (shortness of breath on exertion)    SOBOE (shortness of breath on exertion) 12/07/2020   Thyroid disease    Trigeminal neuralgia    Visit for preventive health examination 11/30/2016   Vitamin D deficiency     Objective: Neurovascular status intact.  Skin is warm, dry and supple. Nail and respective nail fold appears to be healing appropriately.   Assessment: #1 s/p partial permanent nail matrixectomy medial and lateral border bilateral great toes.  11/19/2023   Plan of care: #1 patient was evaluated  #2  No matricectomy sites were evaluated.  They are very dry and stable.  There is some dried scab to the areas but no drainage and it should heal  uneventfully without complication #3 return to clinic on a PRN basis.   Felecia Shelling, DPM [facility]  Dr. Felecia Shelling, DPM    [address] [place], [place] [address]                Office [phone]  Fax [phone]

## 2023-12-27 ENCOUNTER — Encounter (INDEPENDENT_AMBULATORY_CARE_PROVIDER_SITE_OTHER): Payer: Self-pay | Admitting: Family Medicine

## 2023-12-27 ENCOUNTER — Ambulatory Visit (INDEPENDENT_AMBULATORY_CARE_PROVIDER_SITE_OTHER): Payer: 59 | Admitting: Family Medicine

## 2023-12-27 DIAGNOSIS — E538 Deficiency of other specified B group vitamins: Secondary | ICD-10-CM | POA: Diagnosis not present

## 2023-12-27 DIAGNOSIS — E785 Hyperlipidemia, unspecified: Secondary | ICD-10-CM

## 2023-12-27 DIAGNOSIS — E1169 Type 2 diabetes mellitus with other specified complication: Secondary | ICD-10-CM

## 2023-12-27 DIAGNOSIS — Z7985 Long-term (current) use of injectable non-insulin antidiabetic drugs: Secondary | ICD-10-CM

## 2023-12-27 DIAGNOSIS — E559 Vitamin D deficiency, unspecified: Secondary | ICD-10-CM | POA: Diagnosis not present

## 2023-12-27 DIAGNOSIS — Z6826 Body mass index (BMI) 26.0-26.9, adult: Secondary | ICD-10-CM

## 2023-12-27 MED ORDER — TIRZEPATIDE 12.5 MG/0.5ML ~~LOC~~ SOAJ: 12.5000 mg | SUBCUTANEOUS | 1 refills | Status: DC

## 2023-12-27 NOTE — Progress Notes (Signed)
 Maria Glenn, D.O.  ABFM, ABOM Specializing in Clinical Bariatric Medicine  Office located at: [address] [place], [place]  [address]   Assessment and Plan:   Medications Discontinued During This Encounter  Medication Reason   Cholecalciferol (VITAMIN D3) 50 MCG (2000 UT) capsule Patient Preference   doxycycline (VIBRA-TABS) 100 MG tablet Patient Preference   tirzepatide (MOUNJARO) 10 MG/0.5ML Pen     Meds ordered this encounter  Medications   tirzepatide (MOUNJARO) 12.5 MG/0.5ML Pen    Sig: Inject 12.5 mg into the skin once a week.    Dispense:  2 mL    Refill:  1    Will obtain labs at her next OV (lipid panel, etc).   FOR THE DISEASE OF OBESITY: Morbid obesity-start bmi 40.7/date4/3/24 BMI 26.0-26.9,adult -- Current BMI 26.46 Assessment & Plan: Since last office visit on 11/29/23 patient's muscle mass has increased by 1.2 lb. Fat mass has decreased by 4.8 lb. Total body water has increased by 1 lb.  Counseling done on how various foods will affect these numbers and how to maximize success  Total lbs lost to date: 93 lbs Total weight loss percentage to date: -36.90%   When she first started the program in 02/2020, she weighed 216. Her highest body fat percentage was 48.0 on 12/07/20. Exactly 1 year ago today, on 12/27/22, she was her peak weight at 252 lbs and her body fat percentage was 45.3%. Today, she is 159 lbs and her body fat percentage is 32.7%.    Recommended Dietary Goals Maria Glenn is currently in the action stage of change. As such, her goal is to continue weight management plan.  She has agreed to: continue current plan   Behavioral Intervention We discussed the following today: increasing lean protein intake to established goals, decreasing simple carbohydrates , going to the skinnytaste website for recipe ideas, and using GPT or another AI platform for recipe ideas- searching "low calorie, low carb, high protein chicken recipes" etc  Additional  resources provided today: Handout on risks/ benefits of metformin and associated Myths of use, Handout on benefits of metformin for weight loss, Handout on Common Characteristics of Successful Weight Losers,, and Provided with personal guidance and instructions on how to use [employer]com for healthy meal ideas and cooking in bulk.  Evidence-based interventions for health behavior change were utilized today including the discussion of self monitoring techniques, problem-solving barriers and SMART goal setting techniques.   Regarding patient's less desirable eating habits and patterns, we employed the technique of small changes.   Pt will specifically work on: n/a   Recommended Physical Activity Goals Maria Glenn has been advised to work up to 150 minutes of moderate intensity aerobic activity a week and strengthening exercises 2-3 times per week for cardiovascular health, weight loss maintenance and preservation of muscle mass.   She has agreed to :  Continue current level of physical activity    Pharmacotherapy We both agreed to: continue with nutritional and behavioral strategies, adequate clinical response to current dose, continue current regimen, and increase Mounjaro to 12.5 mg once weekly.    FOR ASSOCIATED CONDITIONS ADDRESSED TODAY: Type 2 diabetes mellitus with obesity [facility]) Assessment & Plan: Lab Results  Component Value Date   HGBA1C 5.8 (H) 11/01/2023   HGBA1C 6.1 (H) 07/11/2023   HGBA1C 6.3 (H) 04/10/2023   INSULIN 15.9 12/27/2022   INSULIN 8.3 03/14/2021   INSULIN 13.0 12/07/2020    Pt is on Mounjaro 10 mg once weekly on Fridays  and Metformin 250 mg BID with occasional missed doses. Tolerating both meds well with no reported adverse SE. She has been working on eating on plan consistently. Pt is interested in increasing her Mounjaro dose today. No hunger or cravings. Denies any hypoglycemia/hyperglycemia.   Mutually agreed with patient to increase Mounjaro to 12.5 mg once  weekly. Additionally, I reviewed the benefits and potential risks/side effects of Metformin with the patient; she was also provided with handouts containing this information. Continue with Metformin at her current dose. Will continue to monitor condition as it relates to her weight loss journey.   Orders: - Increase Mounjaro to 12.5 mg once weekly.    Hyperlipidemia associated with type 2 diabetes mellitus [facility]) Assessment & Plan: Lab Results  Component Value Date   CHOL 130 11/01/2023   HDL 67 11/01/2023   LDLCALC 51 11/01/2023   TRIG 53 11/01/2023   CHOLHDL 1.9 11/01/2023   Pt is currently on Crestor 5 mg once daily. She reports reducing her dose to half a pill (5 mg) as of 3/6. Good compliance and tolerance with no SE reported.   Continue with current statin therapy as prescribed. Encouraged pt to follow heart healthy diet per her meal plan. Will plan to recheck her lipid panel at her next OV. Will continue to monitor condition as it pertains to her weight loss.    Vitamin D deficiency Assessment & Plan: Lab Results  Component Value Date   VD25OH 83.4 11/01/2023   VD25OH 56.1 07/11/2023   VD25OH 76.9 04/10/2023   Pt is compliant with OTC vitamin D supplementation. Tolerating well with no adverse side effects. No acute concerns at this time. Continue current supplementation regimen, no changes were made today. Will continue to monitor.    B12 deficiency Assessment & Plan: Lab Results  Component Value Date   VITAMINB12 1,077 07/11/2023   Pt is compliant with OTC B12 supplementation. Tolerating well with no adverse side effects. No acute concerns reported today. Continue current supplementation regimen. No changes made today. Will continue to monitor.   Follow up:   Return in about 6 weeks (around 02/07/2024) for 6 week follow up and Schedule another follow up for 8 weeks after.Marland Kitchen She was informed of the importance of frequent follow up visits to maximize her success with  intensive lifestyle modifications for her multiple health conditions.  Subjective:   Chief complaint: Obesity Maria Glenn is here to discuss her progress with her obesity treatment plan. She is keeping a food journal and adhering to recommended goals of 1200-1300 calories and 90 g of protein and states she is following her eating plan approximately 95% of the time. She states she is going to the gym 60 minutes 5 days per week.   Interval History:  Maria Glenn is here for a follow up office visit. Since last OV on 11/29/23,  she is down 4 lbs. She states she has not struggled with her meal plan. Overall she has been doing well at consistently eating on plan and exercising.   Pharmacotherapy for weight loss: She is currently taking Metformin with diabetes as primary indication with adequate clinical response  and without side effects. and Topiramate (off label use, single agent) with adequate clinical response  and without side effects..   Review of Systems:  Pertinent positives were addressed with patient today.  Reviewed by clinician on day of visit: allergies, medications, problem list, medical history, surgical history, family history, social history, and previous encounter notes.  Weight Summary and  Biometrics   Weight Lost Since Last Visit: 4 lb  Weight Gained Since Last Visit: 0    Vitals Temp: 98.2 F (36.8 C) BP: 118/71 Pulse Rate: 81 SpO2: 99 %   Anthropometric Measurements Height: 5\' 5"  (1.651 m) Weight: 159 lb (72.1 kg) BMI (Calculated): 26.46 Weight at Last Visit: 163 lb Weight Lost Since Last Visit: 4 lb Weight Gained Since Last Visit: 0 Starting Weight: 252 lb Total Weight Loss (lbs): 93 lb (42.2 kg) Peak Weight: 252 lb   Body Composition  Body Fat %: 32.7 % Fat Mass (lbs): 52.2 lbs Muscle Mass (lbs): 102 lbs Total Body Water (lbs): 69.6 lbs Visceral Fat Rating : 7   Other Clinical Data Fasting: yes Labs: no Today's Visit #: 20 Starting Date:  12/27/22    Objective:   PHYSICAL EXAM: Blood pressure 118/71, pulse 81, temperature 98.2 F (36.8 C), height 5\' 5"  (1.651 m), weight 159 lb (72.1 kg), SpO2 99%. Body mass index is 26.46 kg/m.  General: she is overweight, cooperative and in no acute distress. PSYCH: Has normal mood, affect and thought process.   HEENT: EOMI, sclerae are anicteric. Lungs: Normal breathing effort, no conversational dyspnea. Extremities: Moves * 4 Neurologic: A and O * 3, good insight  DIAGNOSTIC DATA REVIEWED: BMET    Component Value Date/Time   NA 141 07/11/2023 0904   K 4.3 07/11/2023 0904   CL 103 07/11/2023 0904   CO2 22 07/11/2023 0904   GLUCOSE 91 07/11/2023 0904   GLUCOSE 155 (H) 08/17/2022 1016   BUN 17 07/11/2023 0904   CREATININE 0.75 07/11/2023 0904   CALCIUM 9.9 07/11/2023 0904   GFRNONAA >60 08/17/2022 1016   GFRAA 113 03/23/2020 1439   Lab Results  Component Value Date   HGBA1C 5.8 (H) 11/01/2023   HGBA1C 6.2 11/30/2016   Lab Results  Component Value Date   INSULIN 15.9 12/27/2022   INSULIN 8.7 03/23/2020   Lab Results  Component Value Date   TSH 3.520 07/11/2023   CBC    Component Value Date/Time   WBC 5.8 07/11/2023 0904   WBC 6.8 08/17/2022 1016   RBC 4.81 07/11/2023 0904   RBC 4.59 08/17/2022 1016   HGB 13.6 07/11/2023 0904   HGB 12.5 08/09/2015 1020   HCT 41.9 07/11/2023 0904   HCT 37.8 08/09/2015 1020   PLT 203 07/11/2023 0904   MCV 87 07/11/2023 0904   MCV 86 08/09/2015 1020   MCH 28.3 07/11/2023 0904   MCH 27.9 08/17/2022 1016   MCHC 32.5 07/11/2023 0904   MCHC 33.1 08/17/2022 1016   RDW 14.0 07/11/2023 0904   RDW 14.3 08/09/2015 1020   Iron Studies No results found for: "IRON", "TIBC", "FERRITIN", "IRONPCTSAT" Lipid Panel     Component Value Date/Time   CHOL 130 11/01/2023 0855   TRIG 53 11/01/2023 0855   HDL 67 11/01/2023 0855   CHOLHDL 1.9 11/01/2023 0855   CHOLHDL 4 01/22/2020 0906   VLDL 12.4 01/22/2020 0906   LDLCALC 51  11/01/2023 0855   Hepatic Function Panel     Component Value Date/Time   PROT 7.5 07/11/2023 0904   ALBUMIN 4.6 07/11/2023 0904   AST 20 07/11/2023 0904   ALT 18 07/11/2023 0904   ALKPHOS 74 07/11/2023 0904   BILITOT 0.4 07/11/2023 0904      Component Value Date/Time   TSH 3.520 07/11/2023 0904   TSH 1.680 12/27/2022 1013   Nutritional Lab Results  Component Value Date   VD25OH 83.4 11/01/2023  VD25OH 56.1 07/11/2023   VD25OH 76.9 04/10/2023    Attestations:   I, Isabelle Course, acting as a medical scribe for Thomasene Lot, DO., have compiled all relevant documentation for today's office visit on behalf of Thomasene Lot, DO, while in the presence of [employer], DO.  Reviewed by clinician on day of visit: allergies, medications, problem list, medical history, surgical history, family history, social history, and previous encounter notes pertinent to patient's obesity diagnosis.  I have reviewed the above documentation for accuracy and completeness, and I agree with the above. Maria Glenn, D.O.  The 21st Century Cures Act was signed into law in 2016 which includes the topic of electronic health records.  This provides immediate access to information in MyChart.  This includes consultation notes, operative notes, office notes, lab results and pathology reports.  If you have any questions about what you read please let us know at your next visit so we can discuss your concerns and take corrective action if need be.  We are right here with you.

## 2024-01-06 ENCOUNTER — Other Ambulatory Visit (HOSPITAL_BASED_OUTPATIENT_CLINIC_OR_DEPARTMENT_OTHER): Payer: Self-pay | Admitting: Family Medicine

## 2024-01-06 DIAGNOSIS — E038 Other specified hypothyroidism: Secondary | ICD-10-CM

## 2024-02-04 ENCOUNTER — Other Ambulatory Visit (HOSPITAL_BASED_OUTPATIENT_CLINIC_OR_DEPARTMENT_OTHER): Payer: Self-pay | Admitting: Family Medicine

## 2024-02-04 DIAGNOSIS — Z6838 Body mass index (BMI) 38.0-38.9, adult: Secondary | ICD-10-CM

## 2024-02-04 DIAGNOSIS — Z9189 Other specified personal risk factors, not elsewhere classified: Secondary | ICD-10-CM

## 2024-02-04 DIAGNOSIS — E7849 Other hyperlipidemia: Secondary | ICD-10-CM

## 2024-02-04 DIAGNOSIS — R7303 Prediabetes: Secondary | ICD-10-CM

## 2024-02-07 ENCOUNTER — Encounter (INDEPENDENT_AMBULATORY_CARE_PROVIDER_SITE_OTHER): Payer: Self-pay | Admitting: Family Medicine

## 2024-02-07 ENCOUNTER — Ambulatory Visit (INDEPENDENT_AMBULATORY_CARE_PROVIDER_SITE_OTHER): Admitting: Family Medicine

## 2024-02-07 VITALS — BP 102/66 | HR 73 | Temp 98.0°F | Ht 65.0 in | Wt 150.0 lb

## 2024-02-07 DIAGNOSIS — E669 Obesity, unspecified: Secondary | ICD-10-CM

## 2024-02-07 DIAGNOSIS — E785 Hyperlipidemia, unspecified: Secondary | ICD-10-CM | POA: Diagnosis not present

## 2024-02-07 DIAGNOSIS — Z6824 Body mass index (BMI) 24.0-24.9, adult: Secondary | ICD-10-CM | POA: Diagnosis not present

## 2024-02-07 DIAGNOSIS — E1169 Type 2 diabetes mellitus with other specified complication: Secondary | ICD-10-CM

## 2024-02-07 DIAGNOSIS — Z7984 Long term (current) use of oral hypoglycemic drugs: Secondary | ICD-10-CM

## 2024-02-07 DIAGNOSIS — Z7985 Long-term (current) use of injectable non-insulin antidiabetic drugs: Secondary | ICD-10-CM

## 2024-02-07 MED ORDER — TIRZEPATIDE 12.5 MG/0.5ML ~~LOC~~ SOAJ: 12.5000 mg | SUBCUTANEOUS | 1 refills | Status: DC

## 2024-02-07 MED ORDER — METFORMIN HCL 500 MG PO TABS: ORAL_TABLET | ORAL | 1 refills | Status: DC

## 2024-02-07 NOTE — Progress Notes (Signed)
 Maria Glenn, D.O.  ABFM, ABOM Specializing in Clinical Bariatric Medicine  Office located at: [address] [place], [place]  [address]   Assessment and Plan:   Medications Discontinued During This Encounter  Medication Reason   metFORMIN  (GLUCOPHAGE ) 500 MG tablet Reorder   tirzepatide  (MOUNJARO ) 12.5 MG/0.5ML Pen Reorder     Meds ordered this encounter  Medications   metFORMIN  (GLUCOPHAGE ) 500 MG tablet    Sig: 1/2 po with lunch and dinner daily    Dispense:  30 tablet    Refill:  1    30 d supply;  ** OV for RF **   Do not send RF request   tirzepatide  (MOUNJARO ) 12.5 MG/0.5ML Pen    Sig: Inject 12.5 mg into the skin once a week.    Dispense:  2 mL    Refill:  1    Recheck thyroid  levels, A1c, lipid panel, etc next OV.  FOR THE DISEASE OF OBESITY:  BMI 24.0-24.9, adult - current BM 24.96 Morbid obesity-start bmi 40.7/date4/3/24 Assessment & Plan: Since last office visit on 12/27/2023 patient's  Muscle mass has decreased by 2.6 lb. Fat mass has decreased by 6.8 lb. Total body water has decreased by 2.6 lb.  Counseling done on how various foods will affect these numbers and how to maximize success  Total lbs lost to date: 102 lbs  Total weight loss percentage to date: 40.48%    Recommended Dietary Goals Maria Glenn is currently in the action stage of change. As such, her goal is to continue weight management plan.  She has agreed to: continue current plan   Behavioral Intervention We discussed the following today: continue to work on maintaining a reduced calorie state, getting the recommended amount of protein, incorporating whole foods, making healthy choices, staying well hydrated and practicing mindfulness when eating.  Additional resources provided today: None  Evidence-based interventions for health behavior change were utilized today including the discussion of self monitoring techniques, problem-solving barriers and SMART goal setting techniques.    Regarding patient's less desirable eating habits and patterns, we employed the technique of small changes.   Pt will specifically work on n/a    Recommended Physical Activity Goals Maria Glenn has been advised to work up to 300-450 minutes of moderate intensity aerobic activity a week and strengthening exercises 2-3 times per week for cardiovascular health, weight loss maintenance and preservation of muscle mass.   She has agreed to Continue current level of physical activity    Pharmacotherapy We both agreed to continue same regimen.    ASSOCIATED CONDITIONS ADDRESSED TODAY:  Type 2 diabetes mellitus with obesity (HCC) Assessment & Plan: Most recent A1c: Lab Results  Component Value Date   HGBA1C 5.8 (H) 11/01/2023   HGBA1C 6.1 (H) 07/11/2023   HGBA1C 6.3 (H) 04/10/2023   HgbA1c is at goal for age and comorbid conditions. Denies symptoms of hypoglycemia or hyperglycemia. Occasionally checks her FBS: most recent FBS was in the 80s. On Metformin  500 mg 0.5 po with lunch and dinner daily & Tirzepatide  12.5 mg weekly w/ good adherence and no side effects. Her hunger and cravings are controlled.   Continue regimen & reduced calorie meal plan low on processed crabs and simple sugars. Reminded pt if she feels poorly- check blood sugar at that time. Reminded pt about getting yearly DM eye & foot exams and urine microalb: crt ratio tests.Recheck labs next OV.    Hyperlipidemia associated with type 2 diabetes mellitus (HCC) Assessment & Plan:  Most recent lipid panel:  Lab Results  Component Value Date   CHOL 130 11/01/2023   HDL 67 11/01/2023   LDLCALC 51 11/01/2023   TRIG 53 11/01/2023   CHOLHDL 1.9 11/01/2023   Decreased her Crestor  to 5 mg daily on 11/29/2023.   Continue regimen and nutrition plan -decreasing simple carbohydrates, increasing lean proteins, decreasing saturated fats and cholesterol , avoiding trans fats and exercise as able to promote weight loss, improve lipids and  decrease cardiovascular risks. Will recheck labs next OV.    Follow up:   Return 04/03/2024 at  8:20 AM. She was informed of the importance of frequent follow up visits to maximize her success with intensive lifestyle modifications for her multiple health conditions.  Subjective:   Chief complaint: Obesity Maria Glenn is here to discuss her progress with her obesity treatment plan. She is keeping a food journal and adhering to recommended goals of 1200-1300 calories and 90 g of protein and states she is following her eating plan approximately 90% of the time. She states she is doing strength and cardio training 60 minutes 5 days per week.  Interval History:  Maria Glenn is here for a follow up office visit. Since last OV on 12/27/23, Maria Glenn is down 9 lbs. Acknowledges eating out frequently, however made healthy choices and chose the high protein items to the best of her ability. States she's felt more tired this past month and attributes it to sometimes not sleeping well b/c of hot flashes.   Pharmacotherapy that aid with weight loss: She is currently taking Metformin  500 mg 0.5 po with lunch and dinner daily & Tirzepatide  12.5 mg weekly.   Review of Systems:  Pertinent positives were addressed with patient today.  Reviewed by clinician on day of visit: allergies, medications, problem list, medical history, surgical history, family history, social history, and previous encounter notes.  Weight Summary and Biometrics   Weight Lost Since Last Visit: 9lb  Weight Gained Since Last Visit: 0   Vitals Temp: 98 F (36.7 C) BP: 102/66 Pulse Rate: 73 SpO2: 99 %   Anthropometric Measurements Height: 5\' 5"  (1.651 m) Weight: 150 lb (68 kg) BMI (Calculated): 24.96 Weight at Last Visit: 159lb Weight Lost Since Last Visit: 9lb Weight Gained Since Last Visit: 0 Starting Weight: 252lb Total Weight Loss (lbs): 102 lb (46.3 kg) Peak Weight: 252lb   Body Composition  Body Fat %: 30.2  % Fat Mass (lbs): 45.4 lbs Muscle Mass (lbs): 99.4 lbs Total Body Water (lbs): 67 lbs Visceral Fat Rating : 6   Other Clinical Data Fasting: yes Labs: no Today's Visit #: 21 Starting Date: 12/27/22   Objective:   PHYSICAL EXAM: Blood pressure 102/66, pulse 73, temperature 98 F (36.7 C), height 5\' 5"  (1.651 m), weight 150 lb (68 kg), SpO2 99%. Body mass index is 24.96 kg/m.  General: she is overweight, cooperative and in no acute distress. PSYCH: Has normal mood, affect and thought process.   HEENT: EOMI, sclerae are anicteric. Lungs: Normal breathing effort, no conversational dyspnea. Extremities: Moves * 4 Neurologic: A and O * 3, good insight  DIAGNOSTIC DATA REVIEWED: BMET    Component Value Date/Time   NA 141 07/11/2023 0904   K 4.3 07/11/2023 0904   CL 103 07/11/2023 0904   CO2 22 07/11/2023 0904   GLUCOSE 91 07/11/2023 0904   GLUCOSE 155 (H) 08/17/2022 1016   BUN 17 07/11/2023 0904   CREATININE 0.75 07/11/2023 0904   CALCIUM  9.9 07/11/2023 0904  GFRNONAA >60 08/17/2022 1016   GFRAA 113 03/23/2020 1439   Lab Results  Component Value Date   HGBA1C 5.8 (H) 11/01/2023   HGBA1C 6.2 11/30/2016   Lab Results  Component Value Date   INSULIN  15.9 12/27/2022   INSULIN  8.7 03/23/2020   Lab Results  Component Value Date   TSH 3.520 07/11/2023   CBC    Component Value Date/Time   WBC 5.8 07/11/2023 0904   WBC 6.8 08/17/2022 1016   RBC 4.81 07/11/2023 0904   RBC 4.59 08/17/2022 1016   HGB 13.6 07/11/2023 0904   HGB 12.5 08/09/2015 1020   HCT 41.9 07/11/2023 0904   HCT 37.8 08/09/2015 1020   PLT 203 07/11/2023 0904   MCV 87 07/11/2023 0904   MCV 86 08/09/2015 1020   MCH 28.3 07/11/2023 0904   MCH 27.9 08/17/2022 1016   MCHC 32.5 07/11/2023 0904   MCHC 33.1 08/17/2022 1016   RDW 14.0 07/11/2023 0904   RDW 14.3 08/09/2015 1020   Iron Studies No results found for: "IRON", "TIBC", "FERRITIN", "IRONPCTSAT" Lipid Panel     Component Value  Date/Time   CHOL 130 11/01/2023 0855   TRIG 53 11/01/2023 0855   HDL 67 11/01/2023 0855   CHOLHDL 1.9 11/01/2023 0855   CHOLHDL 4 01/22/2020 0906   VLDL 12.4 01/22/2020 0906   LDLCALC 51 11/01/2023 0855   Hepatic Function Panel     Component Value Date/Time   PROT 7.5 07/11/2023 0904   ALBUMIN 4.6 07/11/2023 0904   AST 20 07/11/2023 0904   ALT 18 07/11/2023 0904   ALKPHOS 74 07/11/2023 0904   BILITOT 0.4 07/11/2023 0904      Component Value Date/Time   TSH 3.520 07/11/2023 0904   TSH 1.680 12/27/2022 1013   Nutritional Lab Results  Component Value Date   VD25OH 83.4 11/01/2023   VD25OH 56.1 07/11/2023   VD25OH 76.9 04/10/2023    Attestations:   I, Special Puri, acting as a Stage manager for Marceil Sensor, DO., have compiled all relevant documentation for today's office visit on behalf of Marceil Sensor, DO, while in the presence of [employer], DO.  I have reviewed the above documentation for accuracy and completeness, and I agree with the above. Maria Glenn, D.O.  The 21st Century Cures Act was signed into law in 2016 which includes the topic of electronic health records.  This provides immediate access to information in MyChart.  This includes consultation notes, operative notes, office notes, lab results and pathology reports.  If you have any questions about what you read please let us  know at your next visit so we can discuss your concerns and take corrective action if need be.  We are right here with you.

## 2024-02-13 LAB — COMPREHENSIVE METABOLIC PANEL WITH GFR
Albumin: 4.6 (ref 3.5–5.0)
Calcium: 9.7 (ref 8.7–10.7)
Globulin: 2.3
eGFR: 85

## 2024-02-13 LAB — HEPATIC FUNCTION PANEL
ALT: 43 U/L — AB (ref 7–35)
AST: 30 (ref 13–35)
Alkaline Phosphatase: 63 (ref 25–125)
Bilirubin, Total: 0.5

## 2024-02-13 LAB — BASIC METABOLIC PANEL WITH GFR
BUN: 20 (ref 4–21)
CO2: 23 — AB (ref 13–22)
Chloride: 97 — AB (ref 99–108)
Creatinine: 0.8 (ref 0.5–1.1)
Glucose: 93
Potassium: 4.2 meq/L (ref 3.5–5.1)
Sodium: 135 — AB (ref 137–147)

## 2024-02-13 LAB — PROTEIN / CREATININE RATIO, URINE: Creatinine, Urine: 25

## 2024-04-03 ENCOUNTER — Ambulatory Visit (INDEPENDENT_AMBULATORY_CARE_PROVIDER_SITE_OTHER): Admitting: Family Medicine

## 2024-04-03 ENCOUNTER — Other Ambulatory Visit (HOSPITAL_BASED_OUTPATIENT_CLINIC_OR_DEPARTMENT_OTHER): Payer: Self-pay | Admitting: Family Medicine

## 2024-04-03 ENCOUNTER — Encounter (INDEPENDENT_AMBULATORY_CARE_PROVIDER_SITE_OTHER): Payer: Self-pay | Admitting: Family Medicine

## 2024-04-03 VITALS — BP 100/66 | HR 80 | Temp 98.7°F | Ht 65.0 in | Wt 144.0 lb

## 2024-04-03 DIAGNOSIS — E1169 Type 2 diabetes mellitus with other specified complication: Secondary | ICD-10-CM | POA: Diagnosis not present

## 2024-04-03 DIAGNOSIS — R5382 Chronic fatigue, unspecified: Secondary | ICD-10-CM | POA: Diagnosis not present

## 2024-04-03 DIAGNOSIS — E538 Deficiency of other specified B group vitamins: Secondary | ICD-10-CM

## 2024-04-03 DIAGNOSIS — E038 Other specified hypothyroidism: Secondary | ICD-10-CM

## 2024-04-03 DIAGNOSIS — E785 Hyperlipidemia, unspecified: Secondary | ICD-10-CM

## 2024-04-03 DIAGNOSIS — R0602 Shortness of breath: Secondary | ICD-10-CM | POA: Diagnosis not present

## 2024-04-03 DIAGNOSIS — Z6823 Body mass index (BMI) 23.0-23.9, adult: Secondary | ICD-10-CM

## 2024-04-03 DIAGNOSIS — E669 Obesity, unspecified: Secondary | ICD-10-CM

## 2024-04-03 DIAGNOSIS — E559 Vitamin D deficiency, unspecified: Secondary | ICD-10-CM | POA: Diagnosis not present

## 2024-04-03 DIAGNOSIS — Z7985 Long-term (current) use of injectable non-insulin antidiabetic drugs: Secondary | ICD-10-CM

## 2024-04-03 DIAGNOSIS — Z7984 Long term (current) use of oral hypoglycemic drugs: Secondary | ICD-10-CM

## 2024-04-03 MED ORDER — METFORMIN HCL 500 MG PO TABS: ORAL_TABLET | ORAL | 1 refills | Status: DC

## 2024-04-03 MED ORDER — TIRZEPATIDE 12.5 MG/0.5ML ~~LOC~~ SOAJ: 12.5000 mg | SUBCUTANEOUS | 1 refills | Status: DC

## 2024-04-04 LAB — CBC WITH DIFFERENTIAL/PLATELET
Basophils Absolute: 0 x10E3/uL (ref 0.0–0.2)
Basos: 1 %
EOS (ABSOLUTE): 0.3 x10E3/uL (ref 0.0–0.4)
Eos: 5 %
Hematocrit: 42.1 % (ref 34.0–46.6)
Hemoglobin: 13.2 g/dL (ref 11.1–15.9)
Immature Grans (Abs): 0 x10E3/uL (ref 0.0–0.1)
Immature Granulocytes: 0 %
Lymphocytes Absolute: 2.6 x10E3/uL (ref 0.7–3.1)
Lymphs: 48 %
MCH: 29.2 pg (ref 26.6–33.0)
MCHC: 31.4 g/dL — ABNORMAL LOW (ref 31.5–35.7)
MCV: 93 fL (ref 79–97)
Monocytes Absolute: 0.3 x10E3/uL (ref 0.1–0.9)
Monocytes: 6 %
Neutrophils Absolute: 2.1 x10E3/uL (ref 1.4–7.0)
Neutrophils: 40 %
Platelets: 197 x10E3/uL (ref 150–450)
RBC: 4.52 x10E6/uL (ref 3.77–5.28)
RDW: 12.9 % (ref 11.7–15.4)
WBC: 5.4 x10E3/uL (ref 3.4–10.8)

## 2024-04-04 LAB — INSULIN, RANDOM: INSULIN: 3.2 u[IU]/mL (ref 2.6–24.9)

## 2024-04-04 LAB — TSH RFX ON ABNORMAL TO FREE T4: TSH: 1.22 u[IU]/mL (ref 0.450–4.500)

## 2024-04-04 LAB — VITAMIN B12: Vitamin B-12: 1662 pg/mL — ABNORMAL HIGH (ref 232–1245)

## 2024-04-04 LAB — HEMOGLOBIN A1C
Est. average glucose Bld gHb Est-mCnc: 111 mg/dL
Hgb A1c MFr Bld: 5.5 % (ref 4.8–5.6)

## 2024-04-04 LAB — LIPID PANEL
Chol/HDL Ratio: 2.2 ratio (ref 0.0–4.4)
Cholesterol, Total: 152 mg/dL (ref 100–199)
HDL: 70 mg/dL (ref >39)
LDL Chol Calc (NIH): 70 mg/dL (ref 0–99)
Triglycerides: 55 mg/dL (ref 0–149)
VLDL Cholesterol Cal: 12 mg/dL (ref 5–40)

## 2024-04-04 LAB — MAGNESIUM: Magnesium: 2.1 mg/dL (ref 1.6–2.3)

## 2024-04-04 LAB — VITAMIN D 25 HYDROXY (VIT D DEFICIENCY, FRACTURES): Vit D, 25-Hydroxy: 73.7 ng/mL (ref 30.0–100.0)

## 2024-04-04 NOTE — Progress Notes (Signed)
 [name], D.O.  ABFM, ABOM Specializing in Clinical Bariatric Medicine  Office located at: [address] [place], [place]  [address]   Assessment and Plan:   Orders Placed This Encounter  Procedures   VITAMIN D  25 Hydroxy (Vit-D Deficiency, Fractures)   Lipid panel   CBC with Differential/Platelet   Hemoglobin A1c   Insulin , random   TSH Rfx on Abnormal to Free T4   Magnesium   Vitamin B12   Basic metabolic panel with GFR   Comprehensive metabolic panel with GFR   Hepatic function panel   Protein / creatinine ratio, urine    Medications Discontinued During This Encounter  Medication Reason   metFORMIN  (GLUCOPHAGE ) 500 MG tablet Reorder   tirzepatide  (MOUNJARO ) 12.5 MG/0.5ML Pen Reorder   rosuvastatin  (CRESTOR ) 5 MG tablet      Meds ordered this encounter  Medications   metFORMIN  (GLUCOPHAGE ) 500 MG tablet    Sig: 1/2 po with lunch and dinner daily    Dispense:  30 tablet    Refill:  1    30 d supply;  ** OV for RF **   Do not send RF request   tirzepatide  (MOUNJARO ) 12.5 MG/0.5ML Pen    Sig: Inject 12.5 mg into the skin once a week.    Dispense:  2 mL    Refill:  1     FOR THE DISEASE OF OBESITY:  BMI 23.0-23.9, adult current 23.96 Morbid obesity-start bmi 40.7/date4/3/24 Assessment & Plan: Since last office visit on 02/07/2024 patient's muscle mass has decreased by 1 lbs. Fat mass has decreased by 4.6 lbs. Total body water has increased by 1.8 lbs.  Body fat percentage decreased by 2%. Counseling done on how various foods will affect these numbers and how to maximize success  Total lbs lost to date: - 108 lbs  Total weight loss percentage to date: -42.86%    Recommended Dietary Goals Miasha is currently in the action stage of change. As such, her goal is to continue weight management plan.  She has agreed to: see SOBOE note.   Behavioral Intervention We discussed the following today: healthy sources of fats, increasing water intake    Additional resources provided today: None  Evidence-based interventions for health behavior change were utilized today including the discussion of self monitoring techniques, problem-solving barriers and SMART goal setting techniques.   Regarding patient's less desirable eating habits and patterns, we employed the technique of small changes.   Pt will specifically work on: n/a   Recommended Physical Activity Goals Davia has been advised to work up to 300-450 minutes of moderate intensity aerobic activity a week and strengthening exercises 2-3 times per week for cardiovascular health, weight loss maintenance and preservation of muscle mass.   She has agreed to: continue to gradually increase the amount and intensity of exercise routine   Pharmacotherapy Continue same regimen.    ASSOCIATED CONDITIONS ADDRESSED TODAY:   Type 2 diabetes mellitus with obesity [facility]) Assessment & Plan: Lab Results  Component Value Date   HGBA1C 5.8 (H) 11/01/2023   HGBA1C 6.1 (H) 07/11/2023   INSULIN  3.2 04/03/2024   INSULIN  15.9 12/27/2022   INSULIN  8.3 03/14/2021    Pt is on a regimen of  Mounjaro  12.5 mg weekly and Metformin  500 mg 0.5 po with lunch and dinner daily with no side effects.  Denies symptoms of hypoglycemia or hyperglycemia. Good control of hunger and cravings.   She brought [employer] blood results dated 02/13/2024. AST  30 and ALT 43. CMP showed Bun Creatinine 25, BUN 20, and Serum Creatinine 0.81. She states she was probably dehydrated prior to the labs. BP at goal.   Continue medications; pt okayed to take Metformin  0.5 tablet once daily if she desires. Encouraged adequate hydration.Continue balanced diet focusing on protein, fruits, and vegetables while limiting simple carbohydrates. Recheck labs.     Hyperlipidemia associated with type 2 diabetes mellitus (HCC) Assessment & Plan: I decreased her Crestor  from 10 mg to 5 mg daily on 11/29/2023 because her lipid panel dated  11/01/2023 was excellent. Continue regimen and heart healthy meal plan. Recheck lipid panel today.     SOB (shortness of breath) on exertion Assessment & Plan: She does feel that she gets out of breath more easily than she used to when she exercises and feels that this would improve with weight loss.  Denies shortness of breath at rest or orthopnea.  Repeat IC completed today to help guide our dietary regimen. It shows a VO2 of 231 and a REE of 1598 (prior REE of 1800 on 12/27/2022).  Her calculated basal metabolic rate is 8619 thus her resting energy expenditure is much better than expected.   Reviewed aspects of diet and lifestyle today with patient that will increase metabolism.  All questions addressed.  Continue to focus on lean proteins, adequate nutrition and advancing cardiovascular and resistance training as tolerated. We slightly adjusted her journaling parameters to 1500-1600 calories and 85+ grams protein daily based on new REE.    Chronic fatigue Assessment & Plan: For the past 2-3 months, she reports feeling more tired even though she's going to the gym and eating well. Will recheck CBC, TSH, B12, and Magnesium today and provide further guidance next OV.    Vitamin D  deficiency Assessment & Plan: Lab Results  Component Value Date   VD25OH 83.4 11/01/2023   VD25OH 56.1 07/11/2023   Currently on OTC VD 2,000 units daily. Continue supplementation; will recheck levels today.     B12 deficiency Assessment & Plan: Last B12 was 1077 on 07/11/2023. On OTC B12 500 mcg every day. Continue regimen. Recheck levels today.     Follow up:   Return 05/29/2024 at 8:20 AM. She was informed of the importance of frequent follow up visits to maximize her success with intensive lifestyle modifications for her multiple health conditions.  MERRELL RETTINGER is aware that we will review all of her lab results at our next visit together in person.  She is aware that if anything is critical/  life threatening with the results, we will be contacting her via MyChart or by my CMA will be calling them prior to the office visit to discuss acute management.      Subjective:   Chief complaint: Obesity Destine is here to discuss her progress with her obesity treatment plan. She is keeping a food journal and adhering to recommended goals of 1200-1300 calories and 90 g of protein and states she is following her eating plan approximately 80% of the time. She states she is doing gym or strength training 60 minutes 5 days per week.  Interval History:  Chalee J Boteler is here for a follow up office visit. Since last OV on 02/07/2024 , she is down 6 lbs. She is happy at her current weight and does not want to lose more. She is not journaling her intake, but endorses increasing her proteins and decreasing simple carbs.    Pharmacotherapy that aid with weight loss: She  is currently taking Mounjaro  12.5 mg weekly and Metformin  500 mg 0.5 po with lunch and dinner daily.    Review of Systems:  Pertinent positives were addressed with patient today.  Reviewed by clinician on day of visit: allergies, medications, problem list, medical history, surgical history, family history, social history, and previous encounter notes.  Weight Summary and Biometrics   Weight Lost Since Last Visit: 6lb  Weight Gained Since Last Visit: 0lb   Vitals Temp: 98.7 F (37.1 C) BP: 100/66 Pulse Rate: 80 SpO2: 99 %   Anthropometric Measurements Height: 5' 5 (1.651 m) Weight: 144 lb (65.3 kg) BMI (Calculated): 23.96 Weight at Last Visit: 150lb Weight Lost Since Last Visit: 6lb Weight Gained Since Last Visit: 0lb Starting Weight: 252lb Peak Weight: 252lb   Body Composition  Body Fat %: 28.2 % Fat Mass (lbs): 40.8 lbs Muscle Mass (lbs): 98.4 lbs Total Body Water (lbs): 66.8 lbs Visceral Fat Rating : 6   Other Clinical Data RMR: 1598 Fasting: Yes Labs: Yes Today's Visit #: 22 Starting Date:  12/27/22    Objective:   PHYSICAL EXAM: Blood pressure 100/66, pulse 80, temperature 98.7 F (37.1 C), height 5' 5 (1.651 m), weight 144 lb (65.3 kg), SpO2 99%. Body mass index is 23.96 kg/m.  General: she is overweight, cooperative and in no acute distress. PSYCH: Has normal mood, affect and thought process.   HEENT: EOMI, sclerae are anicteric. Lungs: Normal breathing effort, no conversational dyspnea. Extremities: Moves * 4 Neurologic: A and O * 3, good insight  DIAGNOSTIC DATA REVIEWED: BMET    Component Value Date/Time   NA 135 (A) 02/13/2024 0000   K 4.2 02/13/2024 0000   CL 97 (A) 02/13/2024 0000   CO2 23 (A) 02/13/2024 0000   GLUCOSE 91 07/11/2023 0904   GLUCOSE 155 (H) 08/17/2022 1016   BUN 20 02/13/2024 0000   CREATININE 0.8 02/13/2024 0000   CREATININE 0.75 07/11/2023 0904   CALCIUM  9.7 02/13/2024 0000   GFRNONAA >60 08/17/2022 1016   GFRAA 113 03/23/2020 1439   Lab Results  Component Value Date   HGBA1C 5.5 04/03/2024   HGBA1C 6.2 11/30/2016   Lab Results  Component Value Date   INSULIN  3.2 04/03/2024   INSULIN  8.7 03/23/2020   Lab Results  Component Value Date   TSH 1.220 04/03/2024   CBC    Component Value Date/Time   WBC 5.4 04/03/2024 0907   WBC 6.8 08/17/2022 1016   RBC 4.52 04/03/2024 0907   RBC 4.59 08/17/2022 1016   HGB 13.2 04/03/2024 0907   HGB 12.5 08/09/2015 1020   HCT 42.1 04/03/2024 0907   HCT 37.8 08/09/2015 1020   PLT 197 04/03/2024 0907   MCV 93 04/03/2024 0907   MCV 86 08/09/2015 1020   MCH 29.2 04/03/2024 0907   MCH 27.9 08/17/2022 1016   MCHC 31.4 (L) 04/03/2024 0907   MCHC 33.1 08/17/2022 1016   RDW 12.9 04/03/2024 0907   RDW 14.3 08/09/2015 1020   Iron Studies No results found for: IRON, TIBC, FERRITIN, IRONPCTSAT Lipid Panel     Component Value Date/Time   CHOL 152 04/03/2024 0907   TRIG 55 04/03/2024 0907   HDL 70 04/03/2024 0907   CHOLHDL 2.2 04/03/2024 0907   CHOLHDL 4 01/22/2020 0906    VLDL 12.4 01/22/2020 0906   LDLCALC 70 04/03/2024 0907   Hepatic Function Panel     Component Value Date/Time   PROT 7.5 07/11/2023 0904   ALBUMIN 4.6 02/13/2024 0000  ALBUMIN 4.6 07/11/2023 0904   AST 30 02/13/2024 0000   ALT 43 (A) 02/13/2024 0000   ALKPHOS 63 02/13/2024 0000   BILITOT 0.4 07/11/2023 0904      Component Value Date/Time   TSH 1.220 04/03/2024 0907   TSH 1.680 12/27/2022 1013   Nutritional Lab Results  Component Value Date   VD25OH 73.7 04/03/2024   VD25OH 83.4 11/01/2023   VD25OH 56.1 07/11/2023    Attestations:   I, Special Puri, acting as a Stage manager for [employer], DO., have compiled all relevant documentation for today's office visit on behalf of Barnie Jenkins, DO, while in the presence of [employer], DO.  I have reviewed the above documentation for accuracy and completeness, and I agree with the above. Barnie JINNY Jenkins, D.O.  The 21st Century Cures Act was signed into law in 2016 which includes the topic of electronic health records.  This provides immediate access to information in MyChart.  This includes consultation notes, operative notes, office notes, lab results and pathology reports.  If you have any questions about what you read please let us  know at your next visit so we can discuss your concerns and take corrective action if need be.  We are right here with you.

## 2024-04-26 ENCOUNTER — Other Ambulatory Visit (HOSPITAL_BASED_OUTPATIENT_CLINIC_OR_DEPARTMENT_OTHER): Payer: Self-pay | Admitting: Family Medicine

## 2024-04-26 DIAGNOSIS — E7849 Other hyperlipidemia: Secondary | ICD-10-CM

## 2024-04-26 DIAGNOSIS — R7303 Prediabetes: Secondary | ICD-10-CM

## 2024-04-26 DIAGNOSIS — Z6838 Body mass index (BMI) 38.0-38.9, adult: Secondary | ICD-10-CM

## 2024-04-26 DIAGNOSIS — Z9189 Other specified personal risk factors, not elsewhere classified: Secondary | ICD-10-CM

## 2024-05-21 LAB — BASIC METABOLIC PANEL WITH GFR
BUN: 17 (ref 4–21)
CO2: 23 — AB (ref 13–22)
Chloride: 103 (ref 99–108)
Creatinine: 0.8 (ref 0.5–1.1)
Glucose: 86
Potassium: 4.9 meq/L (ref 3.5–5.1)
Sodium: 141 (ref 137–147)

## 2024-05-21 LAB — COMPREHENSIVE METABOLIC PANEL WITH GFR
Albumin: 4.4 (ref 3.5–5.0)
Calcium: 9.4 (ref 8.7–10.7)
Globulin: 2
eGFR: 82

## 2024-05-21 LAB — HEPATIC FUNCTION PANEL
ALT: 58 U/L — AB (ref 7–35)
AST: 40 — AB (ref 13–35)
Alkaline Phosphatase: 88 (ref 25–125)

## 2024-05-29 ENCOUNTER — Ambulatory Visit (INDEPENDENT_AMBULATORY_CARE_PROVIDER_SITE_OTHER): Admitting: Family Medicine

## 2024-06-02 ENCOUNTER — Telehealth (HOSPITAL_BASED_OUTPATIENT_CLINIC_OR_DEPARTMENT_OTHER): Payer: Self-pay | Admitting: *Deleted

## 2024-06-02 NOTE — Telephone Encounter (Signed)
 Routing to [place] for review to see if she might be able to get pt in sooner than January for physical.

## 2024-06-02 NOTE — Telephone Encounter (Signed)
 Copied from CRM [id number]. Topic: Appointments - Scheduling Inquiry for Clinic >> Jun 02, 2024  4:34 PM Fonda T wrote: Reason for CRM: Patient/patient representative is calling to schedule an appointment. Refer to attachments for appointment information.   Patient called to scheduled next physical appointment with provider prior to end of the year. Her last physical appointment was on 07/11/23.  Next available appointment for physical at time of call is October 07, 2024. Offered patient appointment, and to be placed on wait list, patient declined to schedule as she states she needs appointment prior to end of year.   Patient requesting to speak to office to attempt to accommodate her request with possibly another provider.  Can be reached at [phone].  Thank you

## 2024-06-04 NOTE — Telephone Encounter (Signed)
 LVM letting patient know that she could call the office back or send mychart msg

## 2024-06-11 ENCOUNTER — Encounter (INDEPENDENT_AMBULATORY_CARE_PROVIDER_SITE_OTHER): Payer: Self-pay

## 2024-06-11 ENCOUNTER — Ambulatory Visit (INDEPENDENT_AMBULATORY_CARE_PROVIDER_SITE_OTHER): Admitting: Family Medicine

## 2024-06-11 ENCOUNTER — Encounter (INDEPENDENT_AMBULATORY_CARE_PROVIDER_SITE_OTHER): Payer: Self-pay | Admitting: Family Medicine

## 2024-06-11 DIAGNOSIS — E538 Deficiency of other specified B group vitamins: Secondary | ICD-10-CM

## 2024-06-11 DIAGNOSIS — E7849 Other hyperlipidemia: Secondary | ICD-10-CM

## 2024-06-11 DIAGNOSIS — E1169 Type 2 diabetes mellitus with other specified complication: Secondary | ICD-10-CM | POA: Diagnosis not present

## 2024-06-11 DIAGNOSIS — Z6822 Body mass index (BMI) 22.0-22.9, adult: Secondary | ICD-10-CM

## 2024-06-11 DIAGNOSIS — Z7985 Long-term (current) use of injectable non-insulin antidiabetic drugs: Secondary | ICD-10-CM

## 2024-06-11 DIAGNOSIS — Z9189 Other specified personal risk factors, not elsewhere classified: Secondary | ICD-10-CM

## 2024-06-11 DIAGNOSIS — E559 Vitamin D deficiency, unspecified: Secondary | ICD-10-CM | POA: Diagnosis not present

## 2024-06-11 DIAGNOSIS — R7303 Prediabetes: Secondary | ICD-10-CM

## 2024-06-11 DIAGNOSIS — Z7984 Long term (current) use of oral hypoglycemic drugs: Secondary | ICD-10-CM

## 2024-06-11 DIAGNOSIS — Z6838 Body mass index (BMI) 38.0-38.9, adult: Secondary | ICD-10-CM

## 2024-06-11 DIAGNOSIS — Z6824 Body mass index (BMI) 24.0-24.9, adult: Secondary | ICD-10-CM

## 2024-06-11 MED ORDER — CYANOCOBALAMIN 500 MCG PO TABS: 500.0000 ug | ORAL_TABLET | ORAL | Status: AC

## 2024-06-11 MED ORDER — VITAMIN D 25 MCG (1000 UNIT) PO TABS: 1000.0000 [IU] | ORAL_TABLET | Freq: Every day | ORAL | Status: AC

## 2024-06-11 MED ORDER — TIRZEPATIDE 12.5 MG/0.5ML ~~LOC~~ SOAJ: 12.5000 mg | SUBCUTANEOUS | 0 refills | Status: DC

## 2024-06-11 NOTE — Progress Notes (Addendum)
 Maria Glenn, D.O.  ABFM, ABOM Specializing in Clinical Bariatric Medicine  Office located at: [address] [place], [place]  [address]   Assessment and Plan:   Medications Discontinued During This Encounter  Medication Reason   cyanocobalamin  (VITAMIN B12) 500 MCG tablet    cholecalciferol  (VITAMIN D3) 25 MCG (1000 UNIT) tablet Reorder   tirzepatide  (MOUNJARO ) 12.5 MG/0.5ML Pen Reorder    Meds ordered this encounter  Medications   cholecalciferol  (VITAMIN D3) 25 MCG (1000 UNIT) tablet    Sig: Take 1 tablet (1,000 Units total) by mouth daily.   cyanocobalamin  (VITAMIN B12) 500 MCG tablet    Sig: Take 1 tablet (500 mcg total) by mouth every other day.   tirzepatide  (MOUNJARO ) 12.5 MG/0.5ML Pen    Sig: Inject 12.5 mg into the skin once a week.    Dispense:  6 mL    Refill:  0   FOR THE DISEASE OF OBESITY:  Morbid obesity-start bmi 40.7/date4/3/24; BMI 24.0-24.9, adult - current BMI 22.96 Assessment & Plan: Since last office visit on 04/03/2024 patient's muscle mass has decreased by 2.8 lbs. Fat mass has decreased by 3.6 lbs. Total body water has decreased by 2.2 lbs.  Body fat % has decreased by 1.2%. Counseling done on how various foods will affect these numbers and how to maximize success.  Total lbs lost to date: 114 lbs  Total weight loss percentage to date: 45.24 %  - Tracking Calories/Macros: no  - Eating More Whole Foods: yes  - Adequate Protein Intake: no  - Adequate Water Intake: yes  - Skipping Meals: yes  - Sleeping 7-9 Hours/ Night: no   Recommended Dietary Goals [name] is currently in the maintenance stage of change. As such, her goal is to continue weight management plan.  She has agreed to: continue food journal with recommended goals of 1500-1600 calories and 85+ grams protein    Behavioral Intervention We discussed the following today: increasing lean protein intake to established goals, decreasing simple carbohydrates , avoiding  skipping meals, increasing water intake , and focusing on food with a 10:1 ratio of calories: grams of protein  Additional resources provided today: None  Evidence-based interventions for health behavior change were utilized today including the discussion of self monitoring techniques, problem-solving barriers and SMART goal setting techniques.   Regarding patient's less desirable eating habits and patterns, we employed the technique of small changes.   Goal(s) for next OV: consuming more protein & implement eating a pre & post-workout snack with a goal of 10:1 ratio of calories to grams of protein.  Recommended Physical Activity Goals [name] has been advised to work up to 300-450 minutes of moderate intensity aerobic activity a week and strengthening exercises 2-3 times per week for cardiovascular health, weight loss maintenance and preservation of muscle mass.   She has agreed to: Think about enjoyable ways to increase daily physical activity and overcoming barriers to exercise, Increase physical activity in their day and reduce sedentary time (increase NEAT)., Increase volume of physical activity to a goal of 240 minutes a week, and Combine aerobic and strengthening exercises for efficiency and improved cardiometabolic health.   Pharmacotherapy We both agreed to: Adequate clinical response to anti-obesity medication, continue current regimen of Metformin  500 mg daily (250 mg BID) and Mounjaro  12.5 mg weekly.    ASSOCIATED CONDITIONS ADDRESSED TODAY:  At risk for impaired metabolic function Assessment & Plan: She is diagnosed with HLD, DM II, and NAFLD, and is at high  risk for heart disease.  Reviewed with patient how these comorbid conditions may affect her weight loss journey  Strategies discussed (inc muscle mass/ exercise etc) along with medications and how they can improve this.   Other hyperlipidemia Assessment & Plan: LDL goal of =/<70; at LOV her LDL was at goal, though had  increased from 51 in February. Cholesterol goal <200, which it has been. She is on lipid-lowering medications: Crestor  10 mg daily. 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  Cardiovascular risk and specific lipid/LDL goals reviewed. [name] agrees to continue with meds and/or our treatment plan of a diet that is low in saturated and trans fats, and low in fatty carbs and exercise with eventual goal of a minimum of 150+ min wk plus 2 days/ week of resistance or strength training We will continue routine screening as patient continues to achieve health goals along their weight loss journey Reviewed liver functions and factors that affect liver health, reminding her to avoid excess EToH or NSAIDs while taking Veozah.  Lab Results  Component Value Date   CHOL 152 04/03/2024   HDL 70 04/03/2024   LDLCALC 70 04/03/2024   TRIG 55 04/03/2024   CHOLHDL 2.2 04/03/2024   Lab Results  Component Value Date   ALT 58 (A) 05/21/2024   AST 40 (A) 05/21/2024   ALKPHOS 88 05/21/2024   BILITOT 0.4 07/11/2023     Type 2 diabetes mellitus with other specified complication, without long-term current use of insulin  [facility])  Assessment & Plan: Goal is HgbA1c < 5.7, Fasting Insulin  =/<5. HgbA1c and FI well-controlled at LOV. Medication: Metformin  250 mg BID; does also take Mounjaro  for weight loss to assist with improving these values.   Reviewed LOV labs while counseling patient on pathophysiology of the disease process of Pre-DM to DM II and stressed the importance of dietary and lifestyle modifications in addition to meds She will continue to focus on protein-rich, low simple carbohydrate foods. We reviewed the importance of hydration, regular exercise for stress reduction, and restorative sleep.  Discussed decreasing Mounjaro  due to condition being well-controlled, however patient declines today and would like to revisit  after the holidays. We will recheck A1c and fasting insulin  level in approximately 3 months from last check, or as deemed appropriate. Refilling Mounjaro  today. Lab Results  Component Value Date   HGBA1C 5.5 04/03/2024   HGBA1C 5.8 (H) 11/01/2023   HGBA1C 6.1 (H) 07/11/2023   INSULIN  3.2 04/03/2024   INSULIN  15.9 12/27/2022   INSULIN  8.3 03/14/2021      Vitamin D  deficiency; B12 deficiency Assessment & Plan: Vitamin-D goal 50-70, Vitamin B12 230 - 1,250. At LOV, her Vitamin-D was elevated above goal but still WNL, but her Vitamin B12 was elevated by about 400 points. She takes Vitamin-D 2000 units daily and Vitamin-B12 500 mcg every other day.  Reviewed labs with patient; will recheck when deemed necessary. Refilling Vitamin-D and B12, DECREASING vitamin-D to 1000 units daily.  Lab Results  Component Value Date   VD25OH 73.7 04/03/2024   VD25OH 83.4 11/01/2023   VITAMINB12 1,662 (H) 04/03/2024   VITAMINB12 1,077 07/11/2023   Follow up:   Return 09/03/2024 arrive @ 8:00 AM for 8:20 appt. She was informed of the importance of frequent follow up visits to maximize her success with intensive lifestyle modifications for her multiple health conditions.  Subjective:    Chief complaint: Obesity Maria Glenn is here to  discuss her progress with her obesity treatment plan. She is keeping a food journal and adhering to recommended goals of 1500-1600 calories and 85+ grams protein and states she is following her eating plan approximately 85% of the time. Pt is doing cardio 60 minutes 5 days per week.    Interval History:  Kada J Height is here for a follow up office visit. Pt has experienced a weight loss of 6 LBS since last OV on 04/03/2024.     Pharmacotherapy that aid with weight loss: She is currently taking Metformin  500 mg daily (250 mg BID) and Mounjaro  12.5 mg weekly - both with adequate clinical response and without side effects.    Review of Systems:  Pertinent positives were  addressed with patient today.  Reviewed by clinician on day of visit: allergies, medications, problem list, medical history, surgical history, family history, social history, and previous encounter notes.  Weight Summary and Biometrics   Weight Lost Since Last Visit: 6lb  Weight Gained Since Last Visit: 0lb   Vitals Temp: 98.1 F (36.7 C) BP: 115/76 Pulse Rate: 72 SpO2: 98 %   Anthropometric Measurements Height: 5' 5 (1.651 m) Weight: 138 lb (62.6 kg) BMI (Calculated): 22.96 Weight at Last Visit: 144lb Weight Lost Since Last Visit: 6lb Weight Gained Since Last Visit: 0lb Starting Weight: 252lb Total Weight Loss (lbs): 114 lb (51.7 kg)   Body Composition  Body Fat %: 27 % Fat Mass (lbs): 37.2 lbs Muscle Mass (lbs): 95.6 lbs Total Body Water (lbs): 64.6 lbs Visceral Fat Rating : 5   Other Clinical Data Fasting: yes Labs: no Today's Visit #: 23 Starting Date: 01/06/23    Objective:   PHYSICAL EXAM: Blood pressure 115/76, pulse 72, temperature 98.1 F (36.7 C), height 5' 5 (1.651 m), weight 138 lb (62.6 kg), SpO2 98%. Body mass index is 22.96 kg/m.  General: she is overweight, cooperative and in no acute distress. PSYCH: Has normal mood, affect and thought process.   HEENT: EOMI, sclerae are anicteric. Lungs: Normal breathing effort, no conversational dyspnea. Extremities: Moves * 4 Neurologic: A and O * 3, good insight  DIAGNOSTIC DATA REVIEWED: BMET    Component Value Date/Time   NA 135 (A) 02/13/2024 0000   K 4.2 02/13/2024 0000   CL 97 (A) 02/13/2024 0000   CO2 23 (A) 02/13/2024 0000   GLUCOSE 91 07/11/2023 0904   GLUCOSE 155 (H) 08/17/2022 1016   BUN 20 02/13/2024 0000   CREATININE 0.8 02/13/2024 0000   CREATININE 0.75 07/11/2023 0904   CALCIUM  9.7 02/13/2024 0000   GFRNONAA >60 08/17/2022 1016   GFRAA 113 03/23/2020 1439   Lab Results  Component Value Date   HGBA1C 5.5 04/03/2024   HGBA1C 6.2 11/30/2016   Lab Results  Component  Value Date   INSULIN  3.2 04/03/2024   INSULIN  8.7 03/23/2020   Lab Results  Component Value Date   TSH 1.220 04/03/2024   CBC    Component Value Date/Time   WBC 5.4 04/03/2024 0907   WBC 6.8 08/17/2022 1016   RBC 4.52 04/03/2024 0907   RBC 4.59 08/17/2022 1016   HGB 13.2 04/03/2024 0907   HGB 12.5 08/09/2015 1020   HCT 42.1 04/03/2024 0907   HCT 37.8 08/09/2015 1020   PLT 197 04/03/2024 0907   MCV 93 04/03/2024 0907   MCV 86 08/09/2015 1020   MCH 29.2 04/03/2024 0907   MCH 27.9 08/17/2022 1016   MCHC 31.4 (L) 04/03/2024 0907   MCHC 33.1 08/17/2022  1016   RDW 12.9 04/03/2024 0907   RDW 14.3 08/09/2015 1020   Iron Studies No results found for: IRON, TIBC, FERRITIN, IRONPCTSAT Lipid Panel     Component Value Date/Time   CHOL 152 04/03/2024 0907   TRIG 55 04/03/2024 0907   HDL 70 04/03/2024 0907   CHOLHDL 2.2 04/03/2024 0907   CHOLHDL 4 01/22/2020 0906   VLDL 12.4 01/22/2020 0906   LDLCALC 70 04/03/2024 0907   Hepatic Function Panel     Component Value Date/Time   PROT 7.5 07/11/2023 0904   ALBUMIN 4.6 02/13/2024 0000   ALBUMIN 4.6 07/11/2023 0904   AST 30 02/13/2024 0000   ALT 43 (A) 02/13/2024 0000   ALKPHOS 63 02/13/2024 0000   BILITOT 0.4 07/11/2023 0904      Component Value Date/Time   TSH 1.220 04/03/2024 0907   TSH 1.680 12/27/2022 1013   Nutritional Lab Results  Component Value Date   VD25OH 73.7 04/03/2024   VD25OH 83.4 11/01/2023   VD25OH 56.1 07/11/2023    Attestations:   I, Damien Blanks, acting as a Stage manager for Maria Jenkins, DO., have compiled all relevant documentation for today's office visit on behalf of Maria Jenkins, DO, while in the presence of [employer], DO.  I have reviewed the above documentation for accuracy and completeness, and I agree with the above. Maria JINNY Glenn, D.O.  The 21st Century Cures Act was signed into law in 2016 which includes the topic of electronic health records.  This provides  immediate access to information in MyChart.  This includes consultation notes, operative notes, office notes, lab results and pathology reports.  If you have any questions about what you read please let us  know at your next visit so we can discuss your concerns and take corrective action if need be.  We are right here with you.

## 2024-06-19 ENCOUNTER — Telehealth (HOSPITAL_BASED_OUTPATIENT_CLINIC_OR_DEPARTMENT_OTHER): Payer: Self-pay | Admitting: *Deleted

## 2024-06-19 NOTE — Telephone Encounter (Signed)
**Note De-identified  [place] Obfuscation** Please advise 

## 2024-06-19 NOTE — Telephone Encounter (Signed)
 Copied from CRM [id number]. Topic: Clinical - Medication Question >> Jun 19, 2024 10:30 AM Nathanel BROCKS wrote: Reason for CRM: pt is going to be traveling in November and she has a blood cloting issue. Last time she traveled Dr Everitt [place] gave her xarelto . Please advise if he can do this again.

## 2024-06-23 ENCOUNTER — Encounter (HOSPITAL_BASED_OUTPATIENT_CLINIC_OR_DEPARTMENT_OTHER): Payer: Self-pay | Admitting: *Deleted

## 2024-06-23 NOTE — Telephone Encounter (Signed)
 LVM for patient to call the office / mychart msg sent

## 2024-06-26 ENCOUNTER — Encounter (HOSPITAL_BASED_OUTPATIENT_CLINIC_OR_DEPARTMENT_OTHER): Payer: Self-pay | Admitting: Family Medicine

## 2024-06-26 ENCOUNTER — Telehealth (HOSPITAL_BASED_OUTPATIENT_CLINIC_OR_DEPARTMENT_OTHER): Admitting: Family Medicine

## 2024-06-26 DIAGNOSIS — D6851 Activated protein C resistance: Secondary | ICD-10-CM | POA: Diagnosis not present

## 2024-06-26 MED ORDER — RIVAROXABAN 10 MG PO TABS: 10.0000 mg | ORAL_TABLET | Freq: Every day | ORAL | 1 refills | Status: AC

## 2024-06-26 NOTE — Progress Notes (Signed)
   Virtual Visit   I connected with  Maria Glenn  on 06/26/24 by telehealth and verified that I am speaking with the correct person using two identifiers. Visit completed via video.  I discussed the limitations, risks, security and privacy concerns of performing an evaluation and management service by telephone, including the higher likelihood of inaccurate diagnosis and treatment, and the availability of in person appointments.  We also discussed the likely need of an additional face to face encounter for complete and high quality delivery of care.  I also discussed with the patient that there may be a patient responsible charge related to this service. The patient expressed understanding and wishes to proceed.  Provider location is in medical facility. Patient location is at their home, different from provider location. People involved in care of the patient during this telehealth encounter were myself, my nurse/medical assistant, and my front office/scheduling team member.  Review of Systems: No fevers, chills, night sweats, weight loss, chest pain, or shortness of breath.   Objective Findings:    General: Speaking full sentences, no audible heavy breathing.  Sounds alert and appropriately interactive.  Independent interpretation of tests performed by another provider:   None.  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  She presents today as she will be having a long flight upcoming and has utilized anticoagulant to lower risk of developing blood clot.  Previously she has used Xarelto  temporarily around her planned flights.  She will also utilize compression stockings as well as regular ambulation.  She does take baby aspirin twice daily at recommendation of hematologist who she has had evaluation with in the  past. Discussed considerations related to DVT prophylaxis given heterozygous factor V Leiden mutation.  Discussed that data regarding this is limited.  She has not had any bleeding issues in the past when utilizing anticoagulant.  We discussed considerations and primary importance of compression stockings and trying to minimize prolonged times of immobility.  She would like to proceed with prophylactic anticoagulant which is reasonable.  We will utilize 10 mg dose of Xarelto  once daily around planned long distance flights.  Prescription has been sent to pharmacy.  I discussed the above assessment and treatment plan with the patient. The patient was provided an opportunity to ask questions and all were answered. The patient agreed with the plan and demonstrated an understanding of the instructions.  The patient was advised to call back or seek an in-person evaluation if the symptoms worsen or if the condition fails to improve as anticipated.  I provided 13 minutes of face to face and non-face-to-face time during this encounter date, time was needed to gather information, review chart, records, communicate/coordinate with staff remotely, as well as complete documentation.   ___________________________________________ Jonna Dittrich de [place], MD, ABFM, CAQSM Primary Care and Sports Medicine [facility]

## 2024-06-26 NOTE — Assessment & Plan Note (Signed)
 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  She presents today as she will be having a long flight upcoming and has utilized anticoagulant to lower risk of developing blood clot.  Previously she has used Xarelto  temporarily around her planned flights.  She will also utilize compression stockings as well as regular ambulation.  She does take baby aspirin twice daily at recommendation of hematologist who she has had evaluation with in the past. Discussed considerations related to DVT prophylaxis given heterozygous factor V Leiden mutation.  Discussed that data regarding this is limited.  She has not had any bleeding issues in the past when utilizing anticoagulant.  We discussed considerations and primary importance of compression stockings and trying to minimize prolonged times of immobility.  She would like to proceed with prophylactic anticoagulant which is reasonable.  We will utilize 10 mg dose of Xarelto  once daily around planned long distance flights.  Prescription has been sent to pharmacy.

## 2024-07-12 ENCOUNTER — Other Ambulatory Visit: Payer: Self-pay

## 2024-07-12 ENCOUNTER — Encounter (HOSPITAL_BASED_OUTPATIENT_CLINIC_OR_DEPARTMENT_OTHER): Payer: Self-pay | Admitting: Emergency Medicine

## 2024-07-12 ENCOUNTER — Emergency Department (HOSPITAL_BASED_OUTPATIENT_CLINIC_OR_DEPARTMENT_OTHER)
Admission: EM | Admit: 2024-07-12 | Discharge: 2024-07-12 | Disposition: A | Discharge status: Home / Self Care | Attending: Emergency Medicine | Admitting: Emergency Medicine

## 2024-07-12 ENCOUNTER — Emergency Department (HOSPITAL_BASED_OUTPATIENT_CLINIC_OR_DEPARTMENT_OTHER)

## 2024-07-12 DIAGNOSIS — M79662 Pain in left lower leg: Secondary | ICD-10-CM | POA: Diagnosis present

## 2024-07-12 DIAGNOSIS — Z7982 Long term (current) use of aspirin: Secondary | ICD-10-CM | POA: Insufficient documentation

## 2024-07-12 DIAGNOSIS — Z7901 Long term (current) use of anticoagulants: Secondary | ICD-10-CM | POA: Diagnosis not present

## 2024-07-12 DIAGNOSIS — M79605 Pain in left leg: Secondary | ICD-10-CM

## 2024-07-12 NOTE — ED Provider Notes (Signed)
 [place] EMERGENCY DEPARTMENT AT [facility] Provider Note   CSN: [id number] Arrival date & time: 07/12/24  1551     Patient presents with: Leg Pain   Maria Glenn is a 58 y.o. female.  58 year old female presents to ED for DVT rule out.  Patient has had DVT in the past and was placed on Xarelto  for 6 months.  Patient was found to have factor V Leiden.  Patient reports she only uses Xarelto  now for travel.  Patient takes 2 daily aspirin and that she has not had any more issues with DVT since.  Patient reports she has had a pain in her left calf for 5 days now.  Patient reports she has noticed the pain when she is walking to the grocery store and also when she sits down.  There is no obvious exacerbating factors.  Patient reports she is going to the [facility] tomorrow and wants to make sure she does not have a clot.  Patient reports she is adherent to compression stockings.     Prior to Admission medications   Medication Sig Start Date End Date Taking? Authorizing Provider  Accu-Chek Softclix Lancets lancets 1 each in the morning, at noon, and at bedtime. 02/22/23   Midge Sober, DO  aspirin 81 MG chewable tablet Chew 81 mg by mouth daily.    [provider]  BIOTIN 5000 PO Take 5,000 mg by mouth daily.    [provider]  Blood Glucose Monitoring Suppl (BLOOD GLUCOSE MONITOR SYSTEM) w/Device KIT Use as directed to test in the morning, at noon, and at bedtime. 02/22/23   Opalski, Sober, DO  cholecalciferol  (VITAMIN D3) 25 MCG (1000 UNIT) tablet Take 1 tablet (1,000 Units total) by mouth daily. 06/11/24   Opalski, Sober, DO  cyanocobalamin  (VITAMIN B12) 500 MCG tablet Take 1 tablet (500 mcg total) by mouth every other day. 06/11/24   Opalski, Sober, DO  loratadine (CLARITIN REDITABS) 10 MG dissolvable tablet Take 10 mg by mouth daily.    [provider]  metFORMIN  (GLUCOPHAGE ) 500 MG tablet 1/2 po with lunch and dinner daily 04/03/24   Opalski,  Sober, DO  rivaroxaban  (XARELTO ) 10 MG TABS tablet Take 1 tablet (10 mg total) by mouth daily. 06/26/24   de [place], Raymond J, MD  rosuvastatin  (CRESTOR ) 10 MG tablet Take 1 tablet by mouth once daily 04/28/24   de [place], Quintin JINNY, MD  SYNTHROID  88 MCG tablet Take 1 tablet by mouth once daily 04/03/24   de [place], Quintin JINNY, MD  tirzepatide  (MOUNJARO ) 12.5 MG/0.5ML Pen Inject 12.5 mg into the skin once a week. 06/11/24   Opalski, Deborah, DO  VEOZAH 45 MG TABS Take 1 tablet by mouth daily. 08/21/23   [provider]    Allergies: Penicillins    Review of Systems  Musculoskeletal:  Positive for myalgias.  All other systems reviewed and are negative.   Updated Vital Signs BP 118/67 (BP Location: Right Arm)   Pulse 71   Temp 98.5 F (36.9 C)   Resp 17   Ht 5' 5 (1.651 m)   Wt 65.8 kg   SpO2 98%   BMI 24.13 kg/m   Physical Exam Vitals and nursing note reviewed.  Constitutional:      Appearance: Normal appearance.  HENT:     Head: Normocephalic and atraumatic.     Nose: Nose normal.  Eyes:     Extraocular Movements: Extraocular movements intact.     Conjunctiva/sclera: Conjunctivae normal.  Cardiovascular:  Rate and Rhythm: Normal rate.  Pulmonary:     Effort: Pulmonary effort is normal. No respiratory distress.  Abdominal:     General: Abdomen is flat.     Tenderness: There is no guarding.  Musculoskeletal:        General: Tenderness present. No swelling, deformity or signs of injury. Normal range of motion.     Cervical back: Normal range of motion.     Right lower leg: No edema.     Left lower leg: No edema.  Skin:    General: Skin is warm.     Capillary Refill: Capillary refill takes less than 2 seconds.     Coloration: Skin is not jaundiced or pale.     Findings: No bruising, erythema, lesion or rash.  Neurological:     General: No focal deficit present.     Mental Status: She is alert.     Cranial Nerves: No cranial nerve deficit.     Sensory: No  sensory deficit.     Motor: No weakness.  Psychiatric:        Mood and Affect: Mood normal.        Behavior: Behavior normal.     (all labs ordered are listed, but only abnormal results are displayed) Labs Reviewed - No data to display  EKG: None  Radiology: US  Venous Img Lower Unilateral Left Result Date: 07/12/2024 CLINICAL DATA:  Left calf burning. Concern for DVT. History of factor V Leiden. EXAM: Left LOWER EXTREMITY VENOUS DOPPLER ULTRASOUND TECHNIQUE: Gray-scale sonography with compression, as well as color and duplex ultrasound, were performed to evaluate the deep venous system(s) from the level of the common femoral vein through the popliteal and proximal calf veins. COMPARISON:  None Available. FINDINGS: VENOUS Normal compressibility of the common femoral, superficial femoral, and popliteal veins, as well as the visualized calf veins. Visualized portions of profunda femoral vein and great saphenous vein unremarkable. No filling defects to suggest DVT on grayscale or color Doppler imaging. Doppler waveforms show normal direction of venous flow, normal respiratory plasticity and response to augmentation. Limited views of the contralateral common femoral vein are unremarkable. OTHER None. Limitations: none IMPRESSION: Negative. Electronically Signed   By: Vanetta Chou M.D.   On: 07/12/2024 17:45     Procedures   Medications Ordered in the ED - No data to display  58 y.o. female presents to the ED with complaints of left calf pain x 5 days, this involves an extensive number of treatment options, and is a complaint that carries with it a high risk of complications and morbidity.  The differential diagnosis includes DVT, musculoskeletal pain, muscle cramping, septic joint, venous insufficiency, acute limb ischemia, (Ddx)  On arrival pt is nontoxic, vitals unremarkable. Exam significant for pain to palpation on bilateral calfs  Additional history obtained from patient history  significant for taking Xarelto  during long trips.  Imaging Studies ordered:  I ordered imaging studies which included DVT rule out, I independently visualized and interpreted imaging which showed negative for DVT  ED Course:   58 year old female presents to ED for DVT rule out.  Patient reports history of DVT and she takes daily aspirin and Xarelto  as needed.  Patient was found to have factor V Leiden.  On exam patient is sitting comfortably in ED bed in no acute distress nontoxic-appearing.  Patient has no signs of redness, swelling, warmth to either extremity.  Patient has full range of motion without pain.  Patient has no pain to palpation  to the joint.  Pulses present bilaterally.  Septic joint is not suspected and no fractures suspected.  Patient is able to stand and walk without difficulty or pain.  Pain was reported pain to palpation of bilateral calves.  Patient reports the pain is random and there is no obvious exacerbating factors.  Patient is able to exercise without pain and has not had difficulties with ambulation or standing.  Ultrasound was negative for DVT.  DVT is not suspected there is no other concerning findings on exam.  Patient was advised this may be musculoskeletal pain or cramping pain.  Patient was advised to monitor for worsening symptoms or signs of DVT or swelling.  Patient was advised to return for further evaluation if any of these symptoms occurred.  Patient was comfortable with discharge and agreed to treatment plan.  Portions of this note were generated with Scientist, clinical (histocompatibility and immunogenetics). Dictation errors may occur despite best attempts at proofreading.   Final diagnoses:  Left leg pain    ED Discharge Orders     None          Myriam Fonda RAMAN, [place] 07/13/24 1723    Bari Roxie HERO, DO 07/13/24 2340

## 2024-07-12 NOTE — ED Notes (Signed)
 Pt ambulatory to restroom w/o difficulty. RN provided specimen cup in case a sample is needed.

## 2024-07-12 NOTE — Discharge Instructions (Signed)
 Exam and ultrasound was reassuring for no evidence of DVT today.  It is advised to continue to use compression stockings and use Xarelto  as prescribed.  Please monitor for any worsening or concerning symptoms.  If any concerning symptoms arise please return to ED for further evaluation.

## 2024-07-12 NOTE — ED Triage Notes (Signed)
 Pt c/o 5 days of left posterior calf pain, worse when sitting down. Pt has Factor V Leiden and hx DVT. She is concerned that she may have a clot now. Leg is not swollen, red, or warm but is tender to touch.

## 2024-07-15 LAB — COMPREHENSIVE METABOLIC PANEL WITH GFR
Albumin: 4.6 (ref 3.5–5.0)
Calcium: 10 (ref 8.7–10.7)
Globulin: 2.3
eGFR: 101

## 2024-07-15 LAB — BASIC METABOLIC PANEL WITH GFR
BUN: 23 — AB (ref 4–21)
CO2: 25 — AB (ref 13–22)
Chloride: 99 (ref 99–108)
Creatinine: 0.7 (ref 0.5–1.1)
Glucose: 79
Potassium: 4.4 meq/L (ref 3.5–5.1)
Sodium: 137 (ref 137–147)

## 2024-07-15 LAB — HEPATIC FUNCTION PANEL
ALT: 58 U/L — AB (ref 7–35)
AST: 35 (ref 13–35)
Alkaline Phosphatase: 65 (ref 25–125)
Bilirubin, Total: 0.5

## 2024-07-16 ENCOUNTER — Other Ambulatory Visit (HOSPITAL_BASED_OUTPATIENT_CLINIC_OR_DEPARTMENT_OTHER): Payer: Self-pay | Admitting: Obstetrics and Gynecology

## 2024-07-16 DIAGNOSIS — Z1231 Encounter for screening mammogram for malignant neoplasm of breast: Secondary | ICD-10-CM

## 2024-07-27 ENCOUNTER — Other Ambulatory Visit (HOSPITAL_BASED_OUTPATIENT_CLINIC_OR_DEPARTMENT_OTHER): Payer: Self-pay | Admitting: Family Medicine

## 2024-07-27 DIAGNOSIS — E038 Other specified hypothyroidism: Secondary | ICD-10-CM

## 2024-07-30 ENCOUNTER — Ambulatory Visit (HOSPITAL_BASED_OUTPATIENT_CLINIC_OR_DEPARTMENT_OTHER): Admitting: Family Medicine

## 2024-07-31 LAB — OPHTHALMOLOGY REPORT-SCANNED

## 2024-08-25 ENCOUNTER — Other Ambulatory Visit (INDEPENDENT_AMBULATORY_CARE_PROVIDER_SITE_OTHER): Payer: Self-pay | Admitting: Family Medicine

## 2024-08-25 DIAGNOSIS — E1169 Type 2 diabetes mellitus with other specified complication: Secondary | ICD-10-CM

## 2024-09-03 ENCOUNTER — Ambulatory Visit (INDEPENDENT_AMBULATORY_CARE_PROVIDER_SITE_OTHER): Payer: Self-pay | Admitting: Family Medicine

## 2024-09-03 ENCOUNTER — Encounter (INDEPENDENT_AMBULATORY_CARE_PROVIDER_SITE_OTHER): Payer: Self-pay | Admitting: Family Medicine

## 2024-09-03 DIAGNOSIS — Z6821 Body mass index (BMI) 21.0-21.9, adult: Secondary | ICD-10-CM

## 2024-09-03 DIAGNOSIS — Z7985 Long-term (current) use of injectable non-insulin antidiabetic drugs: Secondary | ICD-10-CM | POA: Diagnosis not present

## 2024-09-03 DIAGNOSIS — E785 Hyperlipidemia, unspecified: Secondary | ICD-10-CM

## 2024-09-03 DIAGNOSIS — E1169 Type 2 diabetes mellitus with other specified complication: Secondary | ICD-10-CM

## 2024-09-03 DIAGNOSIS — E669 Obesity, unspecified: Secondary | ICD-10-CM

## 2024-09-03 MED ORDER — METFORMIN HCL 500 MG PO TABS: ORAL_TABLET | ORAL | Status: AC

## 2024-09-03 MED ORDER — TIRZEPATIDE 10 MG/0.5ML ~~LOC~~ SOAJ: 10.0000 mg | SUBCUTANEOUS | 0 refills | Status: AC

## 2024-09-03 NOTE — Progress Notes (Signed)
 Maria Glenn, D.O.  ABFM, ABOM Specializing in Clinical Bariatric Medicine  Office located at: [address] [place], [place]  [address]    FOR THE CHRONIC DISEASE OF OBESITY:   Morbid obesity-start bmi 40.7/date4/3/24 BMI 21.0-21.9, adult - current BMI 21.64  Weight Summary and Body Composition Analysis  Weight Lost Since Last Visit: 4 lb  Weight Gained Since Last Visit: 0    Vitals Temp: 98.2 F (36.8 C) BP: (!) 102/57 Pulse Rate: 66 SpO2: 96 %   Anthropometric Measurements Height: 5' 6 (1.676 m) (Height rechecked today) Weight: 134 lb (60.8 kg) BMI (Calculated): 21.64 Weight at Last Visit: 138 lb Weight Lost Since Last Visit: 4 lb Weight Gained Since Last Visit: 0 Starting Weight: 252 lb Total Weight Loss (lbs): 118 lb (53.5 kg) Peak Weight: 252 lb   Body Composition  Body Fat %: 22.7 % Fat Mass (lbs): 30.6 lbs Muscle Mass (lbs): 98.6 lbs Total Body Water (lbs): 64.8 lbs Visceral Fat Rating : 5   Other Clinical Data Fasting: No Labs: No Today's Visit #: 24 Starting Date: 12/27/22 Comments: Height Rechecked today    Chief complaint: Obesity  Interval History Maria Glenn is here for a follow-up office visit to discuss her progress with her obesity treatment plan. She is keeping a food journal and adhering to recommended goals of 1500-1600 calories and 85+ grams protein and states she is following her eating plan approximately 85 % of the time. She is doing cardio and strength training 90  minutes 5 days per week  She has experienced a weight loss of 4 lbs since last OV on 06/11/2024.   Her dietary and life habits include:  - Tracking Calories/Macros: not tracking at this time  - Eating More Whole Foods: yes  - Adequate Protein Intake: yes  - Adequate Water Intake: yes  - Skipping Meals: yes  - Sleeping 7-9 Hours/ Night: no    06/11/24 11:00 09/03/24 08:00   Body Fat % 27 % 22.7 %  Muscle Mass (lbs) 95.6 lbs 98.6 lbs   Fat Mass (lbs) 37.2 lbs 30.6 lbs  Total Body Water (lbs) 64.6 lbs 64.8 lbs  Visceral Fat Rating  5 5   Counseling done on how various foods will affect these numbers and how to maximize success  Total Fat mass lost to date: - 85.4 lbs Total lbs lost to date: - 118 lbs Total weight loss percentage to date: - 46.83 %   Nutritional and Behavioral Counseling:  We discussed the following today: continue to work on maintaining a reduced calorie state, getting the recommended amount of protein, incorporating whole foods, making healthy choices, staying well hydrated and practicing mindfulness when eating.  Additional resources provided today: Handout on Common Characteristics of Successful Weight Losers and Maintainers   Evidence-based interventions for health behavior change were utilized today including the discussion of self monitoring techniques, problem-solving barriers and SMART goal setting techniques.   Regarding patient's less desirable eating habits and patterns, we employed the technique of small changes.   SMART Goal(s) created today: maintain weight over holidays    Recommended Dietary Goals [name] is currently in the action stage of change. As such, her goal is to continue weight management plan.  She has agreed to continue journaling 1500-1600 calories and 85+ grams.   Recommended Physical Activity Goals [name] has been advised to work up to 300-450 minutes of moderate intensity aerobic activity a week and strengthening exercises 2-3 times per week for  cardiovascular health, weight loss maintenance and preservation of muscle mass.   She has agreed to: Continue current level of physical activity    Medical Interventions and Pharmacotherapy Previous Bariatric surgery: n/a Pharmacotherapy: See T2DM note.   OBESITY RELATED CONDITIONS ADDRESSED TODAY:   Medications Discontinued During This Encounter  Medication Reason   tirzepatide  (MOUNJARO ) 12.5 MG/0.5ML Pen     metFORMIN  (GLUCOPHAGE ) 500 MG tablet      Meds ordered this encounter  Medications   tirzepatide  (MOUNJARO ) 10 MG/0.5ML Pen    Sig: Inject 10 mg into the skin once a week.    Dispense:  6 mL    Refill:  0   metFORMIN  (GLUCOPHAGE ) 500 MG tablet    Sig: 1/2 po with dinner daily    30 d supply;  ** OV for RF **   Do not send RF request     Type 2 diabetes mellitus associated with morbid obesity (HCC) Assessment & Plan: Lab Results  Component Value Date   HGBA1C 5.5 04/03/2024   HGBA1C 5.8 (H) 11/01/2023   HGBA1C 6.1 (H) 07/11/2023   INSULIN  3.2 04/03/2024   INSULIN  15.9 12/27/2022   INSULIN  8.3 03/14/2021   HgbA1c at goal for age and comorbid conditions. Denies symptoms of hypoglycemia or hyperglycemia. On Mounjaro  12.5 mg weekly and Metformin  500 mg 1/2 tablet with dinner daily. Hunger and cravings are controlled. Shared decision making: DECREASE Mounjaro  to 10 mg weekly since she is having nausea for 2-3 days after shot is given and because she is doing very well with life style modifications and continued weight loss. Cont balanced diet focusing on protein, fruits, and vegetables while limiting simple carbohydrates.     Hyperlipidemia associated with type 2 diabetes mellitus [facility]) Assessment & Plan: Lab Results  Component Value Date   CHOL 152 04/03/2024   HDL 70 04/03/2024   LDLCALC 70 04/03/2024   TRIG 55 04/03/2024   CHOLHDL 2.2 04/03/2024   HLD managed with Rosuvastatin  5 mg daily. Most recent lipid panel within acceptable ranges. Cont statin therapy and working on nutrition plan - decreasing simple carbohydrates, increasing lean proteins, decreasing saturated fats and cholesterol , avoiding trans fats and exercise as able.   Objective:   PHYSICAL EXAM: Blood pressure (!) 102/57, pulse 66, temperature 98.2 F (36.8 C), height 5' 6 (1.676 m), weight 134 lb (60.8 kg), SpO2 96%. Body mass index is 21.63 kg/m.  General: she is overweight, cooperative and in no acute  distress. PSYCH: Has normal mood, affect and thought process.   HEENT: EOMI, sclerae are anicteric. Lungs: Normal breathing effort, no conversational dyspnea. Extremities: Moves * 4 Neurologic: A and O * 3, good insight  DIAGNOSTIC DATA REVIEWED: BMET    Component Value Date/Time   NA 141 05/21/2024 1741   K 4.9 05/21/2024 1741   CL 103 05/21/2024 1741   CO2 23 (A) 05/21/2024 1741   GLUCOSE 91 07/11/2023 0904   GLUCOSE 155 (H) 08/17/2022 1016   BUN 17 05/21/2024 1741   CREATININE 0.8 05/21/2024 1741   CREATININE 0.75 07/11/2023 0904   CALCIUM  9.4 05/21/2024 1741   GFRNONAA >60 08/17/2022 1016   GFRAA 113 03/23/2020 1439   Lab Results  Component Value Date   HGBA1C 5.5 04/03/2024   HGBA1C 6.2 11/30/2016   Lab Results  Component Value Date   INSULIN  3.2 04/03/2024   INSULIN  8.7 03/23/2020   Lab Results  Component Value Date   TSH 1.220 04/03/2024   CBC    Component  Value Date/Time   WBC 5.4 04/03/2024 0907   WBC 6.8 08/17/2022 1016   RBC 4.52 04/03/2024 0907   RBC 4.59 08/17/2022 1016   HGB 13.2 04/03/2024 0907   HGB 12.5 08/09/2015 1020   HCT 42.1 04/03/2024 0907   HCT 37.8 08/09/2015 1020   PLT 197 04/03/2024 0907   MCV 93 04/03/2024 0907   MCV 86 08/09/2015 1020   MCH 29.2 04/03/2024 0907   MCH 27.9 08/17/2022 1016   MCHC 31.4 (L) 04/03/2024 0907   MCHC 33.1 08/17/2022 1016   RDW 12.9 04/03/2024 0907   RDW 14.3 08/09/2015 1020   Iron Studies No results found for: IRON, TIBC, FERRITIN, IRONPCTSAT Lipid Panel     Component Value Date/Time   CHOL 152 04/03/2024 0907   TRIG 55 04/03/2024 0907   HDL 70 04/03/2024 0907   CHOLHDL 2.2 04/03/2024 0907   CHOLHDL 4 01/22/2020 0906   VLDL 12.4 01/22/2020 0906   LDLCALC 70 04/03/2024 0907   Hepatic Function Panel     Component Value Date/Time   PROT 7.5 07/11/2023 0904   ALBUMIN 4.4 05/21/2024 1741   ALBUMIN 4.6 07/11/2023 0904   AST 40 (A) 05/21/2024 1741   ALT 58 (A) 05/21/2024 1741    ALKPHOS 88 05/21/2024 1741   BILITOT 0.4 07/11/2023 0904      Component Value Date/Time   TSH 1.220 04/03/2024 0907   TSH 1.680 12/27/2022 1013   Nutritional Lab Results  Component Value Date   VD25OH 73.7 04/03/2024   VD25OH 83.4 11/01/2023   VD25OH 56.1 07/11/2023    Follow up:   Return 12/03/2024 at 8:20 AM.  She was informed of the importance of frequent follow up visits to maximize her success with intensive lifestyle modifications for her multiple health conditions.   Attestations:   I, Special Puri, acting as a stage manager for [employer], DO., have compiled all relevant documentation for today's office visit on behalf of Maria Jenkins, DO, while in the presence of [employer], DO.  Pertinent positives were addressed with patient today. Reviewed by clinician on day of visit: allergies, medications, problem list, medical history, surgical history, family history, social history, and previous encounter notes.  I have reviewed the above documentation for accuracy and completeness, and I agree with the above. Maria Glenn, D.O.  The 21st Century Cures Act was signed into law in 2016 which includes the topic of electronic health records.  This provides immediate access to information in MyChart. This includes consultation notes, operative notes, office notes, lab results and pathology reports.  If you have any questions about what you read please let us  know at your next visit so we can discuss your concerns and take corrective action if need be.  We are right here with you.

## 2024-09-04 ENCOUNTER — Telehealth (HOSPITAL_BASED_OUTPATIENT_CLINIC_OR_DEPARTMENT_OTHER): Payer: Self-pay

## 2024-09-04 DIAGNOSIS — Z0000 Encounter for general adult medical examination without abnormal findings: Secondary | ICD-10-CM

## 2024-09-04 NOTE — Telephone Encounter (Signed)
 Copied from CRM #[id number]. Topic: Clinical - Request for Lab/Test Order >> Sep 02, 2024  9:12 AM Treva T wrote: Reason for CRM: Pt calling, states she is scheduled fro her annual appt with provider on 09/10/24.  Pt is requesting to have fasting labs drawn prior to that physical appt.  Pt can be reached for follow up call if she is able to have labs drawn, at [phone].  Pt aware of call back >> Sep 04, 2024 12:42 PM Rachelle R wrote: Patient calling to check the status of her request for lab orders. Would like to schedule prior to her physical  Patient can be reached at [phone]

## 2024-09-05 ENCOUNTER — Ambulatory Visit: Admitting: Family Medicine

## 2024-09-05 ENCOUNTER — Encounter: Payer: Self-pay | Admitting: Family Medicine

## 2024-09-05 VITALS — BP 124/78 | HR 78 | Temp 97.5°F | Ht 66.0 in | Wt 143.6 lb

## 2024-09-05 DIAGNOSIS — J0100 Acute maxillary sinusitis, unspecified: Secondary | ICD-10-CM

## 2024-09-05 MED ORDER — DOXYCYCLINE HYCLATE 100 MG PO TABS: 100.0000 mg | ORAL_TABLET | Freq: Two times a day (BID) | ORAL | 0 refills | Status: AC

## 2024-09-05 NOTE — Addendum Note (Signed)
 Addended by: DE CUBA, QUINTIN J on: 09/05/2024 02:19 PM   Modules accepted: Orders

## 2024-09-05 NOTE — Progress Notes (Signed)
 Acute Office Visit  Subjective:     Patient ID: Maria Glenn, female    DOB: 19-Jul-1966, 58 y.o.   MRN: [id number]  Chief Complaint  Patient presents with   Sinus Problem    Headache and cough onset over the weekend    Sinus Problem   Discussed the use of AI scribe software for clinical note transcription with the patient, who gave verbal consent to proceed.  History of Present Illness   Maria Glenn is a 58 year old female who presents with sore throat and sinus congestion.  Symptoms started six days ago with malaise and scratchy throat, progressing to nasal congestion and facial pain from clogged sinuses. She has mild headache but no severe headache, fever, chills, body aches, weakness, shortness of breath, or chest pain. Sore throat has resolved, but sinus congestion persists with slight recent improvement.  She has only taken NyQuil for sleep and has not used other treatments or home COVID testing. She recently traveled to [place] .  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 She is allergic to penicillins. She notes mild ear pain that has improved.       Review of Systems  All other systems reviewed and are negative.       Objective:    BP 124/78 (BP Location: Left Arm, Patient Position: Sitting, Cuff Size: Normal)   Pulse 78   Temp (!) 97.5 F (36.4 C) (Oral)   Ht 5' 6 (1.676 m)   Wt 143 lb 9.6 oz (65.1 kg)   SpO2 99%   BMI 23.18 kg/m    Physical Exam Vitals reviewed.  Constitutional:      Appearance: Normal appearance. She is normal weight.  HENT:     Right Ear: Tympanic membrane normal.     Left Ear: Tympanic membrane normal.     Nose: Congestion present.     Right Sinus: Maxillary sinus tenderness present.     Left Sinus: Maxillary sinus tenderness present.     Mouth/Throat:     Mouth: Mucous membranes are moist.     Pharynx: Oropharyngeal exudate and posterior oropharyngeal erythema present.   Cardiovascular:     Rate and Rhythm: Normal rate and regular rhythm.     Heart sounds: Normal heart sounds. No murmur heard. Pulmonary:     Effort: Pulmonary effort is normal.     Breath sounds: Normal breath sounds. No wheezing.  Neurological:     Mental Status: She is alert.     No results found for any visits on 09/05/24.      Assessment & Plan:   Problem List Items Addressed This Visit   None Visit Diagnoses       Acute non-recurrent maxillary sinusitis    -  Primary   Relevant Medications   doxycycline  (VIBRA -TABS) 100 MG tablet     Assessment and Plan    Acute maxillary sinusitis Facial pain and congestion, likely secondary to bacterial overgrowth following a viral upper respiratory infection. Throat inflammation with white patches suggests possible streptococcal infection. No significant lymphadenopathy. Lungs are clear, and heart sounds are normal. Allergic to penicillins, so doxycycline  is chosen for treatment. Doxycycline  is effective for sinus infections and streptococcal infections, with good penetration into the sinuses. Nausea is a potential side effect, mitigated by taking it with food. - Prescribed doxycycline  100 mg, one tablet twice a day for 7 days. - Advised taking doxycycline  with food to  minimize nausea. - Continue using over-the-counter medications like NyQuil for symptomatic relief. - Instructed to contact the clinic if experiencing severe side effects from doxycycline .        Meds ordered this encounter  Medications   doxycycline  (VIBRA -TABS) 100 MG tablet    Sig: Take 1 tablet (100 mg total) by mouth 2 (two) times daily for 7 days.    Dispense:  14 tablet    Refill:  0    No follow-ups on file.  Heron CHRISTELLA Sharper, MD

## 2024-09-10 ENCOUNTER — Encounter (HOSPITAL_BASED_OUTPATIENT_CLINIC_OR_DEPARTMENT_OTHER): Admitting: Family Medicine

## 2024-09-10 ENCOUNTER — Encounter (HOSPITAL_BASED_OUTPATIENT_CLINIC_OR_DEPARTMENT_OTHER): Payer: Self-pay | Admitting: Family Medicine

## 2024-09-10 VITALS — BP 126/72 | HR 88 | Temp 97.9°F | Resp 20 | Ht 66.0 in | Wt 142.0 lb

## 2024-09-10 DIAGNOSIS — Z23 Encounter for immunization: Secondary | ICD-10-CM

## 2024-09-10 DIAGNOSIS — E038 Other specified hypothyroidism: Secondary | ICD-10-CM

## 2024-09-10 DIAGNOSIS — E119 Type 2 diabetes mellitus without complications: Secondary | ICD-10-CM

## 2024-09-10 DIAGNOSIS — Z0000 Encounter for general adult medical examination without abnormal findings: Secondary | ICD-10-CM

## 2024-09-10 DIAGNOSIS — Z7984 Long term (current) use of oral hypoglycemic drugs: Secondary | ICD-10-CM

## 2024-09-10 MED ORDER — SYNTHROID 88 MCG PO TABS: 88.0000 ug | ORAL_TABLET | Freq: Every day | ORAL | 3 refills | Status: AC

## 2024-09-10 NOTE — Assessment & Plan Note (Signed)

## 2024-09-10 NOTE — Progress Notes (Signed)
 Subjective:    CC: Annual Physical Exam  HPI:  Maria Glenn is a 58 y.o. presenting for annual physical  I reviewed the past medical history, family history, social history, surgical history, and allergies today and no changes were needed.  Please see the problem list section below in epic for further details.  Past Medical History: Past Medical History:  Diagnosis Date   Allergy    At risk for dehydration 02/14/2021   B12 deficiency    BMI 38.0-38.9,adult 12/13/2020   Class 2 severe obesity with serious comorbidity and body mass index (BMI) of 39.0 to 39.9 in adult 06/09/2020   Clotting disorder    factor V Leiden   DVT (deep venous thrombosis) (HCC)    Factor V Leiden    Fatty liver    History of blood clots    History of DVT (deep vein thrombosis) 08/09/2015   Hyperlipidemia    Hypothyroidism    Left leg DVT (HCC) 08/09/2015   Non-recurrent acute allergic otitis media of right ear 12/13/2020   Other fatigue 12/07/2020   Other hyperlipidemia 12/07/2020   Other specified hypothyroidism 12/07/2020   Prediabetes    Sinobronchitis 12/13/2020   SOBOE (shortness of breath on exertion)    SOBOE (shortness of breath on exertion) 12/07/2020   Thyroid  disease    Trigeminal neuralgia    Visit for preventive health examination 11/30/2016   Vitamin D  deficiency    Past Surgical History: Past Surgical History:  Procedure Laterality Date   CESAREAN SECTION     Social History: Social History   Socioeconomic History   Marital status: Married    Spouse name: Ethan Kasperski   Number of children: 1   Years of education: 12   Highest education level: Not on file  Occupational History   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  Tobacco Use   Smoking status: Never   Smokeless tobacco: Never  Vaping Use   Vaping status: Never Used  Substance and Sexual Activity   Alcohol use: No    Alcohol/week: 0.0 standard drinks of alcohol   Drug use: No   Sexual activity: Yes    Birth  control/protection: Other-see comments    Comment: husband had vasectomy  Other Topics Concern   Not on file  Social History Narrative   Right handed   Two story home   Drinks caffeine   Social Drivers of Health   Tobacco Use: Low Risk (09/10/2024)   Patient History    Smoking Tobacco Use: Never    Smokeless Tobacco Use: Never    Passive Exposure: Not on file  Financial Resource Strain: Low Risk (07/11/2023)   Overall Financial Resource Strain (CARDIA)    Difficulty of Paying Living Expenses: Not hard at all  Food Insecurity: No Food Insecurity (07/11/2023)   Hunger Vital Sign    Worried About Radiation Protection Practitioner of Food in the Last Year: Never true    Ran Out of Food in the Last Year: Never true  Transportation Needs: No Transportation Needs (07/11/2023)   PRAPARE - Administrator, Civil Service (Medical): No    Lack of Transportation (Non-Medical): No  Physical Activity: Sufficiently Active (07/11/2023)   Exercise Vital Sign    Days of Exercise per Week: 5 days    Minutes of Exercise per Session: 40 min  Stress: No Stress Concern Present (07/11/2023)   [employer] of Occupational Health - Occupational Stress Questionnaire    Feeling of Stress : Not at all  Social Connections: Moderately Isolated (07/11/2023)   Social Connection and Isolation Panel    Frequency of Communication with Friends and Family: More than three times a week    Frequency of Social Gatherings with Friends and Family: More than three times a week    Attends Religious Services: Never    Database Administrator or Organizations: No    Attends Banker Meetings: Never    Marital Status: Married  Depression (PHQ2-9): Low Risk (06/26/2024)   Depression (PHQ2-9)    PHQ-2 Score: 0  Alcohol Screen: Low Risk (07/11/2023)   Alcohol Screen    Last Alcohol Screening Score (AUDIT): 0  Housing: Low Risk (07/11/2023)   Housing    Last Housing Risk Score: 0  Utilities: Not At Risk  (07/11/2023)   AHC Utilities    Threatened with loss of utilities: No  Health Literacy: Adequate Health Literacy (07/11/2023)   B1300 Health Literacy    Frequency of need for help with medical instructions: Never   Family History: Family History  Problem Relation Age of Onset   Hypertension Mother    Hyperlipidemia Mother    Cancer Father        Lung   Pancreatic cancer Sister    Colon cancer Neg Hx    Colon polyps Neg Hx    Esophageal cancer Neg Hx    Stomach cancer Neg Hx    Rectal cancer Neg Hx    Allergies: Allergies[1] Medications: See med rec.  Review of Systems: No headache, visual changes, nausea, vomiting, diarrhea, constipation, dizziness, abdominal pain, skin rash, fevers, chills, night sweats, swollen lymph nodes, weight loss, chest pain, body aches, joint swelling, muscle aches, shortness of breath, mood changes, visual or auditory hallucinations.  Objective:    BP 126/72 (BP Location: Right Arm, Patient Position: Sitting, Cuff Size: Normal)   Pulse 88   Temp 97.9 F (36.6 C) (Oral)   Resp 20   Ht 5' 6 (1.676 m)   Wt 142 lb (64.4 kg)   SpO2 100%   BMI 22.92 kg/m   General: Well Developed, well nourished, and in no acute distress.  Neuro: Alert and oriented x3, extra-ocular muscles intact, sensation grossly intact. Cranial nerves II through XII are intact, motor, sensory, and coordinative functions are all intact. HEENT: Normocephalic, atraumatic, pupils equal round reactive to light, neck supple, no masses, no lymphadenopathy, thyroid  nonpalpable. Oropharynx, nasopharynx, external ear canals are unremarkable. Skin: Warm and dry, no rashes noted.  Cardiac: Regular rate and rhythm, no murmurs rubs or gallops.  Respiratory: Clear to auscultation bilaterally. Not using accessory muscles, speaking in full sentences. Abdominal: Soft, nontender, nondistended, positive bowel sounds, no masses, no organomegaly. Musculoskeletal: Shoulder, elbow, wrist, hip, knee,  ankle stable, and with full range of motion.  Impression and Recommendations:    Wellness examination Assessment & Plan: Routine HCM labs ordered. HCM reviewed/discussed. Anticipatory guidance regarding healthy weight, lifestyle and choices given. Recommend healthy diet.  Recommend approximately 150 minutes/week of moderate intensity exercise Recommend regular dental and vision exams Always use seatbelt/lap and shoulder restraints Recommend using smoke alarms and checking batteries at least twice a year Recommend using sunscreen when outside Discussed immunization recommendations  Orders: -     CBC with Differential/Platelet -     Comprehensive metabolic panel with GFR -     Hemoglobin A1c -     Lipid panel -     VITAMIN D  25 Hydroxy (Vit-D Deficiency, Fractures) -     Vitamin B12 -  TSH Rfx on Abnormal to Free T4 -     Microalbumin / creatinine urine ratio  Other specified hypothyroidism -     Synthroid ; Take 1 tablet (88 mcg total) by mouth daily before breakfast.  Dispense: 90 tablet; Refill: 3  Type 2 diabetes mellitus without complication, without long-term current use of insulin  (HCC) -     Microalbumin / creatinine urine ratio  Return in about 4 months (around 01/09/2025) for diabetes.   ___________________________________________ Evangelene Vora de [place], MD, ABFM, CAQSM Primary Care and Sports Medicine [facility]    [1]  Allergies Allergen Reactions   Penicillins Shortness Of Breath

## 2024-09-12 LAB — VITAMIN D 25 HYDROXY (VIT D DEFICIENCY, FRACTURES): Vit D, 25-Hydroxy: 63.1 ng/mL (ref 30.0–100.0)

## 2024-09-12 LAB — COMPREHENSIVE METABOLIC PANEL WITH GFR
ALT: 50 IU/L — ABNORMAL HIGH (ref 0–32)
AST: 31 IU/L (ref 0–40)
Albumin: 4.5 g/dL (ref 3.8–4.9)
Alkaline Phosphatase: 70 IU/L (ref 49–135)
BUN/Creatinine Ratio: 30 — ABNORMAL HIGH (ref 9–23)
BUN: 21 mg/dL (ref 6–24)
Bilirubin Total: 0.3 mg/dL (ref 0.0–1.2)
CO2: 24 mmol/L (ref 20–29)
Calcium: 9.7 mg/dL (ref 8.7–10.2)
Chloride: 100 mmol/L (ref 96–106)
Creatinine, Ser: 0.71 mg/dL (ref 0.57–1.00)
Globulin, Total: 2.1 g/dL (ref 1.5–4.5)
Glucose: 92 mg/dL (ref 70–99)
Potassium: 5.1 mmol/L (ref 3.5–5.2)
Sodium: 138 mmol/L (ref 134–144)
Total Protein: 6.6 g/dL (ref 6.0–8.5)
eGFR: 100 mL/min/1.73

## 2024-09-12 LAB — CBC WITH DIFFERENTIAL/PLATELET
Basophils Absolute: 0 x10E3/uL (ref 0.0–0.2)
Basos: 1 %
EOS (ABSOLUTE): 0.2 x10E3/uL (ref 0.0–0.4)
Eos: 3 %
Hematocrit: 39.3 % (ref 34.0–46.6)
Hemoglobin: 12.8 g/dL (ref 11.1–15.9)
Immature Grans (Abs): 0 x10E3/uL (ref 0.0–0.1)
Immature Granulocytes: 0 %
Lymphocytes Absolute: 2.5 x10E3/uL (ref 0.7–3.1)
Lymphs: 47 %
MCH: 29.4 pg (ref 26.6–33.0)
MCHC: 32.6 g/dL (ref 31.5–35.7)
MCV: 90 fL (ref 79–97)
Monocytes Absolute: 0.4 x10E3/uL (ref 0.1–0.9)
Monocytes: 7 %
Neutrophils Absolute: 2.3 x10E3/uL (ref 1.4–7.0)
Neutrophils: 42 %
Platelets: 240 x10E3/uL (ref 150–450)
RBC: 4.35 x10E6/uL (ref 3.77–5.28)
RDW: 12.9 % (ref 11.7–15.4)
WBC: 5.4 x10E3/uL (ref 3.4–10.8)

## 2024-09-12 LAB — MICROALBUMIN / CREATININE URINE RATIO
Creatinine, Urine: 52.6 mg/dL
Microalb/Creat Ratio: 12 mg/g{creat} (ref 0–29)
Microalbumin, Urine: 6.1 ug/mL

## 2024-09-12 LAB — HEMOGLOBIN A1C
Est. average glucose Bld gHb Est-mCnc: 114 mg/dL
Hgb A1c MFr Bld: 5.6 % (ref 4.8–5.6)

## 2024-09-12 LAB — TSH RFX ON ABNORMAL TO FREE T4: TSH: 1.09 u[IU]/mL (ref 0.450–4.500)

## 2024-09-12 LAB — VITAMIN B12: Vitamin B-12: 1442 pg/mL — ABNORMAL HIGH (ref 232–1245)

## 2024-09-12 LAB — LIPID PANEL
Chol/HDL Ratio: 2.2 ratio (ref 0.0–4.4)
Cholesterol, Total: 148 mg/dL (ref 100–199)
HDL: 68 mg/dL
LDL Chol Calc (NIH): 68 mg/dL (ref 0–99)
Triglycerides: 59 mg/dL (ref 0–149)
VLDL Cholesterol Cal: 12 mg/dL (ref 5–40)

## 2024-10-10 ENCOUNTER — Emergency Department (HOSPITAL_BASED_OUTPATIENT_CLINIC_OR_DEPARTMENT_OTHER)
Admission: EM | Admit: 2024-10-10 | Discharge: 2024-10-10 | Disposition: A | Discharge status: Home / Self Care | Attending: Emergency Medicine | Admitting: Emergency Medicine

## 2024-10-10 ENCOUNTER — Other Ambulatory Visit: Payer: Self-pay

## 2024-10-10 ENCOUNTER — Encounter (HOSPITAL_BASED_OUTPATIENT_CLINIC_OR_DEPARTMENT_OTHER): Payer: Self-pay

## 2024-10-10 DIAGNOSIS — K0401 Reversible pulpitis: Secondary | ICD-10-CM | POA: Diagnosis not present

## 2024-10-10 DIAGNOSIS — K0889 Other specified disorders of teeth and supporting structures: Secondary | ICD-10-CM | POA: Diagnosis present

## 2024-10-10 MED ORDER — OXYCODONE HCL 5 MG PO TABS
5.0000 mg | ORAL_TABLET | Freq: Once | ORAL | Status: AC
  Administered 2024-10-10: 5 mg via ORAL
  Filled 2024-10-10: qty 1

## 2024-10-10 MED ORDER — KETOROLAC TROMETHAMINE 15 MG/ML IJ SOLN
30.0000 mg | Freq: Once | INTRAMUSCULAR | Status: AC
  Administered 2024-10-10: 30 mg via INTRAMUSCULAR
  Filled 2024-10-10: qty 2

## 2024-10-10 MED ORDER — CLINDAMYCIN HCL 300 MG PO CAPS: 300.0000 mg | ORAL_CAPSULE | Freq: Three times a day (TID) | ORAL | 0 refills | Status: AC

## 2024-10-10 MED ORDER — CLINDAMYCIN HCL 150 MG PO CAPS
300.0000 mg | ORAL_CAPSULE | Freq: Once | ORAL | Status: AC
  Administered 2024-10-10: 300 mg via ORAL
  Filled 2024-10-10: qty 2

## 2024-10-10 MED ORDER — CELECOXIB 200 MG PO CAPS: 200.0000 mg | ORAL_CAPSULE | Freq: Two times a day (BID) | ORAL | 0 refills | Status: AC

## 2024-10-10 NOTE — ED Triage Notes (Signed)
 Arrives POV. Seen by dentist and had xrays done yesterday fro R sided tooth pain. Told to take aspirin for the pain, not helping. Took tylenol prior to arrival.

## 2024-10-10 NOTE — ED Provider Notes (Signed)
 " Maria Glenn   CSN: [id number] Arrival date & time: 10/10/24  0153     History No chief complaint on file.   HPI Maria Glenn is a 59 y.o. female presenting for chief complaint of dental pain. Has TGN of the left side but states this feels very different from that. Saw dentistry yesterday who started her on ASA and said to come back on Monday Now sensitive to heat and improves with cold.  Patient's recorded medical, surgical, social, medication list and allergies were reviewed in the Snapshot window as part of the initial history.   Review of Systems   Review of Systems  Constitutional:  Negative for chills and fever.  HENT:  Negative for ear pain and sore throat.   Eyes:  Negative for pain and visual disturbance.  Respiratory:  Negative for cough and shortness of breath.   Cardiovascular:  Negative for chest pain and palpitations.  Gastrointestinal:  Negative for abdominal pain and vomiting.  Genitourinary:  Negative for dysuria and hematuria.  Musculoskeletal:  Negative for arthralgias and back pain.  Skin:  Negative for color change and rash.  Neurological:  Negative for seizures and syncope.  All other systems reviewed and are negative.   Physical Exam Updated Vital Signs BP 130/82   Pulse 68   Temp 97.8 F (36.6 C)   Resp 20   SpO2 100%  Physical Exam Constitutional:      General: She is not in acute distress.    Appearance: She is not ill-appearing or toxic-appearing.  HENT:     Head: Normocephalic and atraumatic.  Eyes:     Extraocular Movements: Extraocular movements intact.     Pupils: Pupils are equal, round, and reactive to light.  Cardiovascular:     Rate and Rhythm: Normal rate.  Pulmonary:     Effort: No respiratory distress.  Abdominal:     General: Abdomen is flat.  Musculoskeletal:        General: No swelling, deformity or signs of injury.     Cervical back: Normal range of motion.  No rigidity.  Skin:    General: Skin is warm and dry.  Neurological:     General: No focal deficit present.     Mental Status: She is alert and oriented to person, place, and time.  Psychiatric:        Mood and Affect: Mood normal.      ED Course/ Medical Decision Making/ A&P    Procedures Procedures   Medications Ordered in ED Medications  ketorolac  (TORADOL ) 15 MG/ML injection 30 mg (30 mg Intramuscular Given 10/10/24 0224)  oxyCODONE  (Oxy IR/ROXICODONE ) immediate release tablet 5 mg (5 mg Oral Given 10/10/24 0224)  clindamycin  (CLEOCIN ) capsule 300 mg (300 mg Oral Given 10/10/24 0224)   Medical Decision Making:   Maria Glenn is a 59 y.o. female who presented to the ED today with dental pain detailed above.    Complete initial physical exam performed, notably the patient was HDS in no acute distress. No obvious intraoral lesions or swelling. Poor dentition diffusely    Reviewed and confirmed nursing documentation for past medical history, family history, social history.    Initial Assessment:   With the patient's presentation of dental pain, most likely diagnosis is reversible vs irreversible pulpitis. Other diagnoses were considered including (but not limited to) ludwig's angina, osteitis, dental abscess, PTA, RPA. These are considered less likely due to history of present illness and  physical exam findings.   This is most consistent with an acute complicated illness  Initial Plan:  Symptomatic management with NSAIDS/Tylenol Due to clinical overlap with potentially reversible pulpitis, antibiotics prescribed Patient will need definitive management with dentistry. Provided low cost/local dental resource chart provided by social work.  Disposition:  I have considered need for hospitalization, however, considering all of the above, I believe this patient is stable for discharge at this time.  Patient/family educated about specific return precautions for given chief  complaint and symptoms.  Patient/family educated about follow-up with PCP and dentistry.    Patient/family expressed understanding of return precautions and need for follow-up. Patient spoken to regarding all imaging and laboratory results and appropriate follow up for these results. All education provided in verbal form with additional information in written form. Time was allowed for answering of patient questions. Patient discharged.       Clinical Impression:  1. Pulpitis      Discharge   Final Clinical Impression(s) / ED Diagnoses Final diagnoses:  Pulpitis    Rx / DC Orders ED Discharge Orders          Ordered    clindamycin  (CLEOCIN ) 300 MG capsule  3 times daily        10/10/24 0219    celecoxib  (CELEBREX ) 200 MG capsule  2 times daily        10/10/24 9780              Jerral Meth, MD 10/10/24 [phone]  "

## 2024-12-03 ENCOUNTER — Ambulatory Visit (INDEPENDENT_AMBULATORY_CARE_PROVIDER_SITE_OTHER): Admitting: Family Medicine

## 2025-01-09 ENCOUNTER — Ambulatory Visit (HOSPITAL_BASED_OUTPATIENT_CLINIC_OR_DEPARTMENT_OTHER): Admitting: Family Medicine
